# Patient Record
Sex: Female | Born: 1970 | Race: White | Hispanic: No | Smoking: Never smoker
Health system: Southern US, Community
[De-identification: ages and names within clinical notes are randomized; demographics above are authoritative.]

## PROBLEM LIST (undated history)

## (undated) DIAGNOSIS — K859 Acute pancreatitis without necrosis or infection, unspecified: Secondary | ICD-10-CM

## (undated) DIAGNOSIS — O139 Gestational [pregnancy-induced] hypertension without significant proteinuria, unspecified trimester: Secondary | ICD-10-CM

## (undated) DIAGNOSIS — K469 Unspecified abdominal hernia without obstruction or gangrene: Secondary | ICD-10-CM

## (undated) DIAGNOSIS — F3181 Bipolar II disorder: Secondary | ICD-10-CM

## (undated) DIAGNOSIS — M5030 Other cervical disc degeneration, unspecified cervical region: Secondary | ICD-10-CM

## (undated) DIAGNOSIS — K3184 Gastroparesis: Secondary | ICD-10-CM

## (undated) DIAGNOSIS — K1379 Other lesions of oral mucosa: Secondary | ICD-10-CM

## (undated) DIAGNOSIS — K219 Gastro-esophageal reflux disease without esophagitis: Secondary | ICD-10-CM

## (undated) DIAGNOSIS — M5126 Other intervertebral disc displacement, lumbar region: Secondary | ICD-10-CM

## (undated) DIAGNOSIS — J45909 Unspecified asthma, uncomplicated: Secondary | ICD-10-CM

## (undated) DIAGNOSIS — IMO0002 Reserved for concepts with insufficient information to code with codable children: Secondary | ICD-10-CM

## (undated) DIAGNOSIS — K56600 Partial intestinal obstruction, unspecified as to cause: Secondary | ICD-10-CM

## (undated) DIAGNOSIS — E079 Disorder of thyroid, unspecified: Secondary | ICD-10-CM

## (undated) DIAGNOSIS — K589 Irritable bowel syndrome without diarrhea: Secondary | ICD-10-CM

## (undated) DIAGNOSIS — G44209 Tension-type headache, unspecified, not intractable: Secondary | ICD-10-CM

## (undated) HISTORY — PX: GASTRIC BYPASS: SHX52

## (undated) HISTORY — PX: ENDOMETRIAL ABLATION: SHX621

## (undated) HISTORY — PX: TUBAL LIGATION: SHX77

## (undated) HISTORY — PX: BACK SURGERY: SHX140

## (undated) HISTORY — PX: CHOLECYSTECTOMY: SHX55

## (undated) HISTORY — PX: HERNIA REPAIR: SHX51

---

## 1997-09-03 ENCOUNTER — Other Ambulatory Visit: Admission: RE | Admit: 1997-09-03 | Discharge: 1997-09-03 | Payer: Self-pay | Admitting: Obstetrics and Gynecology

## 1997-10-14 ENCOUNTER — Encounter: Admission: RE | Admit: 1997-10-14 | Discharge: 1998-01-12 | Payer: Self-pay | Admitting: Obstetrics and Gynecology

## 1997-12-31 ENCOUNTER — Other Ambulatory Visit: Admission: RE | Admit: 1997-12-31 | Discharge: 1997-12-31 | Payer: Self-pay | Admitting: Gynecology

## 1998-02-03 ENCOUNTER — Inpatient Hospital Stay (HOSPITAL_COMMUNITY): Admission: AD | Admit: 1998-02-03 | Discharge: 1998-02-03 | Payer: Self-pay | Admitting: Obstetrics and Gynecology

## 1998-02-23 ENCOUNTER — Inpatient Hospital Stay (HOSPITAL_COMMUNITY): Admission: AD | Admit: 1998-02-23 | Discharge: 1998-02-23 | Payer: Self-pay | Admitting: Obstetrics and Gynecology

## 1998-03-12 ENCOUNTER — Inpatient Hospital Stay (HOSPITAL_COMMUNITY): Admission: AD | Admit: 1998-03-12 | Discharge: 1998-03-18 | Payer: Self-pay | Admitting: Obstetrics and Gynecology

## 1998-03-18 ENCOUNTER — Encounter (HOSPITAL_COMMUNITY): Admission: RE | Admit: 1998-03-18 | Discharge: 1998-06-16 | Payer: Self-pay | Admitting: Gynecology

## 1998-03-19 ENCOUNTER — Observation Stay (HOSPITAL_COMMUNITY): Admission: AD | Admit: 1998-03-19 | Discharge: 1998-03-20 | Payer: Self-pay | Admitting: Gynecology

## 1998-04-13 ENCOUNTER — Other Ambulatory Visit: Admission: RE | Admit: 1998-04-13 | Discharge: 1998-04-13 | Payer: Self-pay | Admitting: Gynecology

## 1999-05-13 ENCOUNTER — Inpatient Hospital Stay (HOSPITAL_COMMUNITY): Admission: EM | Admit: 1999-05-13 | Discharge: 1999-05-15 | Payer: Self-pay | Admitting: Emergency Medicine

## 1999-05-14 ENCOUNTER — Encounter: Payer: Self-pay | Admitting: General Surgery

## 1999-05-14 ENCOUNTER — Encounter: Payer: Self-pay | Admitting: Endocrinology

## 1999-05-19 ENCOUNTER — Encounter: Payer: Self-pay | Admitting: Internal Medicine

## 1999-05-19 ENCOUNTER — Ambulatory Visit (HOSPITAL_COMMUNITY): Admission: RE | Admit: 1999-05-19 | Discharge: 1999-05-19 | Payer: Self-pay | Admitting: Internal Medicine

## 1999-06-08 ENCOUNTER — Encounter: Payer: Self-pay | Admitting: Gastroenterology

## 1999-06-08 ENCOUNTER — Ambulatory Visit (HOSPITAL_COMMUNITY): Admission: RE | Admit: 1999-06-08 | Discharge: 1999-06-08 | Payer: Self-pay | Admitting: Gastroenterology

## 1999-06-25 ENCOUNTER — Other Ambulatory Visit: Admission: RE | Admit: 1999-06-25 | Discharge: 1999-06-25 | Payer: Self-pay | Admitting: Obstetrics and Gynecology

## 1999-06-30 ENCOUNTER — Encounter (INDEPENDENT_AMBULATORY_CARE_PROVIDER_SITE_OTHER): Payer: Self-pay | Admitting: Specialist

## 1999-06-30 ENCOUNTER — Ambulatory Visit (HOSPITAL_COMMUNITY): Admission: RE | Admit: 1999-06-30 | Discharge: 1999-06-30 | Payer: Self-pay | Admitting: Gastroenterology

## 1999-12-08 ENCOUNTER — Emergency Department (HOSPITAL_COMMUNITY): Admission: EM | Admit: 1999-12-08 | Discharge: 1999-12-08 | Payer: Self-pay | Admitting: Emergency Medicine

## 2000-04-24 ENCOUNTER — Encounter (INDEPENDENT_AMBULATORY_CARE_PROVIDER_SITE_OTHER): Payer: Self-pay

## 2000-04-24 ENCOUNTER — Ambulatory Visit (HOSPITAL_COMMUNITY): Admission: RE | Admit: 2000-04-24 | Discharge: 2000-04-24 | Payer: Self-pay | Admitting: Obstetrics and Gynecology

## 2000-07-10 ENCOUNTER — Other Ambulatory Visit: Admission: RE | Admit: 2000-07-10 | Discharge: 2000-07-10 | Payer: Self-pay | Admitting: Obstetrics and Gynecology

## 2000-10-15 ENCOUNTER — Encounter: Payer: Self-pay | Admitting: Emergency Medicine

## 2000-10-15 ENCOUNTER — Emergency Department (HOSPITAL_COMMUNITY): Admission: EM | Admit: 2000-10-15 | Discharge: 2000-10-15 | Payer: Self-pay | Admitting: Emergency Medicine

## 2001-07-05 ENCOUNTER — Ambulatory Visit: Admission: RE | Admit: 2001-07-05 | Discharge: 2001-07-05 | Payer: Self-pay | Admitting: Internal Medicine

## 2001-09-04 ENCOUNTER — Other Ambulatory Visit: Admission: RE | Admit: 2001-09-04 | Discharge: 2001-09-04 | Payer: Self-pay | Admitting: Gynecology

## 2001-09-13 ENCOUNTER — Ambulatory Visit (HOSPITAL_BASED_OUTPATIENT_CLINIC_OR_DEPARTMENT_OTHER): Admission: RE | Admit: 2001-09-13 | Discharge: 2001-09-13 | Payer: Self-pay | Admitting: *Deleted

## 2001-10-05 ENCOUNTER — Encounter: Admission: RE | Admit: 2001-10-05 | Discharge: 2002-01-03 | Payer: Self-pay | Admitting: Gynecology

## 2001-11-15 ENCOUNTER — Inpatient Hospital Stay (HOSPITAL_COMMUNITY): Admission: AD | Admit: 2001-11-15 | Discharge: 2001-11-15 | Payer: Self-pay | Admitting: Gynecology

## 2001-12-27 ENCOUNTER — Inpatient Hospital Stay (HOSPITAL_COMMUNITY): Admission: AD | Admit: 2001-12-27 | Discharge: 2001-12-27 | Payer: Self-pay | Admitting: Gynecology

## 2002-01-16 ENCOUNTER — Inpatient Hospital Stay (HOSPITAL_COMMUNITY): Admission: AD | Admit: 2002-01-16 | Discharge: 2002-01-16 | Payer: Self-pay | Admitting: Internal Medicine

## 2002-01-24 ENCOUNTER — Inpatient Hospital Stay (HOSPITAL_COMMUNITY): Admission: AD | Admit: 2002-01-24 | Discharge: 2002-01-24 | Payer: Self-pay | Admitting: Gynecology

## 2002-01-28 ENCOUNTER — Encounter (HOSPITAL_COMMUNITY): Admission: RE | Admit: 2002-01-28 | Discharge: 2002-02-14 | Payer: Self-pay | Admitting: Gynecology

## 2002-02-13 ENCOUNTER — Ambulatory Visit (HOSPITAL_COMMUNITY): Admission: RE | Admit: 2002-02-13 | Discharge: 2002-02-13 | Payer: Self-pay | Admitting: Gynecology

## 2002-02-14 ENCOUNTER — Encounter (INDEPENDENT_AMBULATORY_CARE_PROVIDER_SITE_OTHER): Payer: Self-pay | Admitting: *Deleted

## 2002-02-14 ENCOUNTER — Inpatient Hospital Stay (HOSPITAL_COMMUNITY): Admission: AD | Admit: 2002-02-14 | Discharge: 2002-02-18 | Payer: Self-pay | Admitting: Gynecology

## 2002-02-18 ENCOUNTER — Emergency Department (HOSPITAL_COMMUNITY): Admission: EM | Admit: 2002-02-18 | Discharge: 2002-02-19 | Payer: Self-pay | Admitting: Emergency Medicine

## 2002-02-18 ENCOUNTER — Encounter: Payer: Self-pay | Admitting: Emergency Medicine

## 2002-02-20 ENCOUNTER — Emergency Department (HOSPITAL_COMMUNITY): Admission: EM | Admit: 2002-02-20 | Discharge: 2002-02-20 | Payer: Self-pay | Admitting: Emergency Medicine

## 2002-04-02 ENCOUNTER — Other Ambulatory Visit: Admission: RE | Admit: 2002-04-02 | Discharge: 2002-04-02 | Payer: Self-pay | Admitting: Gynecology

## 2002-12-14 ENCOUNTER — Encounter: Payer: Self-pay | Admitting: *Deleted

## 2002-12-14 ENCOUNTER — Ambulatory Visit (HOSPITAL_COMMUNITY): Admission: RE | Admit: 2002-12-14 | Discharge: 2002-12-14 | Payer: Self-pay | Admitting: *Deleted

## 2002-12-27 ENCOUNTER — Encounter: Payer: Self-pay | Admitting: Emergency Medicine

## 2002-12-27 ENCOUNTER — Emergency Department (HOSPITAL_COMMUNITY): Admission: EM | Admit: 2002-12-27 | Discharge: 2002-12-27 | Payer: Self-pay | Admitting: Emergency Medicine

## 2003-01-02 ENCOUNTER — Observation Stay (HOSPITAL_COMMUNITY): Admission: RE | Admit: 2003-01-02 | Discharge: 2003-01-05 | Payer: Self-pay | Admitting: Specialist

## 2003-01-02 ENCOUNTER — Encounter: Payer: Self-pay | Admitting: Specialist

## 2003-01-03 ENCOUNTER — Encounter (INDEPENDENT_AMBULATORY_CARE_PROVIDER_SITE_OTHER): Payer: Self-pay | Admitting: Specialist

## 2003-01-04 ENCOUNTER — Encounter: Payer: Self-pay | Admitting: Specialist

## 2004-07-05 ENCOUNTER — Ambulatory Visit (HOSPITAL_COMMUNITY): Admission: RE | Admit: 2004-07-05 | Discharge: 2004-07-05 | Payer: Self-pay | Admitting: Internal Medicine

## 2004-09-20 ENCOUNTER — Other Ambulatory Visit: Admission: RE | Admit: 2004-09-20 | Discharge: 2004-09-20 | Payer: Self-pay | Admitting: Gynecology

## 2005-04-05 ENCOUNTER — Encounter: Admission: RE | Admit: 2005-04-05 | Discharge: 2005-04-05 | Payer: Self-pay | Admitting: Otolaryngology

## 2005-06-15 ENCOUNTER — Ambulatory Visit: Payer: Self-pay | Admitting: Internal Medicine

## 2005-07-25 ENCOUNTER — Emergency Department (HOSPITAL_COMMUNITY): Admission: EM | Admit: 2005-07-25 | Discharge: 2005-07-25 | Payer: Self-pay | Admitting: Emergency Medicine

## 2005-08-15 ENCOUNTER — Ambulatory Visit: Payer: Self-pay | Admitting: Internal Medicine

## 2005-11-18 ENCOUNTER — Ambulatory Visit (HOSPITAL_COMMUNITY): Payer: Self-pay | Admitting: Psychiatry

## 2005-12-19 ENCOUNTER — Ambulatory Visit (HOSPITAL_COMMUNITY): Payer: Self-pay | Admitting: Psychiatry

## 2006-01-11 ENCOUNTER — Other Ambulatory Visit: Admission: RE | Admit: 2006-01-11 | Discharge: 2006-01-11 | Payer: Self-pay | Admitting: Gynecology

## 2006-01-27 ENCOUNTER — Ambulatory Visit (HOSPITAL_COMMUNITY): Admission: RE | Admit: 2006-01-27 | Discharge: 2006-01-27 | Payer: Self-pay | Admitting: Gynecology

## 2006-03-08 ENCOUNTER — Ambulatory Visit (HOSPITAL_COMMUNITY): Payer: Self-pay | Admitting: Psychiatry

## 2006-04-12 ENCOUNTER — Ambulatory Visit (HOSPITAL_COMMUNITY): Payer: Self-pay | Admitting: Psychiatry

## 2006-05-10 ENCOUNTER — Ambulatory Visit (HOSPITAL_COMMUNITY): Payer: Self-pay | Admitting: Psychiatry

## 2006-06-14 ENCOUNTER — Ambulatory Visit (HOSPITAL_COMMUNITY): Payer: Self-pay | Admitting: Psychiatry

## 2006-07-19 ENCOUNTER — Ambulatory Visit (HOSPITAL_COMMUNITY): Payer: Self-pay | Admitting: Psychiatry

## 2006-09-06 ENCOUNTER — Ambulatory Visit (HOSPITAL_COMMUNITY): Payer: Self-pay | Admitting: Psychiatry

## 2006-09-27 ENCOUNTER — Ambulatory Visit (HOSPITAL_COMMUNITY): Payer: Self-pay | Admitting: Psychiatry

## 2006-12-06 ENCOUNTER — Other Ambulatory Visit: Admission: RE | Admit: 2006-12-06 | Discharge: 2006-12-06 | Payer: Self-pay | Admitting: Gynecology

## 2006-12-11 ENCOUNTER — Ambulatory Visit (HOSPITAL_COMMUNITY): Payer: Self-pay | Admitting: Psychiatry

## 2006-12-27 ENCOUNTER — Emergency Department (HOSPITAL_COMMUNITY): Admission: EM | Admit: 2006-12-27 | Discharge: 2006-12-27 | Payer: Self-pay | Admitting: Emergency Medicine

## 2007-01-11 ENCOUNTER — Emergency Department (HOSPITAL_COMMUNITY): Admission: EM | Admit: 2007-01-11 | Discharge: 2007-01-11 | Payer: Self-pay | Admitting: Emergency Medicine

## 2007-01-18 ENCOUNTER — Emergency Department (HOSPITAL_COMMUNITY): Admission: EM | Admit: 2007-01-18 | Discharge: 2007-01-18 | Payer: Self-pay | Admitting: Emergency Medicine

## 2007-03-27 ENCOUNTER — Emergency Department (HOSPITAL_COMMUNITY): Admission: EM | Admit: 2007-03-27 | Discharge: 2007-03-28 | Payer: Self-pay | Admitting: *Deleted

## 2007-08-05 ENCOUNTER — Ambulatory Visit (HOSPITAL_COMMUNITY): Admission: RE | Admit: 2007-08-05 | Discharge: 2007-08-05 | Payer: Self-pay | Admitting: Family Medicine

## 2007-08-09 ENCOUNTER — Ambulatory Visit: Payer: Self-pay | Admitting: Internal Medicine

## 2007-08-09 LAB — CONVERTED CEMR LAB
ALT: 20 units/L (ref 0–35)
AST: 14 units/L (ref 0–37)
Albumin: 4.1 g/dL (ref 3.5–5.2)
Alkaline Phosphatase: 56 units/L (ref 39–117)
Glucose, Bld: 91 mg/dL (ref 70–99)
Iron: 34 ug/dL — ABNORMAL LOW (ref 42–145)
MCHC: 32.3 g/dL (ref 30.0–36.0)
Platelets: 366 10*3/uL (ref 150–400)
Potassium: 4.6 meq/L (ref 3.5–5.3)
RBC: 4.82 M/uL (ref 3.87–5.11)
Sodium: 140 meq/L (ref 135–145)
TIBC: 401 ug/dL (ref 250–470)
Total Bilirubin: 0.2 mg/dL — ABNORMAL LOW (ref 0.3–1.2)
Total Protein: 6.5 g/dL (ref 6.0–8.3)
UIBC: 367 ug/dL

## 2007-08-10 ENCOUNTER — Ambulatory Visit: Payer: Self-pay | Admitting: Internal Medicine

## 2007-08-22 ENCOUNTER — Ambulatory Visit: Payer: Self-pay | Admitting: Internal Medicine

## 2007-08-31 ENCOUNTER — Ambulatory Visit: Payer: Self-pay | Admitting: *Deleted

## 2007-10-19 ENCOUNTER — Ambulatory Visit: Payer: Self-pay | Admitting: Internal Medicine

## 2007-10-19 ENCOUNTER — Encounter: Payer: Self-pay | Admitting: Family Medicine

## 2007-10-19 LAB — CONVERTED CEMR LAB: Chlamydia, DNA Probe: NEGATIVE

## 2007-10-22 ENCOUNTER — Encounter: Payer: Self-pay | Admitting: Family Medicine

## 2007-11-05 ENCOUNTER — Emergency Department (HOSPITAL_COMMUNITY): Admission: EM | Admit: 2007-11-05 | Discharge: 2007-11-05 | Payer: Self-pay | Admitting: Emergency Medicine

## 2007-11-06 ENCOUNTER — Ambulatory Visit: Payer: Self-pay | Admitting: Internal Medicine

## 2008-01-04 ENCOUNTER — Emergency Department (HOSPITAL_COMMUNITY): Admission: EM | Admit: 2008-01-04 | Discharge: 2008-01-04 | Payer: Self-pay | Admitting: Emergency Medicine

## 2008-01-11 ENCOUNTER — Encounter: Payer: Self-pay | Admitting: Internal Medicine

## 2008-01-11 ENCOUNTER — Inpatient Hospital Stay (HOSPITAL_COMMUNITY): Admission: EM | Admit: 2008-01-11 | Discharge: 2008-01-18 | Payer: Self-pay | Admitting: Emergency Medicine

## 2008-01-16 ENCOUNTER — Encounter: Payer: Self-pay | Admitting: Internal Medicine

## 2008-01-18 ENCOUNTER — Encounter: Payer: Self-pay | Admitting: Internal Medicine

## 2008-01-24 ENCOUNTER — Ambulatory Visit: Payer: Self-pay | Admitting: Internal Medicine

## 2008-01-30 ENCOUNTER — Telehealth: Payer: Self-pay | Admitting: Internal Medicine

## 2008-02-04 ENCOUNTER — Telehealth: Payer: Self-pay | Admitting: Internal Medicine

## 2008-02-13 ENCOUNTER — Ambulatory Visit: Payer: Self-pay | Admitting: Internal Medicine

## 2008-02-13 ENCOUNTER — Telehealth: Payer: Self-pay | Admitting: Internal Medicine

## 2008-02-13 DIAGNOSIS — D649 Anemia, unspecified: Secondary | ICD-10-CM | POA: Insufficient documentation

## 2008-02-13 DIAGNOSIS — K589 Irritable bowel syndrome without diarrhea: Secondary | ICD-10-CM

## 2008-02-13 DIAGNOSIS — R109 Unspecified abdominal pain: Secondary | ICD-10-CM

## 2008-02-14 ENCOUNTER — Telehealth (INDEPENDENT_AMBULATORY_CARE_PROVIDER_SITE_OTHER): Payer: Self-pay

## 2008-02-14 LAB — CONVERTED CEMR LAB
ALT: 22 units/L (ref 0–35)
BUN: 10 mg/dL (ref 6–23)
Basophils Absolute: 0 10*3/uL (ref 0.0–0.1)
Basophils Relative: 0.8 % (ref 0.0–3.0)
Bilirubin Urine: NEGATIVE
CO2: 30 meq/L (ref 19–32)
Calcium: 9.2 mg/dL (ref 8.4–10.5)
Chloride: 113 meq/L — ABNORMAL HIGH (ref 96–112)
Creatinine, Ser: 0.6 mg/dL (ref 0.4–1.2)
GFR calc Af Amer: 143 mL/min
HCT: 40.1 % (ref 36.0–46.0)
Hemoglobin, Urine: NEGATIVE
Hemoglobin: 13.2 g/dL (ref 12.0–15.0)
Iron: 97 ug/dL (ref 42–145)
Ketones, ur: NEGATIVE mg/dL
Leukocytes, UA: NEGATIVE
MCHC: 32.8 g/dL (ref 30.0–36.0)
MCV: 90.1 fL (ref 78.0–100.0)
Monocytes Absolute: 0.5 10*3/uL (ref 0.1–1.0)
Mucus, UA: NEGATIVE
Neutro Abs: 2.6 10*3/uL (ref 1.4–7.7)
Nitrite: NEGATIVE
RBC: 4.45 M/uL (ref 3.87–5.11)
RDW: 13.8 % (ref 11.5–14.6)
Saturation Ratios: 20.3 % (ref 20.0–50.0)
Total Bilirubin: 0.5 mg/dL (ref 0.3–1.2)
Transferrin: 342 mg/dL (ref >=212.0)
pH: 6.5 (ref 5.0–8.0)

## 2008-02-20 ENCOUNTER — Telehealth: Payer: Self-pay | Admitting: Internal Medicine

## 2008-03-11 ENCOUNTER — Telehealth: Payer: Self-pay | Admitting: Internal Medicine

## 2008-03-16 ENCOUNTER — Emergency Department (HOSPITAL_COMMUNITY): Admission: EM | Admit: 2008-03-16 | Discharge: 2008-03-17 | Payer: Self-pay | Admitting: Emergency Medicine

## 2008-03-17 ENCOUNTER — Ambulatory Visit: Payer: Self-pay | Admitting: Internal Medicine

## 2008-03-17 DIAGNOSIS — Z9884 Bariatric surgery status: Secondary | ICD-10-CM | POA: Insufficient documentation

## 2008-03-18 ENCOUNTER — Telehealth: Payer: Self-pay | Admitting: Internal Medicine

## 2008-03-19 ENCOUNTER — Emergency Department (HOSPITAL_COMMUNITY): Admission: EM | Admit: 2008-03-19 | Discharge: 2008-03-19 | Payer: Self-pay | Admitting: Family Medicine

## 2008-04-18 ENCOUNTER — Emergency Department (HOSPITAL_COMMUNITY): Admission: EM | Admit: 2008-04-18 | Discharge: 2008-04-18 | Payer: Self-pay | Admitting: Family Medicine

## 2008-04-27 ENCOUNTER — Emergency Department (HOSPITAL_COMMUNITY): Admission: EM | Admit: 2008-04-27 | Discharge: 2008-04-27 | Payer: Self-pay | Admitting: Family Medicine

## 2008-05-13 ENCOUNTER — Telehealth: Payer: Self-pay | Admitting: Internal Medicine

## 2008-05-15 ENCOUNTER — Telehealth: Payer: Self-pay | Admitting: Internal Medicine

## 2008-05-16 ENCOUNTER — Encounter: Payer: Self-pay | Admitting: Nurse Practitioner

## 2008-05-16 ENCOUNTER — Ambulatory Visit: Payer: Self-pay | Admitting: Internal Medicine

## 2008-05-16 ENCOUNTER — Telehealth: Payer: Self-pay | Admitting: Nurse Practitioner

## 2008-05-16 ENCOUNTER — Ambulatory Visit: Payer: Self-pay | Admitting: Gastroenterology

## 2008-05-16 LAB — CONVERTED CEMR LAB
BUN: 10 mg/dL (ref 6–23)
Basophils Absolute: 0 10*3/uL (ref 0.0–0.1)
Basophils Relative: 0.9 % (ref 0.0–3.0)
Calcium: 9.3 mg/dL (ref 8.4–10.5)
Chloride: 102 meq/L (ref 96–112)
Creatinine, Ser: 0.7 mg/dL (ref 0.4–1.2)
Eosinophils Absolute: 0.1 10*3/uL (ref 0.0–0.7)
Eosinophils Relative: 2.3 % (ref 0.0–5.0)
GFR calc Af Amer: 120 mL/min
GFR calc non Af Amer: 99 mL/min
HCT: 39.2 % (ref 36.0–46.0)
Hemoglobin: 13.6 g/dL (ref 12.0–15.0)
MCHC: 34.7 g/dL (ref 30.0–36.0)
MCV: 92.7 fL (ref 78.0–100.0)
Monocytes Absolute: 0.3 10*3/uL (ref 0.1–1.0)
Neutro Abs: 3.5 10*3/uL (ref 1.4–7.7)
RBC: 4.23 M/uL (ref 3.87–5.11)
WBC: 5.3 10*3/uL (ref 4.5–10.5)

## 2008-05-19 ENCOUNTER — Telehealth: Payer: Self-pay | Admitting: Internal Medicine

## 2008-05-20 ENCOUNTER — Telehealth: Payer: Self-pay | Admitting: Internal Medicine

## 2008-05-20 ENCOUNTER — Ambulatory Visit: Payer: Self-pay | Admitting: Internal Medicine

## 2008-05-21 ENCOUNTER — Encounter: Payer: Self-pay | Admitting: Internal Medicine

## 2008-06-02 ENCOUNTER — Telehealth: Payer: Self-pay | Admitting: Internal Medicine

## 2008-06-10 ENCOUNTER — Ambulatory Visit: Payer: Self-pay | Admitting: Internal Medicine

## 2008-06-10 DIAGNOSIS — L989 Disorder of the skin and subcutaneous tissue, unspecified: Secondary | ICD-10-CM | POA: Insufficient documentation

## 2008-06-10 DIAGNOSIS — K912 Postsurgical malabsorption, not elsewhere classified: Secondary | ICD-10-CM | POA: Insufficient documentation

## 2008-06-11 LAB — CONVERTED CEMR LAB
BUN: 8 mg/dL (ref 6–23)
Calcium: 8.9 mg/dL (ref 8.4–10.5)
Chloride: 103 meq/L (ref 96–112)
Creatinine, Ser: 0.6 mg/dL (ref 0.4–1.2)
GFR calc non Af Amer: 118 mL/min

## 2008-06-27 ENCOUNTER — Ambulatory Visit: Payer: Self-pay | Admitting: Family Medicine

## 2008-07-10 ENCOUNTER — Ambulatory Visit: Payer: Self-pay | Admitting: Internal Medicine

## 2008-07-11 ENCOUNTER — Ambulatory Visit: Payer: Self-pay | Admitting: Family Medicine

## 2008-07-14 ENCOUNTER — Ambulatory Visit: Payer: Self-pay | Admitting: Family Medicine

## 2008-07-24 ENCOUNTER — Emergency Department (HOSPITAL_COMMUNITY): Admission: EM | Admit: 2008-07-24 | Discharge: 2008-07-24 | Payer: Self-pay | Admitting: Emergency Medicine

## 2008-08-09 ENCOUNTER — Emergency Department (HOSPITAL_COMMUNITY): Admission: EM | Admit: 2008-08-09 | Discharge: 2008-08-09 | Payer: Self-pay | Admitting: Emergency Medicine

## 2008-10-17 ENCOUNTER — Emergency Department (HOSPITAL_COMMUNITY): Admission: EM | Admit: 2008-10-17 | Discharge: 2008-10-17 | Payer: Self-pay | Admitting: Family Medicine

## 2008-12-10 ENCOUNTER — Telehealth: Payer: Self-pay | Admitting: Internal Medicine

## 2008-12-11 ENCOUNTER — Telehealth: Payer: Self-pay | Admitting: Internal Medicine

## 2009-02-01 ENCOUNTER — Emergency Department (HOSPITAL_COMMUNITY): Admission: EM | Admit: 2009-02-01 | Discharge: 2009-02-01 | Payer: Self-pay | Admitting: Emergency Medicine

## 2009-04-20 ENCOUNTER — Ambulatory Visit: Payer: Self-pay | Admitting: Internal Medicine

## 2009-04-21 ENCOUNTER — Telehealth (INDEPENDENT_AMBULATORY_CARE_PROVIDER_SITE_OTHER): Payer: Self-pay | Admitting: *Deleted

## 2009-04-23 ENCOUNTER — Telehealth (INDEPENDENT_AMBULATORY_CARE_PROVIDER_SITE_OTHER): Payer: Self-pay | Admitting: *Deleted

## 2009-05-12 ENCOUNTER — Emergency Department (HOSPITAL_COMMUNITY): Admission: EM | Admit: 2009-05-12 | Discharge: 2009-05-12 | Payer: Self-pay | Admitting: Family Medicine

## 2009-05-23 ENCOUNTER — Emergency Department (HOSPITAL_COMMUNITY): Admission: EM | Admit: 2009-05-23 | Discharge: 2009-05-23 | Payer: Self-pay | Admitting: Family Medicine

## 2009-05-24 ENCOUNTER — Emergency Department (HOSPITAL_COMMUNITY): Admission: EM | Admit: 2009-05-24 | Discharge: 2009-05-24 | Payer: Self-pay | Admitting: Emergency Medicine

## 2009-05-28 ENCOUNTER — Ambulatory Visit: Payer: Self-pay | Admitting: Family Medicine

## 2009-05-28 ENCOUNTER — Ambulatory Visit (HOSPITAL_COMMUNITY): Admission: RE | Admit: 2009-05-28 | Discharge: 2009-05-28 | Payer: Self-pay | Admitting: Family Medicine

## 2009-06-02 ENCOUNTER — Emergency Department (HOSPITAL_COMMUNITY): Admission: EM | Admit: 2009-06-02 | Discharge: 2009-06-02 | Payer: Self-pay | Admitting: Family Medicine

## 2009-06-09 ENCOUNTER — Ambulatory Visit: Payer: Self-pay | Admitting: Family Medicine

## 2009-06-18 ENCOUNTER — Encounter (INDEPENDENT_AMBULATORY_CARE_PROVIDER_SITE_OTHER): Payer: Self-pay | Admitting: Family Medicine

## 2009-06-18 ENCOUNTER — Ambulatory Visit: Payer: Self-pay | Admitting: Family Medicine

## 2009-06-18 LAB — CONVERTED CEMR LAB
ANA Titer 1: 1:320 {titer} — ABNORMAL HIGH
Anti Nuclear Antibody(ANA): POSITIVE — AB
BUN: 14 mg/dL (ref 6–23)
Basophils Relative: 1 % (ref 0–1)
CO2: 25 meq/L (ref 19–32)
Calcium: 9.1 mg/dL (ref 8.4–10.5)
Chloride: 105 meq/L (ref 96–112)
Creatinine, Ser: 0.55 mg/dL (ref 0.40–1.20)
Eosinophils Absolute: 0.1 10*3/uL (ref 0.0–0.7)
Hemoglobin: 13.9 g/dL (ref 12.0–15.0)
MCHC: 33.3 g/dL (ref 30.0–36.0)
MCV: 89.1 fL (ref 78.0–100.0)
Monocytes Absolute: 0.4 10*3/uL (ref 0.1–1.0)
Monocytes Relative: 7 % (ref 3–12)
Neutrophils Relative %: 62 % (ref 43–77)
RBC: 4.68 M/uL (ref 3.87–5.11)
RDW: 12.9 % (ref 11.5–15.5)
Rhuematoid fact SerPl-aCnc: <20 intl units/mL (ref 0–20)
TSH: 0.897 microintl units/mL (ref 0.350–4.500)

## 2009-06-23 ENCOUNTER — Ambulatory Visit (HOSPITAL_COMMUNITY): Admission: RE | Admit: 2009-06-23 | Discharge: 2009-06-23 | Payer: Self-pay | Admitting: Internal Medicine

## 2009-09-09 ENCOUNTER — Emergency Department (HOSPITAL_COMMUNITY): Admission: EM | Admit: 2009-09-09 | Discharge: 2009-09-09 | Payer: Self-pay | Admitting: Emergency Medicine

## 2009-09-09 ENCOUNTER — Encounter: Payer: Self-pay | Admitting: Physician Assistant

## 2009-09-11 ENCOUNTER — Emergency Department (HOSPITAL_COMMUNITY): Admission: EM | Admit: 2009-09-11 | Discharge: 2009-09-11 | Payer: Self-pay | Admitting: Emergency Medicine

## 2009-09-14 ENCOUNTER — Ambulatory Visit: Payer: Self-pay | Admitting: Gastroenterology

## 2009-09-14 ENCOUNTER — Telehealth: Payer: Self-pay | Admitting: Internal Medicine

## 2009-09-14 DIAGNOSIS — E669 Obesity, unspecified: Secondary | ICD-10-CM

## 2009-09-14 DIAGNOSIS — R1084 Generalized abdominal pain: Secondary | ICD-10-CM

## 2009-09-14 DIAGNOSIS — K5900 Constipation, unspecified: Secondary | ICD-10-CM | POA: Insufficient documentation

## 2009-09-14 DIAGNOSIS — R110 Nausea: Secondary | ICD-10-CM

## 2009-09-14 DIAGNOSIS — R143 Flatulence: Secondary | ICD-10-CM

## 2009-09-14 DIAGNOSIS — R141 Gas pain: Secondary | ICD-10-CM | POA: Insufficient documentation

## 2009-09-14 DIAGNOSIS — R112 Nausea with vomiting, unspecified: Secondary | ICD-10-CM | POA: Insufficient documentation

## 2009-09-14 DIAGNOSIS — R142 Eructation: Secondary | ICD-10-CM

## 2009-09-17 LAB — CONVERTED CEMR LAB
AST: 26 units/L (ref 0–37)
Alkaline Phosphatase: 55 units/L (ref 39–117)
BUN: 7 mg/dL (ref 6–23)
Basophils Relative: 0.9 % (ref 0.0–3.0)
Calcium: 8.6 mg/dL (ref 8.4–10.5)
Creatinine, Ser: 0.5 mg/dL (ref 0.4–1.2)
Eosinophils Relative: 1.6 % (ref 0.0–5.0)
HCT: 39 % (ref 36.0–46.0)
MCV: 89.8 fL (ref 78.0–100.0)
Monocytes Absolute: 0.3 10*3/uL (ref 0.1–1.0)
Neutrophils Relative %: 60 % (ref 43.0–77.0)
RBC: 4.35 M/uL (ref 3.87–5.11)
WBC: 4.3 10*3/uL — ABNORMAL LOW (ref 4.5–10.5)

## 2009-09-23 ENCOUNTER — Telehealth: Payer: Self-pay | Admitting: Internal Medicine

## 2009-10-19 ENCOUNTER — Emergency Department (HOSPITAL_COMMUNITY): Admission: EM | Admit: 2009-10-19 | Discharge: 2009-10-19 | Payer: Self-pay | Admitting: Emergency Medicine

## 2009-10-29 ENCOUNTER — Emergency Department (HOSPITAL_COMMUNITY): Admission: EM | Admit: 2009-10-29 | Discharge: 2009-10-29 | Payer: Self-pay | Admitting: Emergency Medicine

## 2009-10-29 ENCOUNTER — Telehealth: Payer: Self-pay | Admitting: Internal Medicine

## 2009-11-01 ENCOUNTER — Emergency Department (HOSPITAL_COMMUNITY): Admission: EM | Admit: 2009-11-01 | Discharge: 2009-11-01 | Payer: Self-pay | Admitting: Emergency Medicine

## 2009-11-05 ENCOUNTER — Telehealth: Payer: Self-pay | Admitting: Internal Medicine

## 2009-11-06 ENCOUNTER — Emergency Department (HOSPITAL_COMMUNITY): Admission: EM | Admit: 2009-11-06 | Discharge: 2009-11-06 | Payer: Self-pay | Admitting: Emergency Medicine

## 2009-11-14 ENCOUNTER — Ambulatory Visit: Payer: Self-pay | Admitting: Gastroenterology

## 2009-11-17 ENCOUNTER — Inpatient Hospital Stay (HOSPITAL_COMMUNITY): Admission: EM | Admit: 2009-11-17 | Discharge: 2009-11-23 | Payer: Self-pay | Admitting: Emergency Medicine

## 2009-11-18 ENCOUNTER — Encounter (INDEPENDENT_AMBULATORY_CARE_PROVIDER_SITE_OTHER): Payer: Self-pay | Admitting: Internal Medicine

## 2010-02-04 ENCOUNTER — Emergency Department (HOSPITAL_COMMUNITY): Admission: EM | Admit: 2010-02-04 | Discharge: 2010-02-04 | Payer: Self-pay | Admitting: Emergency Medicine

## 2010-04-23 ENCOUNTER — Emergency Department (HOSPITAL_COMMUNITY): Admission: EM | Admit: 2010-04-23 | Discharge: 2010-04-23 | Payer: Self-pay | Admitting: Emergency Medicine

## 2010-04-24 ENCOUNTER — Emergency Department (HOSPITAL_BASED_OUTPATIENT_CLINIC_OR_DEPARTMENT_OTHER): Admission: EM | Admit: 2010-04-24 | Discharge: 2010-04-24 | Payer: Self-pay | Admitting: Emergency Medicine

## 2010-04-24 ENCOUNTER — Ambulatory Visit: Payer: Self-pay | Admitting: Diagnostic Radiology

## 2010-06-01 ENCOUNTER — Emergency Department (HOSPITAL_BASED_OUTPATIENT_CLINIC_OR_DEPARTMENT_OTHER)
Admission: EM | Admit: 2010-06-01 | Discharge: 2010-06-01 | Payer: Self-pay | Source: Home / Self Care | Admitting: Emergency Medicine

## 2010-06-01 ENCOUNTER — Encounter: Payer: Self-pay | Admitting: Pulmonary Disease

## 2010-06-26 ENCOUNTER — Encounter: Payer: Self-pay | Admitting: Gastroenterology

## 2010-06-27 ENCOUNTER — Encounter: Payer: Self-pay | Admitting: Internal Medicine

## 2010-06-27 ENCOUNTER — Encounter: Payer: Self-pay | Admitting: Pulmonary Disease

## 2010-06-28 ENCOUNTER — Encounter: Payer: Self-pay | Admitting: Pulmonary Disease

## 2010-06-28 ENCOUNTER — Ambulatory Visit
Admission: RE | Admit: 2010-06-28 | Discharge: 2010-06-28 | Payer: Self-pay | Source: Home / Self Care | Attending: Pulmonary Disease | Admitting: Pulmonary Disease

## 2010-06-28 DIAGNOSIS — R0609 Other forms of dyspnea: Secondary | ICD-10-CM | POA: Insufficient documentation

## 2010-06-28 DIAGNOSIS — R0989 Other specified symptoms and signs involving the circulatory and respiratory systems: Secondary | ICD-10-CM

## 2010-06-28 DIAGNOSIS — IMO0002 Reserved for concepts with insufficient information to code with codable children: Secondary | ICD-10-CM | POA: Insufficient documentation

## 2010-06-28 DIAGNOSIS — O99810 Abnormal glucose complicating pregnancy: Secondary | ICD-10-CM | POA: Insufficient documentation

## 2010-07-08 NOTE — Assessment & Plan Note (Signed)
Summary: constipation/fecal impaction/post ER visit/sheri   History of Present Illness Visit Type: Follow-up Visit Primary GI MD: Stan Head MD [facility] Primary Provider: Melonie Florida Requesting Provider: n/a Chief Complaint: constipation  ER f/u no better, nausea History of Present Illness:   40 YO FEMALE KNOWN TO DR. Leone Payor.  LAST SEEN IN 2009. SHE IS S/P GASTRIC BYPASS WITH GASTROJEJUNOSTOMY IN 2005, HAS APPARENT REMOTE HX OF LYMPHOCYTIC COLITIS. SHE WAS HOSPITALIZED BRIEFLY IN 2009 WITH  ABDOMINAL PAIN, AND A DILATED CBD-INTRA AND EXTRA HEPATIC.;?MILD PANCREATITIS. SHE WAS REFERRED TO [facility] BUT ACCORDING TO RECORDS NO ERCP WAS DONE. SHE COMES IN NOW WITH ONE WEEK HX OF ABDOMINAL BLOATING AND DISTENTION,INTERMITTENT NAUSEA AND VOMITING. SHE RELATES CHRONIC EXPLOSIVE DIARRHEA-USUALLY 4 X DAILY.SHE HAS HAD A SIGNIFICANT DECREASE IN BM'S, AND FEELS CONSTIATED. SHE WAS SEEN IN THE ER, 4/6 AND 4/8-TOLD SHE WAS CONSTIPATED BOTH TIMES AND TRIED MAG CITRATE X 2 WITHOUT MUCH SOLID STOOL.SHE IS STILL NAUSEATED. HAD BEEN USING VICODEN FOR HER ABDOMINAL DISCOMFORT.SHE RELATES ABDOMINAL PRESSURE, OCCASIONAL STABBING DISCOMFORT, NOT WELL LOCALIZED. CT SCAN DONE THRU THE ER ON 4/6 LARGE VOLUME OF STOOL IN COLON, SCANT AMT PRESACRAL FLUID, CBD (PREVIOUSLY ).   GI Review of Systems    Reports abdominal pain, bloating, loss of appetite, nausea, and  vomiting.     Location of  Abdominal pain: generalized.    Denies acid reflux, belching, chest pain, dysphagia with liquids, dysphagia with solids, heartburn, vomiting blood, and  weight loss.      Reports change in bowel habits and  diarrhea.     Denies anal fissure, black tarry stools, diverticulosis, fecal incontinence, heme positive stool, hemorrhoids, irritable bowel syndrome, jaundice, light color stool, liver problems, rectal bleeding, and  rectal pain.   Current Medications (verified): 1)  Multivitamins   Tabs (Multiple Vitamin) ....  Take 2 Daily 2)  Calcium/magnesium/zinc  Tabs (Multiple Minerals) .... Once Daily 3)  Xanax 1 Mg Tabs (Alprazolam) .... Four Times A Day As Needed For Sleep 4)  Adderall 20 Mg Tabs (Amphetamine-Dextroamphetamine) .... Three Times A Day 5)  Cyanocobalamin 1000 Mcg/ml Inj Soln (Cyanocobalamin) .Marland Kitchen.. 1000 Ug Im Monthly, With Tuberculin Syringe, 1 Cc Vial 6)  Strattera 100 Mg Caps (Atomoxetine Hcl) .Marland Kitchen.. 1 By Mouth Once Daily 7)  Zofran 8 Mg Tabs (Ondansetron Hcl) .... As Needed 8)  Vicodin 5-500 Mg Tabs (Hydrocodone-Acetaminophen) .... As Needed  Allergies: 1)  ! Morphine  Past History:  Past Medical History: Anxiety Disorder Asthma Depression Obesity Seizures Sleep Apnea (resolved) Urinary Tract Infection Lymphocytic colitis-remote ADHD Dilated bile duct, pain and abnormal LFT's 8//09 (resolved) IBS  Past Surgical History: Hysteroscopy with Endometrial Ablation 2007 Gastric Bypass, Roux-en-Y, 2005 Back surgery x 2 Cesarean section x 2 Hernia repair 2005 Re-exploration 2008  by Dr. Lily Peer, negative evaluations with EGD, puch full of vegetable matter 2008  Family History: Reviewed history from 02/13/2008 and no changes required. Family History of Colitis/Crohn's: Family History of Colon Polyps: Family History of Diabetes:  Family History of Heart Disease: father with MI in 23's   Social History: Reviewed history from 02/13/2008 and no changes required. 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  Alcohol Use - no Illicit Drug Use - no Daily Caffeine Use 2-3/day  Review of Systems  The patient denies allergy/sinus, anemia, anxiety-new, arthritis/joint pain, back pain, blood in urine, breast changes/lumps, change in vision, confusion, cough, coughing up blood, depression-new, fainting, fatigue, fever, headaches-new, hearing problems, heart murmur, heart  rhythm changes, itching, menstrual pain, muscle pains/cramps, night sweats,  nosebleeds, pregnancy symptoms, shortness of breath, skin rash, sleeping problems, sore throat, swelling of feet/legs, swollen lymph glands, thirst - excessive , urination - excessive , urination changes/pain, urine leakage, vision changes, and voice change.         ROS OTHERWISE AS IN HPI  Vital Signs:  Patient profile:   40 year old female Height:      62 inches Weight:      126 pounds BMI:     23.13 Pulse rate:   88 / minute Pulse rhythm:   regular BP sitting:   126 / 70  (left arm)  Vitals Entered By: Chales Abrahams CMA Duncan Dull) (September 14, 2009 3:44 PM)  Physical Exam  General:  Well developed, well nourished, no acute distress. Head:  Normocephalic and atraumatic. Eyes:  PERRLA, no icterus. Lungs:  Clear throughout to auscultation. Heart:  Regular rate and rhythm; no murmurs, rubs,  or bruits. Abdomen:  SOMEWHAT PROTUBERANT, NONTENSE, MILD RATHER DIFFUSE TENDERNESS,BS+, NO MASS OR HSM  Rectal:  NO IMPACTION, STOOL HEME NEGATIVE Extremities:  No clubbing, cyanosis, edema or deformities noted. Neurologic:  Alert and  oriented x4;  grossly normal neurologically. Psych:  Alert and cooperative. Normal mood and affect.anxious.    Impression & Recommendations:  Problem # 1:  CONSTIPATION (ICD-564.00) Assessment New 40 YO FEMALE  S/P GASTROJEJUNOSTOMY/GASTRIC BYPASS WITH C/O OBSTIPATION, BLOATING, ABDOMINAL PAIN, NAUSEA. SUSPECT MOST OF HER SXS ARE SECONDARY TO SIGNIFICANT CONSTIPATION, WILL R/O PANCREATITIS, BILIARY PAIN. CONTINUE ZOFRAN 8 MG Q 6 HOURS AS NEEDED START MIRALAX PURGE WITH 4-5  DOSES OF MIRIALAX DAILY X 2-3- DAYS UNTIL CLEAR THEN MIRALAX 17 GM DAILY AS NEEDED. PT ASKS FOR MORE VICODEN-SHE IS AWARE IT WILL MAKE CONSTIPATION WORSE BUT SAYS SHE CANNOT REST DUE TO DISCOMFORT-WILL ALLOW LIMITED SUPPLY-ADVISED TO USE SPARINGLY. FOLLOW UP WITH DR. Leone Payor IN 2 WEEKS ANUSOL HC as needed RECTAL DISCOMFORT. Orders: TLB-CMP (Comprehensive Metabolic Pnl) (80053-[id number]) TLB-Amylase  (82150-AMYL) TLB-CBC Platelet - w/Differential (85025-CBCD) TLB-Lipase (83690-LIPASE)  Problem # 2:  NAUSEA AND VOMITING (ICD-787.01) Assessment: Comment Only SEE ABOVE  Problem # 3:  PRIOR LAPAROSCOPIC ROUX-EN-BARIATRIC SURGERY (ICD-V45.86) Assessment: Comment Only  Problem # 4:  IRRITABLE BOWEL SYNDROME (ICD-564.1) Assessment: Comment Only  Problem # 5:  ANXIETY (ICD-300.00) Assessment: Comment Only  Patient Instructions: 1)  Your physician has requested that you have the following labwork done today: 2)  We have given you prescriptions to take to y our pharmacy for Vicodin and Zofran 8 Mg.  3)  Take 5 doses  of Miralax , mix 1 dose in 8 oz of water Tues 4-12. 4)  Take 5 doses of Miralax Wed 4-13, contiune this until you have had a substantial  bowel movement. 5)  The medication list was reviewed and reconciled.  All changed / newly prescribed medications were explained.  A complete medication list was provided to the patient / caregiver.  Prescriptions: VICODIN 5-500 MG TABS (HYDROCODONE-ACETAMINOPHEN) Take 1 every 4-6 hours as needed for pain  #25 x 0   Entered by:   Lowry Ram NCMA   Authorized by:   Sammuel Cooper PA-c   Signed by:   Lowry Ram NCMA on 09/14/2009   Method used:   Print then Give to Patient   RxID:   [id number] ZOFRAN 8 MG TABS (ONDANSETRON HCL) Take 1 every 6 hours as needed for nausea  #12 x 0   Entered by:   Lowry Ram NCMA  Authorized by:   Sammuel Cooper PA-c   Signed by:   Lowry Ram NCMA on 09/14/2009   Method used:   Print then Give to Patient   RxID:   [id number]

## 2010-07-08 NOTE — Progress Notes (Signed)
Summary: Triage  Phone Note Call from Patient Call back at [phone]/ [phone]   Caller: Patient Call For: Dr. Leone Payor Reason for Call: Talk to Nurse Summary of Call: pt. was seen in ER Fri. and is impacted, nausea and vomiting. Taken Zofran, fiber, 3 bottles on magnesium and sappositories....nothing is working Initial call taken by: Karna Christmas,  September 14, 2009 9:43 AM  Follow-up for Phone Call        Patient  was seen in the ER on 4/6, 4/8 for fecal impaction.  She has tried multiple suppositories and laxatives recommended by er with very minimal results.  Patient  c/o  nausea and abdominal swelling.  Patient  will come in this afternoon and see Mike Gip PA at 3:30 Follow-up by: Darcey Nora RN, CGRN,  September 14, 2009 10:12 AM

## 2010-07-08 NOTE — Progress Notes (Signed)
Summary: Triage  Phone Note Call from Patient Call back at Home Phone [phone]   Caller: Patient Call For: Dr. Leone Payor Reason for Call: Talk to Nurse Summary of Call: Pt belly is swollen and in alot of pain. Initial call taken by: Karna Christmas,  November 05, 2009 11:19 AM  Follow-up for Phone Call        she can schedule an office visit cannot provide any advice or care over the phone other option is emergency care if needed Follow-up by: Iva Boop MD, Clementeen Graham,  November 05, 2009 1:49 PM  Additional Follow-up for Phone Call Additional follow up Details #1::        Left message for patient to call back Darcey Nora RN, [facility]  November 05, 2009 3:01 PM    Additional Follow-up for Phone Call Additional follow up Details #2::    patient is scheduled for REV with Mike Gip PA for 11/06/09 9:00 Follow-up by: Darcey Nora RN, CGRN,  November 05, 2009 3:46 PM

## 2010-07-08 NOTE — Progress Notes (Signed)
Summary: Medication refill  Phone Note Call from Patient Call back at Home Phone [phone]   Caller: Patient Call For: Dr. Leone Payor Reason for Call: Talk to Nurse Summary of Call: Need refill of Zofran and Vicodin--also wants to know if the nausea is going to be long term Initial call taken by: Karna Christmas,  September 23, 2009 12:07 PM  Follow-up for Phone Call        Pt states that the nausea gets better when she is on pain medication, but is still present. Pt states her bowels move more freely when she is taking the vicodin.  Pt states she did miralax purge and things cleaned out.  Pt states she knows vicodin should constipate her, but her body is backwards.  Pt would like refill of Zofran and Vicodin.  Please advise. Follow-up by: Francee Piccolo CMA Duncan Dull),  September 23, 2009 4:52 PM  Additional Follow-up for Phone Call Additional follow up Details #1::        cannot answer her ? will discuss at REV I am not refilling Vicodin at this time  ok to refill Zofran as written before Additional Follow-up by: Iva Boop MD, Clementeen Graham,  September 23, 2009 4:57 PM    Additional Follow-up for Phone Call Additional follow up Details #2::    I have left a message for the patient she can go pick up zofran, no refill on vicodin.  She is also asked to keep rev with Dr Leone Payor for 5/23 Follow-up by: Darcey Nora RN, CGRN,  September 24, 2009 8:29 AM  Prescriptions: ZOFRAN 8 MG TABS (ONDANSETRON HCL) Take 1 every 6 hours as needed for nausea  #12 x 1   Entered by:   Darcey Nora RN, CGRN   Authorized by:   Iva Boop MD, [facility]   Signed by:   Darcey Nora RN, CGRN on 09/24/2009   Method used:   Electronically to        [facility] [phone]* (retail)       [address] [place], [place]  [address]       Ph: [phone]       Fax: [phone]   RxID:   [id number]

## 2010-07-08 NOTE — Progress Notes (Signed)
Summary: triage  Phone Note Call from Patient Call back at Home Phone [phone]   Caller: Patient Call For: Dr. Leone Payor Reason for Call: Talk to Nurse Summary of Call: reporting "extremely swollen belly", abd pain around bellybutton, weak, weight loss, same appetite and eating same amounts, heart racing, extremely fatigued, very frequent BM's, BM's are more formed than usual for pt pt is currently uninsured... Medicaid pending Initial call taken by: Vallarie Mare,  Oct 29, 2009 10:05 AM  Follow-up for Phone Call        Patient  reports she is very tired and weak.  She reports her abdomen in "cone shaped".  She reports 6-8 stools a day, she is not constipated.  She c/o severe abdominal pain.  I have offered her an appointment today with Mike Gip PA , but she has no money and unable to afford the office fee.  Patient  reports she will go to the ER for eval.  Patient  doesn't have a primary care MD.   Follow-up by: Darcey Nora RN, CGRN,  Oct 29, 2009 10:32 AM

## 2010-07-09 ENCOUNTER — Telehealth (INDEPENDENT_AMBULATORY_CARE_PROVIDER_SITE_OTHER): Payer: Self-pay | Admitting: *Deleted

## 2010-07-14 NOTE — Assessment & Plan Note (Signed)
Summary: consult for dyspnea   Copy to:  Lucky Cowboy Primary Provider/Referring Provider:  Lucky Cowboy  CC:  Pulmonary Consult.  History of Present Illness: The pt is a 39y/o female who I have been asked to see for breathing issues.  The pt states that she had childhood asthma, but had totally resolved by teenage years.  She had no significant breathing issues until about 2mos ago, when she developed a flu like illness.  She developed sob during this time, and has never been the same since.  She currently states that she would get winded with walking one block, where before this she could walk miles.  She will get winded bringing groceries in from the car or even vacuuming one room of her house.  She describes a pressure in her chest and low back, but has not had wheezing.  She thinks prednisone helps with this.  She also describes episodes where her "throat closes off".  She denies GERD symptoms, but does have a h/o sinus disease.  She has had allergy testing in the past, but is unable to tell me the results.  She had a ct chest at [facility] yesterday, and was totally clear with no PE.  The pt has not had pfts.  Of note, the pt has had an autoimmune panel that showed a negative DS DNA, but the ANA was positive with no titer listed in the results.    Current Medications (verified): 1)  Multivitamins   Tabs (Multiple Vitamin) .... Take 2 Daily 2)  Calcium/magnesium/zinc  Tabs (Multiple Minerals) .... Once Daily 3)  Xanax 1 Mg Tabs (Alprazolam) .... Four Times A Day As Needed For Anxiety 4)  Zofran 8 Mg Tabs (Ondansetron Hcl) .... Take 1 Every 6 Hours As Needed For Nausea 5)  Vicodin 5-500 Mg Tabs (Hydrocodone-Acetaminophen) .... Take 1 Every 4-6 Hours As Needed For Pain 6)  Daytrana 30 Mg/9hr Ptch (Methylphenidate) .... Every Day 7)  Temazepam 30 Mg Caps (Temazepam) .... Take 1 Tab By Mouth At Bedtime 8)  Ventolin Hfa 108 (90 Base) Mcg/act  Aers (Albuterol Sulfate) .Marland Kitchen.. 1-2 Puffs Every 4-6 Hours  As Needed 9)  Nebulizer Meds .... As Needed  Allergies: 1)  ! Morphine 2)  ! 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      Past Surgical History: Hysteroscopy with Endometrial Ablation 2007 Gastric Bypass, Roux-en-Y, 2005 Back surgery x 2 Cesarean section x 2 Hernia repair 2005 and 2011 Re-exploration 2008  by Dr. Lily Peer, negative evaluations with EGD, puch full of vegetable matter 2008 cholecystectomy 2011 colon surgery 2011 fibroid cyst removed from uterus endometrial ablation  Family History: Reviewed history from 02/13/2008 and no changes required. Family History of Colitis/Crohn's: Family History of Colon Polyps: Family History of Diabetes:  Family History of Heart Disease: father with MI in 69's   emphysema: mother (copd) allergies: mother, 2 sisters, 1 brother asthma: 1 sister, mother heart disease: father clotting disorders: mother cancer: mother (lung)   Social History: Reviewed history from 02/13/2008 and no changes required. 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  Alcohol Use -  no Illicit Drug Use - no Daily Caffeine Use 2-3/day  Review of Systems       The patient complains of shortness of breath with activity, shortness of breath at rest, non-productive cough, chest pain, irregular  heartbeats, loss of appetite, weight change, nasal congestion/difficulty breathing through nose, and anxiety.  The patient denies productive cough, coughing up blood, acid heartburn, indigestion, abdominal pain, difficulty swallowing, sore throat, tooth/dental problems, headaches, sneezing, itching, ear ache, depression, hand/feet swelling, joint stiffness or pain, rash, change in color of mucus, and fever.    Vital Signs:  Patient profile:   40 year old female Height:      62 inches Weight:      129.25 pounds BMI:     23.73 O2 Sat:      99 % on Room air Temp:     98.1 degrees F oral Pulse rate:   77 / minute BP sitting:   126 / 80  (left arm) Cuff size:   regular  Vitals Entered By: Arman Filter LPN (June 28, 2010 11:17 AM)  O2 Flow:  Room air  Serial Vital Signs/Assessments:  Comments: 12:12 PM Ambulatory Pulse Oximetry  Resting; HR__88___    02 Sat___98%ra__  Lap1 (185 feet)   HR__83___   02 Sat__99%ra___ Lap2 (185 feet)   HR__87___   02 Sat__99%ra___    Lap3 (185 feet)   HR___91__   02 Sat__97%ra___  _X__Test Completed without Difficulty ___Test Stopped due to: Arman Filter LPN  June 28, 2010 12:13 PM    By: Arman Filter LPN   CC: Pulmonary Consult Comments Medications reviewed with patient Arman Filter LPN  June 28, 2010 11:17 AM    Physical Exam  General:  thin female in nad Eyes:  PERRLA and EOMI.   Nose:  patent without discharge no lesions or crusting, no purulence Mouth:  no exudates or other abnormality. Neck:  no jvd, tmg, LN Lungs:  totally clear to auscultation Heart:  rrr, 2/6 sem no rubs or gallops Abdomen:  soft and nontender, bs+ Extremities:  no edema or cyanosis  boot on left foot. pulses intact on right, could not examine on left due to boot. Neurologic:  alert and oriented, moves all 4.   Impression & Recommendations:  Problem # 1:  DYSPNEA ON EXERTION (ICD-786.09) the pt has significant doe that has developed  since a flu-like illness last year.  Her history and exam today is not consistent with any obvious pulmonary process, but will need further evaluation.  She has a history of childhood asthma and feels prednisone helps, but her spirometry today in the face of active symptoms is totally normal.  Her ct chest shows totally clear lung fields and no PE.  This may be all related to her anxiety disorder, but that will be a diagnosis of exclusion.  I think she needs a methacholine challenge test to put the issue of asthma to rest.  If this normal, I really see no pulmonary explanation for her symptoms, and may need cardiac evaluation per primary md?  Medications Added to Medication List This Visit: 1)  Xanax 1 Mg Tabs (Alprazolam) .... Four times a day as needed for anxiety 2)  Daytrana 30 Mg/9hr Ptch (Methylphenidate) .... Every day 3)  Temazepam 30 Mg Caps (Temazepam) .... Take 1 tab by mouth at bedtime 4)  Ventolin Hfa 108 (90 Base) Mcg/act Aers (Albuterol sulfate) .Marland Kitchen.. 1-2 puffs every 4-6 hours as needed 5)  Nebulizer Meds  .... As needed  Other Orders: Consultation Level V (19147) Spirometry w/Graph (94010) Pulse Oximetry, Ambulatory (82956) Pulmonary Referral (Pulmonary)  Patient Instructions: 1)  will schedule for methacholine challenge test to see if this is an asthma issue.  Will call you with results when available.   2)  stay off prednisone leading up to testing.  Will make results inaccurate.

## 2010-07-22 NOTE — Progress Notes (Addendum)
Summary: need to reschedule MCT  Phone Note Outgoing Call   Call placed by: Arman Filter LPN,  July 09, 2010 10:29 AM Call placed to: Patient Summary of Call: Pt no showed for her Methacholine Challenge Test.  Need to see if she is willing to reschedule.  ATC pt at home #.  No Answer and VM does not accept messages.  WCB.  Aundra Millet Meloney Feld LPN  July 09, 2010 10:30 AM   ATC pt at home #.  No answer and VM is full and unbale to accept new messages.  WCB. Aundra Millet Bence Trapp LPN  July 14, 2010 10:26 AM   ATC pt at home #.  No answer and VM is full and unable to accept new messages.  This is my 3rd attempt to contact pt.  Per protocol, will sign off on this message and send pt a letter to call the office.  Aundra Millet Spyros Winch LPN  July 15, 2010 4:19 PM

## 2010-08-16 LAB — CBC
HCT: 33.9 % — ABNORMAL LOW (ref 36.0–46.0)
Hemoglobin: 11.2 g/dL — ABNORMAL LOW (ref 12.0–15.0)
MCH: 27.9 pg (ref 26.0–34.0)
MCV: 84.5 fL (ref 78.0–100.0)
RBC: 4.01 MIL/uL (ref 3.87–5.11)

## 2010-08-16 LAB — DIFFERENTIAL
Eosinophils Absolute: 0.1 10*3/uL (ref 0.0–0.7)
Eosinophils Relative: 1 % (ref 0–5)
Lymphs Abs: 1.9 10*3/uL (ref 0.7–4.0)
Monocytes Absolute: 0.5 10*3/uL (ref 0.1–1.0)
Monocytes Relative: 9 % (ref 3–12)

## 2010-08-16 LAB — BASIC METABOLIC PANEL
CO2: 25 mEq/L (ref 19–32)
Chloride: 110 mEq/L (ref 96–112)
Creatinine, Ser: 0.6 mg/dL (ref 0.4–1.2)
GFR calc Af Amer: >60 mL/min (ref >60)
Potassium: 4 mEq/L (ref 3.5–5.1)
Sodium: 143 mEq/L (ref 135–145)

## 2010-08-17 LAB — DIFFERENTIAL
Basophils Absolute: 0.1 10*3/uL (ref 0.0–0.1)
Basophils Relative: 1 % (ref 0–1)
Eosinophils Absolute: 0.4 10*3/uL (ref 0.0–0.7)
Eosinophils Relative: 9 % — ABNORMAL HIGH (ref 0–5)
Lymphocytes Relative: 25 % (ref 12–46)
Lymphs Abs: 1.1 10*3/uL (ref 0.7–4.0)
Monocytes Absolute: 0.5 10*3/uL (ref 0.1–1.0)
Monocytes Relative: 11 % (ref 3–12)
Neutro Abs: 2.4 10*3/uL (ref 1.7–7.7)
Neutrophils Relative %: 54 % (ref 43–77)

## 2010-08-17 LAB — COMPREHENSIVE METABOLIC PANEL
ALT: 61 U/L — ABNORMAL HIGH (ref 0–35)
AST: 39 U/L — ABNORMAL HIGH (ref 0–37)
Albumin: 3.3 g/dL — ABNORMAL LOW (ref 3.5–5.2)
Alkaline Phosphatase: 96 U/L (ref 39–117)
BUN: 14 mg/dL (ref 6–23)
CO2: 25 mEq/L (ref 19–32)
Calcium: 8.6 mg/dL (ref 8.4–10.5)
Chloride: 110 mEq/L (ref 96–112)
Creatinine, Ser: 0.6 mg/dL (ref 0.4–1.2)
GFR calc Af Amer: >60 mL/min (ref >60)
GFR calc non Af Amer: >60 mL/min (ref >60)
Glucose, Bld: 56 mg/dL — ABNORMAL LOW (ref 70–99)
Potassium: 3.9 mEq/L (ref 3.5–5.1)
Sodium: 145 mEq/L (ref 135–145)
Total Bilirubin: 0.3 mg/dL (ref 0.3–1.2)
Total Protein: 5.8 g/dL — ABNORMAL LOW (ref 6.0–8.3)

## 2010-08-17 LAB — CBC
HCT: 34.9 % — ABNORMAL LOW (ref 36.0–46.0)
Hemoglobin: 11.9 g/dL — ABNORMAL LOW (ref 12.0–15.0)
MCH: 29.9 pg (ref 26.0–34.0)
MCHC: 34.3 g/dL (ref 30.0–36.0)
MCV: 87.2 fL (ref 78.0–100.0)
Platelets: 342 10*3/uL (ref 150–400)
RBC: 4 MIL/uL (ref 3.87–5.11)
RDW: 14.4 % (ref 11.5–15.5)
WBC: 4.5 10*3/uL (ref 4.0–10.5)

## 2010-08-17 LAB — URINALYSIS, ROUTINE W REFLEX MICROSCOPIC
Bilirubin Urine: NEGATIVE
Glucose, UA: NEGATIVE mg/dL
Hgb urine dipstick: NEGATIVE
Ketones, ur: NEGATIVE mg/dL
Nitrite: NEGATIVE
Protein, ur: NEGATIVE mg/dL
Specific Gravity, Urine: 1.024 (ref 1.005–1.030)
Urobilinogen, UA: 0.2 mg/dL (ref 0.0–1.0)
pH: 5.5 (ref 5.0–8.0)

## 2010-08-17 LAB — LIPASE, BLOOD: Lipase: 178 U/L (ref 23–300)

## 2010-08-17 LAB — PREGNANCY, URINE: Preg Test, Ur: NEGATIVE

## 2010-08-19 LAB — CBC
HCT: 35.8 % — ABNORMAL LOW (ref 36.0–46.0)
MCH: 29.7 pg (ref 26.0–34.0)
MCHC: 34.1 g/dL (ref 30.0–36.0)
RDW: 13.7 % (ref 11.5–15.5)

## 2010-08-19 LAB — COMPREHENSIVE METABOLIC PANEL
ALT: 36 U/L — ABNORMAL HIGH (ref 0–35)
Alkaline Phosphatase: 64 U/L (ref 39–117)
Calcium: 9.5 mg/dL (ref 8.4–10.5)
Creatinine, Ser: 0.67 mg/dL (ref 0.4–1.2)
GFR calc non Af Amer: >60 mL/min (ref >60)
Glucose, Bld: 64 mg/dL — ABNORMAL LOW (ref 70–99)
Sodium: 140 mEq/L (ref 135–145)
Total Bilirubin: 0.4 mg/dL (ref 0.3–1.2)
Total Protein: 6.5 g/dL (ref 6.0–8.3)

## 2010-08-19 LAB — DIFFERENTIAL
Basophils Absolute: 0 10*3/uL (ref 0.0–0.1)
Basophils Relative: 1 % (ref 0–1)
Eosinophils Relative: 1 % (ref 0–5)
Monocytes Absolute: 0.4 10*3/uL (ref 0.1–1.0)

## 2010-08-23 LAB — CBC
HCT: 30.9 % — ABNORMAL LOW (ref 36.0–46.0)
HCT: 33.1 % — ABNORMAL LOW (ref 36.0–46.0)
HCT: 34.2 % — ABNORMAL LOW (ref 36.0–46.0)
HCT: 36 % (ref 36.0–46.0)
HCT: 41.1 % (ref 36.0–46.0)
HCT: 41.3 % (ref 36.0–46.0)
Hemoglobin: 10.6 g/dL — ABNORMAL LOW (ref 12.0–15.0)
Hemoglobin: 11.9 g/dL — ABNORMAL LOW (ref 12.0–15.0)
Hemoglobin: 12.3 g/dL (ref 12.0–15.0)
Hemoglobin: 13.4 g/dL (ref 12.0–15.0)
Hemoglobin: 13.8 g/dL (ref 12.0–15.0)
MCHC: 32.6 g/dL (ref 30.0–36.0)
MCHC: 33.8 g/dL (ref 30.0–36.0)
MCHC: 33.9 g/dL (ref 30.0–36.0)
MCHC: 34 g/dL (ref 30.0–36.0)
MCHC: 34.3 g/dL (ref 30.0–36.0)
MCV: 89.6 fL (ref 78.0–100.0)
MCV: 89.9 fL (ref 78.0–100.0)
MCV: 90.8 fL (ref 78.0–100.0)
MCV: 90.9 fL (ref 78.0–100.0)
MCV: 90.9 fL (ref 78.0–100.0)
Platelets: 203 10*3/uL (ref 150–400)
Platelets: 220 10*3/uL (ref 150–400)
Platelets: 225 10*3/uL (ref 150–400)
Platelets: 232 10*3/uL (ref 150–400)
Platelets: 262 K/uL (ref 150–400)
RBC: 3.42 MIL/uL — ABNORMAL LOW (ref 3.87–5.11)
RBC: 3.87 MIL/uL (ref 3.87–5.11)
RBC: 4.02 MIL/uL (ref 3.87–5.11)
RBC: 4.52 MIL/uL (ref 3.87–5.11)
RBC: 4.58 MIL/uL (ref 3.87–5.11)
RDW: 13.6 % (ref 11.5–15.5)
RDW: 13.6 % (ref 11.5–15.5)
RDW: 13.6 % (ref 11.5–15.5)
RDW: 13.8 % (ref 11.5–15.5)
RDW: 13.8 % (ref 11.5–15.5)
RDW: 14 % (ref 11.5–15.5)
RDW: 14.2 % (ref 11.5–15.5)
WBC: 2.9 10*3/uL — ABNORMAL LOW (ref 4.0–10.5)
WBC: 3.7 10*3/uL — ABNORMAL LOW (ref 4.0–10.5)
WBC: 4 10*3/uL (ref 4.0–10.5)
WBC: 4 10*3/uL (ref 4.0–10.5)
WBC: 5.7 10*3/uL (ref 4.0–10.5)

## 2010-08-23 LAB — COMPREHENSIVE METABOLIC PANEL
ALT: 51 U/L — ABNORMAL HIGH (ref 0–35)
ALT: 32 U/L (ref 0–35)
AST: 33 U/L (ref 0–37)
AST: 77 U/L — ABNORMAL HIGH (ref 0–37)
AST: 93 U/L — ABNORMAL HIGH (ref 0–37)
Albumin: 2.8 g/dL — ABNORMAL LOW (ref 3.5–5.2)
Albumin: 3 g/dL — ABNORMAL LOW (ref 3.5–5.2)
Albumin: 3.6 g/dL (ref 3.5–5.2)
Alkaline Phosphatase: 49 U/L (ref 39–117)
Alkaline Phosphatase: 50 U/L (ref 39–117)
Alkaline Phosphatase: 52 U/L (ref 39–117)
Alkaline Phosphatase: 58 U/L (ref 39–117)
BUN: 10 mg/dL (ref 6–23)
BUN: 7 mg/dL (ref 6–23)
BUN: 11 mg/dL (ref 6–23)
BUN: 8 mg/dL (ref 6–23)
CO2: 27 mEq/L (ref 19–32)
CO2: 24 mEq/L (ref 19–32)
CO2: 28 mEq/L (ref 19–32)
Calcium: 9.3 mg/dL (ref 8.4–10.5)
Chloride: 102 mEq/L (ref 96–112)
Chloride: 102 mEq/L (ref 96–112)
Chloride: 108 mEq/L (ref 96–112)
Chloride: 110 mEq/L (ref 96–112)
Creatinine, Ser: 0.66 mg/dL (ref 0.4–1.2)
Creatinine, Ser: 0.54 mg/dL (ref 0.4–1.2)
GFR calc Af Amer: >60 mL/min (ref >60)
GFR calc Af Amer: >60 mL/min (ref >60)
GFR calc non Af Amer: >60 mL/min (ref >60)
GFR calc non Af Amer: >60 mL/min (ref >60)
GFR calc non Af Amer: >60 mL/min (ref >60)
Glucose, Bld: 105 mg/dL — ABNORMAL HIGH (ref 70–99)
Glucose, Bld: 52 mg/dL — ABNORMAL LOW (ref 70–99)
Potassium: 3.2 mEq/L — ABNORMAL LOW (ref 3.5–5.1)
Potassium: 4.1 mEq/L (ref 3.5–5.1)
Potassium: 4.2 mEq/L (ref 3.5–5.1)
Potassium: 4.5 mEq/L (ref 3.5–5.1)
Sodium: 138 mEq/L (ref 135–145)
Sodium: 139 mEq/L (ref 135–145)
Sodium: 141 mEq/L (ref 135–145)
Total Bilirubin: 0.1 mg/dL — ABNORMAL LOW (ref 0.3–1.2)
Total Bilirubin: 0.3 mg/dL (ref 0.3–1.2)
Total Bilirubin: 0.3 mg/dL (ref 0.3–1.2)
Total Bilirubin: 0.4 mg/dL (ref 0.3–1.2)
Total Protein: 5.6 g/dL — ABNORMAL LOW (ref 6.0–8.3)
Total Protein: 6 g/dL (ref 6.0–8.3)

## 2010-08-23 LAB — CARDIAC PANEL(CRET KIN+CKTOT+MB+TROPI)
CK, MB: 17.4 ng/mL (ref 0.3–4.0)
CK, MB: 19.9 ng/mL (ref 0.3–4.0)
CK, MB: 24.4 ng/mL (ref 0.3–4.0)
Relative Index: 1.9 (ref 0.0–2.5)
Relative Index: 2.1 (ref 0.0–2.5)
Total CK: 896 U/L — ABNORMAL HIGH (ref 7–177)
Troponin I: 0.01 ng/mL (ref 0.00–0.06)

## 2010-08-23 LAB — URINALYSIS, ROUTINE W REFLEX MICROSCOPIC
Bilirubin Urine: NEGATIVE
Bilirubin Urine: NEGATIVE
Glucose, UA: NEGATIVE mg/dL
Glucose, UA: NEGATIVE mg/dL
Hgb urine dipstick: NEGATIVE
Hgb urine dipstick: NEGATIVE
Hgb urine dipstick: NEGATIVE
Ketones, ur: 15 mg/dL — AB
Ketones, ur: NEGATIVE mg/dL
Nitrite: NEGATIVE
Protein, ur: NEGATIVE mg/dL
Specific Gravity, Urine: 1.027 (ref 1.005–1.030)
Specific Gravity, Urine: 1.023 (ref 1.005–1.030)
Urobilinogen, UA: 0.2 mg/dL (ref 0.0–1.0)
pH: 6 (ref 5.0–8.0)
pH: 5.5 (ref 5.0–8.0)
pH: 6.5 (ref 5.0–8.0)

## 2010-08-23 LAB — COMPREHENSIVE METABOLIC PANEL WITH GFR
ALT: 60 U/L — ABNORMAL HIGH (ref 0–35)
Albumin: 3.7 g/dL (ref 3.5–5.2)
Calcium: 9.3 mg/dL (ref 8.4–10.5)
GFR calc Af Amer: >60 mL/min (ref >60)
Glucose, Bld: 97 mg/dL (ref 70–99)
Sodium: 141 meq/L (ref 135–145)
Total Protein: 6.4 g/dL (ref 6.0–8.3)

## 2010-08-23 LAB — DIFFERENTIAL
Basophils Absolute: 0 10*3/uL (ref 0.0–0.1)
Basophils Absolute: 0 10*3/uL (ref 0.0–0.1)
Basophils Absolute: 0 10*3/uL (ref 0.0–0.1)
Basophils Absolute: 0 10*3/uL (ref 0.0–0.1)
Basophils Relative: 1 % (ref 0–1)
Basophils Relative: 1 % (ref 0–1)
Basophils Relative: 1 % (ref 0–1)
Basophils Relative: 1 % (ref 0–1)
Basophils Relative: 1 % (ref 0–1)
Eosinophils Absolute: 0.1 10*3/uL (ref 0.0–0.7)
Eosinophils Absolute: 0.1 K/uL (ref 0.0–0.7)
Eosinophils Absolute: 0.2 10*3/uL (ref 0.0–0.7)
Eosinophils Absolute: 0.1 10*3/uL (ref 0.0–0.7)
Eosinophils Relative: 3 % (ref 0–5)
Eosinophils Relative: 4 % (ref 0–5)
Lymphocytes Relative: 32 % (ref 12–46)
Lymphs Abs: 1.3 K/uL (ref 0.7–4.0)
Lymphs Abs: 1.5 10*3/uL (ref 0.7–4.0)
Monocytes Absolute: 0.3 10*3/uL (ref 0.1–1.0)
Monocytes Absolute: 0.4 10*3/uL (ref 0.1–1.0)
Monocytes Absolute: 0.4 K/uL (ref 0.1–1.0)
Monocytes Absolute: 0.3 10*3/uL (ref 0.1–1.0)
Monocytes Relative: 10 % (ref 3–12)
Monocytes Relative: 7 % (ref 3–12)
Neutro Abs: 2.1 10*3/uL (ref 1.7–7.7)
Neutro Abs: 3.7 10*3/uL (ref 1.7–7.7)
Neutro Abs: 3.8 10*3/uL (ref 1.7–7.7)
Neutro Abs: 4 10*3/uL (ref 1.7–7.7)
Neutrophils Relative %: 54 % (ref 43–77)
Neutrophils Relative %: 65 % (ref 43–77)
Neutrophils Relative %: 65 % (ref 43–77)
Neutrophils Relative %: 69 % (ref 43–77)

## 2010-08-23 LAB — PROTIME-INR: Prothrombin Time: 13.6 seconds (ref 11.6–15.2)

## 2010-08-23 LAB — LIPASE, BLOOD
Lipase: 27 U/L (ref 11–59)
Lipase: 54 U/L (ref 11–59)

## 2010-08-23 LAB — BENZODIAZEPINE, QUANTITATIVE, URINE
Lorazepam UR QT: 452 NG/ML — ABNORMAL HIGH
Nordiazepam GC/MS Conf: NEGATIVE NG/ML
Oxazepam GC/MS Conf: NEGATIVE NG/ML
Temazepam GC/MS Conf: NEGATIVE NG/ML

## 2010-08-23 LAB — BASIC METABOLIC PANEL
BUN: 5 mg/dL — ABNORMAL LOW (ref 6–23)
CO2: 27 mEq/L (ref 19–32)
CO2: 27 mEq/L (ref 19–32)
CO2: 30 mEq/L (ref 19–32)
Calcium: 8.1 mg/dL — ABNORMAL LOW (ref 8.4–10.5)
Calcium: 8.3 mg/dL — ABNORMAL LOW (ref 8.4–10.5)
Calcium: 8.4 mg/dL (ref 8.4–10.5)
Chloride: 109 mEq/L (ref 96–112)
Creatinine, Ser: 0.55 mg/dL (ref 0.4–1.2)
Creatinine, Ser: 0.58 mg/dL (ref 0.4–1.2)
GFR calc Af Amer: >60 mL/min (ref >60)
GFR calc Af Amer: >60 mL/min (ref >60)
GFR calc non Af Amer: >60 mL/min (ref >60)
Glucose, Bld: 74 mg/dL (ref 70–99)
Glucose, Bld: 91 mg/dL (ref 70–99)
Glucose, Bld: 93 mg/dL (ref 70–99)
Potassium: 3.7 mEq/L (ref 3.5–5.1)
Potassium: 3.8 mEq/L (ref 3.5–5.1)
Sodium: 137 mEq/L (ref 135–145)
Sodium: 139 mEq/L (ref 135–145)

## 2010-08-23 LAB — DRUGS OF ABUSE SCREEN W/O ALC, ROUTINE URINE
Amphetamine Screen, Ur: POSITIVE — AB
Creatinine,U: 114.5 mg/dL
Marijuana Metabolite: NEGATIVE
Opiate Screen, Urine: POSITIVE — AB

## 2010-08-23 LAB — POCT CARDIAC MARKERS
Myoglobin, poc: 52.6 ng/mL (ref 12–200)
Troponin i, poc: <0.05 ng/mL (ref 0.00–0.09)

## 2010-08-23 LAB — OPIATE, QUANTITATIVE, URINE
Codeine Urine: NEGATIVE NG/ML
Hydromorphone GC/MS Conf: NEGATIVE NG/ML
Oxymorphone: NEGATIVE NG/ML

## 2010-08-23 LAB — POCT I-STAT, CHEM 8
BUN: 10 mg/dL (ref 6–23)
Chloride: 110 mEq/L (ref 96–112)
Creatinine, Ser: 0.5 mg/dL (ref 0.4–1.2)
Potassium: 3.7 mEq/L (ref 3.5–5.1)
Sodium: 141 mEq/L (ref 135–145)
TCO2: 24 mmol/L (ref 0–100)

## 2010-08-23 LAB — HEPATIC FUNCTION PANEL
AST: 29 U/L (ref 0–37)
Albumin: 3.3 g/dL — ABNORMAL LOW (ref 3.5–5.2)
Total Bilirubin: 0.3 mg/dL (ref 0.3–1.2)

## 2010-08-23 LAB — ANTI-NUCLEAR AB-TITER (ANA TITER): ANA Titer 1: 1:640 {titer} — ABNORMAL HIGH

## 2010-08-23 LAB — VITAMIN B12: Vitamin B-12: 398 pg/mL (ref 211–911)

## 2010-08-23 LAB — EXTRACTABLE NUCLEAR ANTIGEN ANTIBODY
SSA (Ro) (ENA) Antibody, IgG: 24 AU/mL (ref <30)
Sm/rnp: <1 AU/mL (ref <30)

## 2010-08-23 LAB — GLUCOSE, CAPILLARY: Glucose-Capillary: 233 mg/dL — ABNORMAL HIGH (ref 70–99)

## 2010-08-23 LAB — AMPHETAMINES URINE CONFIRMATION
Methamphetamine GC/MS, Ur: NEGATIVE NG/ML
Methylenedioxyamphetamine: NEGATIVE NG/ML

## 2010-08-23 LAB — IRON AND TIBC: TIBC: 293 ug/dL (ref 250–470)

## 2010-08-23 LAB — FOLATE: Folate: >20 ng/mL

## 2010-08-23 LAB — VITAMIN D 25 HYDROXY (VIT D DEFICIENCY, FRACTURES): Vit D, 25-Hydroxy: 34 ng/mL (ref 30–89)

## 2010-08-23 LAB — ALDOLASE: Aldolase: 9 U/L — ABNORMAL HIGH (ref <=8.1)

## 2010-08-23 LAB — FOLATE RBC: RBC Folate: 588 ng/mL (ref 180–600)

## 2010-08-23 LAB — RETICULOCYTES: Retic Count, Absolute: 28 10*3/uL (ref 19.0–186.0)

## 2010-08-23 LAB — AMYLASE: Amylase: 52 U/L (ref 0–105)

## 2010-08-23 LAB — FERRITIN: Ferritin: 11 ng/mL (ref 10–291)

## 2010-08-23 LAB — LACTATE DEHYDROGENASE: LDH: 216 U/L (ref 94–250)

## 2010-08-23 LAB — SEDIMENTATION RATE: Sed Rate: 20 mm/hr (ref 0–22)

## 2010-08-25 LAB — POCT I-STAT, CHEM 8
BUN: 7 mg/dL (ref 6–23)
Chloride: 103 mEq/L (ref 96–112)
HCT: 34 % — ABNORMAL LOW (ref 36.0–46.0)
Potassium: 4.6 mEq/L (ref 3.5–5.1)
Sodium: 137 mEq/L (ref 135–145)

## 2010-08-25 LAB — COMPREHENSIVE METABOLIC PANEL
ALT: 24 U/L (ref 0–35)
AST: 22 U/L (ref 0–37)
CO2: 30 mEq/L (ref 19–32)
Calcium: 8.4 mg/dL (ref 8.4–10.5)
GFR calc Af Amer: >60 mL/min (ref >60)
Sodium: 140 mEq/L (ref 135–145)
Total Protein: 5.2 g/dL — ABNORMAL LOW (ref 6.0–8.3)

## 2010-08-25 LAB — CBC
MCHC: 33.6 g/dL (ref 30.0–36.0)
RBC: 3.89 MIL/uL (ref 3.87–5.11)

## 2010-08-25 LAB — DIFFERENTIAL
Eosinophils Absolute: 0.1 10*3/uL (ref 0.0–0.7)
Eosinophils Relative: 2 % (ref 0–5)
Lymphs Abs: 1.7 10*3/uL (ref 0.7–4.0)
Monocytes Absolute: 0.5 10*3/uL (ref 0.1–1.0)
Monocytes Relative: 7 % (ref 3–12)

## 2010-08-25 LAB — LIPASE, BLOOD: Lipase: 28 U/L (ref 11–59)

## 2010-09-11 LAB — URINALYSIS, ROUTINE W REFLEX MICROSCOPIC
Glucose, UA: NEGATIVE mg/dL
Hgb urine dipstick: NEGATIVE
Leukocytes, UA: NEGATIVE
Protein, ur: NEGATIVE mg/dL
Specific Gravity, Urine: 1.024 (ref 1.005–1.030)
Urobilinogen, UA: 0.2 mg/dL (ref 0.0–1.0)

## 2010-09-11 LAB — COMPREHENSIVE METABOLIC PANEL
ALT: 31 U/L (ref 0–35)
AST: 37 U/L (ref 0–37)
Albumin: 2.8 g/dL — ABNORMAL LOW (ref 3.5–5.2)
Alkaline Phosphatase: 40 U/L (ref 39–117)
Calcium: 7.9 mg/dL — ABNORMAL LOW (ref 8.4–10.5)
GFR calc Af Amer: >60 mL/min (ref >60)
Glucose, Bld: 128 mg/dL — ABNORMAL HIGH (ref 70–99)
Potassium: 3.2 mEq/L — ABNORMAL LOW (ref 3.5–5.1)
Sodium: 138 mEq/L (ref 135–145)
Total Protein: 4.5 g/dL — ABNORMAL LOW (ref 6.0–8.3)

## 2010-09-11 LAB — POCT URINALYSIS DIP (DEVICE)
Bilirubin Urine: NEGATIVE
Glucose, UA: 100 mg/dL — AB
Hgb urine dipstick: NEGATIVE
Nitrite: POSITIVE — AB
Specific Gravity, Urine: >=1.03 (ref 1.005–1.030)

## 2010-09-11 LAB — URINE MICROSCOPIC-ADD ON

## 2010-09-11 LAB — DIFFERENTIAL
Basophils Relative: 1 % (ref 0–1)
Eosinophils Absolute: 0.1 10*3/uL (ref 0.0–0.7)
Eosinophils Relative: 2 % (ref 0–5)
Lymphs Abs: 2.2 10*3/uL (ref 0.7–4.0)
Monocytes Absolute: 0.4 10*3/uL (ref 0.1–1.0)
Monocytes Relative: 7 % (ref 3–12)

## 2010-09-11 LAB — CBC
Hemoglobin: 12.3 g/dL (ref 12.0–15.0)
MCHC: 33.5 g/dL (ref 30.0–36.0)
RDW: 13.3 % (ref 11.5–15.5)

## 2010-09-11 LAB — POCT CARDIAC MARKERS: Troponin i, poc: <0.05 ng/mL (ref 0.00–0.09)

## 2010-10-09 ENCOUNTER — Inpatient Hospital Stay (INDEPENDENT_AMBULATORY_CARE_PROVIDER_SITE_OTHER)
Admission: RE | Admit: 2010-10-09 | Discharge: 2010-10-09 | Disposition: A | Discharge status: Home / Self Care | Payer: Medicaid Other | Source: Ambulatory Visit | Attending: Family Medicine | Admitting: Family Medicine

## 2010-10-09 DIAGNOSIS — F410 Panic disorder [episodic paroxysmal anxiety] without agoraphobia: Secondary | ICD-10-CM

## 2010-10-19 NOTE — H&P (Signed)
NAME:  Peggy Austin, Peggy Austin NO.:  [id number]   MEDICAL RECORD NO.:  [id number]          PATIENT TYPE:  INP   LOCATION:  1831                         FACILITY:  [facility]   PHYSICIAN:  Elliot Cousin, M.D.    DATE OF BIRTH:  11-26-1970   DATE OF ADMISSION:  01/11/2008  DATE OF DISCHARGE:                              HISTORY & PHYSICAL   PRIMARY CARE PHYSICIAN:  [facility].   CHIEF COMPLAINT:  Abdominal pain.   HISTORY OF PRESENT ILLNESS:  The patient is a 40 year old woman with a  past medical history significant for gastric bypass surgery  approximately 4 years ago, cervical dysplasia, chronic back pain, and  asthma.  She presents to the emergency department with a chief complaint  of abdominal pain.  The pain is located in the mid upper abdomen and the  right upper quadrant.  It started approximately 2 days ago.  This  morning, she states that her abdomen began to swell.  The pain has  been sharp and intermittent.  She rates the pain a 7/10 in intensity.  The pain radiates from the mid epigastric region to the right upper and  right lower quadrants.  The pain has been intermittently accompanied by  nausea.  She had 1 episode of vomiting this morning without any evidence  of coffee-grounds emesis or bright red blood in her emesis. Her appetite  has been poor. Her last bowel movement was 2 days ago, which is unusual  for her.  She usually has 4-5 loose bowel movements daily.  She has had  no recent history of bright red blood per rectum or black tarry stools.  She denies associated fever and chills.  She had a similar episode of  abdominal pain in May or June of 2008.  She was evaluated by her general  surgeon at that time; the same surgeon who performed the gastric bypass  surgery several years ago.  Dr. 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   During the evaluation in the  emergency department, the patient is noted  to be relatively hypotensive with a systolic blood pressure ranging from  85-115.  Her lab data are significant for a normal lipase of 35, an  elevated SGOT of 197 and an elevated SGPT of 287.  Her total bilirubin  is within normal limits at 0.6.  Her white blood cell count is within  normal limits at 7.4.  A CT scan of the abdomen and pelvis was performed  and the results are significant for moderate intrahepatic biliary ductal  dilatation which is progressive; the common duct measures 1.7 cm  compared with 1.4 cm on prior CT; no obstructive mass is identified; the  gallbladder is mildly prominent but there are no calcified stones or  evidence of acute cholecystitis; probable constipation with stool  throughout the colon; there is apparent jejunal wall thickening felt to  be due to under distention; no free intraperitoneal air or ascites.  CT  scan of the pelvis reveals no acute pelvic  process.  The patient will be  admitted for further evaluation and management.   PAST MEDICAL HISTORY:  1. Status post gastric bypass surgery approximately 4 years ago      performed by Dr. Lily Peer at [facility]  The      patient lost approximately 190 pounds.  2. Status post exploratory laparotomy in May or June of 2008 at      [facility] for gastric outlet obstruction, performed by Dr.      Lily Peer.  3. History of malabsorption syndrome.  4. Cervical dysplasia, status post hysteroscopy, endometrial ablation,      and CO2 laser ablation in 2007.  5. Chronic low back pain, status post microdiskectomy, decompressive      surgery of L5-S1 in July of 2004 by Dr. Shelle Iron.  This was a redo      operation.  6. History of hypertension, resolved after weight loss.  7. Asthma.  8. History of Bell's palsy.  9. Status post bilateral tubal ligation.  10.Gestational diabetes.  11.Depression with anxiety.  12.History of seizure only once,  thought to be secondary to      medications.   MEDICATIONS:  1. Lamictal 300 mg q.h.s.  2. Multivitamin twice daily.  3. Folic acid question dose twice daily.  4. Xanax 1 mg b.i.d. to q.i.d. p.r.n.  5. Albuterol MDI 2 puffs every 4 hours as needed.  6. Ritalin 30 mg patch applied for 9 hours and then take off daily.  7. Recent treatment with Solu-Medrol intramuscularly at primary care      physician's office for asthma exacerbation.  8. Advair Diskus 250/50 1 inhalation twice daily.  9. Zyrtec 10 mg daily.   ALLERGIES:  THE PATIENT HAS AN ALLERGY TO PENICILLIN, DIAGNOSED AS A  CHILD.  SHE CANNOT RECALL THE REACTION.   SOCIAL HISTORY:  The patient is divorced.  [place]  She has 2 children.  She is currently unemployed.  She is a  former Customer service manager.  She denies tobacco, alcohol and illicit drug  use.   FAMILY HISTORY:  Her father is 5 years of age and has a history of  coronary artery disease with 3 myocardial infarctions.  He also has  diabetes and hypertension.  Her mother died of pneumonia and  complications of COPD at 42 years of age.   REVIEW OF SYSTEMS:  As above in the history of present illness.  Otherwise negative.   EXAM:  Temperature 97, blood pressure 86/61, pulse 86, respiratory rate  14, oxygen saturation 100% on room air.  GENERAL:  The patient is a pleasant 40 year old Caucasian woman who is  currently lying in bed in no acute distress.  HEENT:  Head is normocephalic, nontraumatic.  Pupils are equal, round  and reactive to light.  Extraocular muscles are intact.  Conjunctivae  are clear.  Sclerae white.  Tympanic membranes not examined.  Oropharynx  reveals mildly dry mucous membranes.  No posterior exudates or erythema.  Nasal mucosa is mildly dry.  No sinus tenderness.  NECK:  Supple.  No adenopathy, no thyromegaly, no bruit, no JVD.  LUNGS:  Clear to auscultation bilaterally.  HEART:  S1, S2 with no murmurs, rubs or  gallops.  ABDOMEN:  A well-healed small abdominal scar.  Abdomen is mildly obese.  Positive bowel sounds.  Moderately tender in the epigastrium and right  upper quadrant with questionably positive Murphy's sign.  Mild voluntary  guarding.  The  abdomen does not appear to be rigid or distended.  No  obvious hepatosplenomegaly.  GU AND RECTAL:  Deferred.  EXTREMITIES:  Pedal pulses are palpable bilaterally.  No pretibial edema  and no pedal edema.  NEUROLOGIC:  The patient is alert and oriented x3.  Cranial nerves II-  XII are intact.  Strength is 5/5 throughout.  Sensation is intact.   ADMISSION LABORATORIES:  Lipase 35.  Sodium 138, potassium 4.7, chloride  107, CO2 28, glucose 93, BUN 11, creatinine 0.57, total bilirubin 0.6,  alkaline phosphatase 126, SGOT 197, SGPT 287, total protein 5.4, albumin  3, calcium 8.6.  WBC 7.4, hemoglobin 11.4, platelets 227.  Urine  pregnancy test negative.  Urinalysis essentially negative with exception  of 500 glucose.   ASSESSMENT:  1. Abdominal pain with radiographic evidence of moderate intrahepatic      biliary ductal dilatation, progressive compared with previous CT      scan.  2. Status post gastric bypass surgery with a history of gastric outlet      obstruction in the past.  3. Hepatic transaminitis, possibly secondary to intrahepatic biliary      process.  4. Hypotension.  The patient does not appear to be septic. She is      afebrile and her white blood count is within normal limits. The      hypotension may be secondary to hypovolemia.   PLAN:  1. The patient will be admitted for further evaluation and management.  2. Will start supportive care with pain management, antiemetics,      proton pump inhibitor therapy, and IV fluids.  3. Will start sips of clear liquids and ice chips only.  4. Will consult the gastroenterologist.  5. Will try to obtain records from [facility]  to reveiw the  patient's previous      operations.  6. Will also check a fasting cortisol and TSH.      Elliot Cousin, M.D.  Electronically Signed     DF/MEDQ  D:  01/11/2008  T:  01/11/2008  Job:  [id number]   cc:   Melvern Banker

## 2010-10-19 NOTE — Discharge Summary (Signed)
NAME:  Peggy Austin, FLUD NO.:  [id number]   MEDICAL RECORD NO.:  [id number]          PATIENT TYPE:  INP   LOCATION:  2003                         FACILITY:  [facility]   PHYSICIAN:  Eduard Clos, MDDATE OF BIRTH:  05/11/1971   DATE OF ADMISSION:  01/11/2008  DATE OF DISCHARGE:  01/18/2008                               DISCHARGE SUMMARY   COURSE IN THE HOSPITAL:  A 40 year old female with known history of  bronchial asthma, gastric bypass, cervical dysplasia, and chronic back  pain who presented to the ER complaining of abdominal pain.  The patient  also was found to be having mild hypotension probably secondary to  dehydration.  On admission, the patient was found to have AST and ALT  levels around 197 and 287.  Had a CAT scan of the abdomen and pelvis  done in the ER showing moderate intrahepatic biliary ductal dilatation,  which is progressive.  The common duct measuring 1.7 cm compared with  1.4 cm prior.  No obstructive mass identified.  The gallbladder being  mildly prominent.  No calcified stones or evidence of acute  cholecystitis.  No free intraperitoneal air or ascites.  Pelvis did not  show any acute features.  The patient was admitted to medical floor.  GI  consult was obtained.  The patient subsequently underwent sonogram of  the abdomen, which showed a stable dilatation of the gallbladder and  common bile duct without evidence of stones or acute cholecystitis.  MRCP done prior also showed biliary dilatation of unexplained etiology.  No evidence of choledocholithiasis, pancreatic mass, or pancreatic  ductal dilatation.  No ampullary lesion is identified.  No acute  abdominal findings.  Per Dr. Leone Payor, gastroenterologist, the patient  underwent EGD, which also did not show any acute findings.  Per  gastroenterologist, the patient may need ERCP and advised it to be done  with a surgeon at [facility] for which the patient has agreed.  At  this time,  the patient's pain has been largely controlled by oxycodone,  and as per gastroenterologist the patient can be discharged home on  oxycodone and to follow with her surgeon at [facility].  Presently, the patient is able to tolerate diet and pain being largely  controlled.  The patient has agreed to follow with her primary care  physician and her surgeon at [facility].   FINAL DIAGNOSES:  1. Abdominal pain with intraductal biliary dilatation.  2. History of gastric bypass.  3. Abnormal liver function tests, which is trending down.  4. History of bronchial asthma.  5. History of cervical dysplasia.   MEDICATIONS AT DISCHARGE:  1. Lamictal 300 mg p.o. daily.  2. Advair Diskus 250/50 one puff twice a day.  3. Albuterol inhaler as needed.  4. Zyrtec 10 mg p.o. daily.  5. Xanax 1 mg p.o. 4 times daily as needed.  6. patch 30 mg 9 hours daily.  7. Multivitamin 1 tablet p.o. b.i.d.  8. Folic acid 2 times daily.  9. Calcium with vitamin D 3 times a day.  10.Zinc 30 mg p.o. daily.  11.Magnesium 250 mg p.o. daily.  12.Oxycodone 5 mg p.o. q.6 hourly p.r.n. for pain.   PLAN:  The patient is advised to follow with her primary care physician  within a week's time and recheck LFTs and mag levels, to follow with her  surgeon at [facility] within a week's time, and the patient was  advised she may need further workup on her intrahepatic biliary duct  dilatation including an ERCP may be needed.  The patient understands the  plan and the need.  Diet as tolerated.      Eduard Clos, MD  Electronically Signed     ANK/MEDQ  D:  01/18/2008  T:  01/19/2008  Job:  [id number]

## 2010-10-22 NOTE — Consult Note (Signed)
[facility]  Patient:    Peggy Austin                           MRN: [id number] Proc. Date: 05/14/99 Adm. Date:  [id number] Attending:  Lorre Nick CC:         Tera Mater. Evlyn Kanner, M.D.             Thad Ranger, M.D                          Consultation Report  REASON FOR CONSULTATION:  Dr. Evlyn Kanner asked me to see this 40 year old female because of a dilated bile duct in the context of severe vomiting and some diarrhea and severe upper abdominal pain.  HISTORY:  Peggy Austin has had several episodes of GI symptomatology over the past several months, without intercurrent symptoms.  The previous episodes consisted of the abrupt onset of nausea and vomiting, as well as maybe some diarrhea.  The episode last night which began abruptly about 5 p.m. (about four hours after eating a [employer] taco) was different in that it also included severe epigastric pain and right upper quadrant pain that radiated through to the back.  She, as before, had severe nausea and vomiting, nonbloody, probably emesis in all, as well as a couple of loose stools.  Because of the abdominal pain, she presented to the emergency room where liver chemistries, white count, and amylase were normal but ultrasound showed an 8.6 m diameter to the common bile duct (gallbladder itself was normal without stones). Through the night, she did well on Demerol and Phenergan and is basically feeling well this morning.  Of note, there is a very strong family history of gallbladder disease in her grandmother, her mother and two sisters.  The patient herself is obese and is about 14 months postpartum.  PAST MEDICAL HISTORY:  ALLERGIES:  PENICILLIN (rash).  OUTPATIENT MEDICATIONS:  Prevacid 30 b.i.d. begun recently by her allergist for  possible reflux exacerbating her asthma, also PAXIL, ATIVAN, PULMICORT, PROVENTIL, SINGULAR, FLONASE, and ASTELIN.  OPERATIONS:  C-section.  CHRONIC  MEDICAL ILLNESSES:  Asthma and panic disorder.  HABITS:  Nonsmoker, nondrinker.  FAMILY HISTORY:  Posterior tibial for gallbladder disease as noted above, but negative for other GI tract illnesses.  SOCIAL HISTORY:  The patient works as a Diplomatic Services operational officer at [employer] and lives at home with her young son as described above.  REVIEW OF SYSTEMS:  Basically negative for ongoing GI tract symptomatology.  No  reflux symptoms, dysphagia, chronic nausea or abdominal pain, constipation, diarrhea, or urinary tract symptoms.  Her asthma has been much worse over the past 6-8 weeks, prompting the institution of Prevacid as described above.  The patient has never had shaking chills, dark urine, or scleral icterus with any of the episodes of abdominal symptoms.  PHYSICAL EXAMINATION:  GENERAL:  This is an overweight, pleasant, healthy-appearing, articulate white female in no evident distress, and appearing neither anxious or depressed.  VITAL SIGNS:  Pertinent for the absence of fever, normal blood pressure.  Heart  rate was slightly elevated on admission.  HEENT:  Anicteric.  No conjunctival pallor.  Mucous membranes dry but benign.  CHEST:  Diminished breath sounds probably due to the patients substantial obesity. No definite CVAT.  HEART:  Distant heart sounds.  No gallops, rubs, clicks, or arrhythmias.  ABDOMEN:  The abdomen is  very obese.  Bowel sounds are present.  No bruits.  At  this time, no abdominal tenderness is present.  The abdomen is soft and there is no guarding.  No masses or hepatomegaly.  RECTAL:  Some serosanguineous fluid admixed with the mucoid brown stool and it oes test heme positive, but there is no evidence of melena or frank blood.  LABORATORY:  White count, liver chemistries, and amylase were all within normal  limits.  Abdominal ultrasound:  See above.  Pertinent for dilated CBD.  IMPRESSION:  I tend to think that this is  an episode of recurring symptomatic choledocholithiasis without cholangitis.  In favor of this would be the dilated  bile duct and the patients strong family history of gallbladder disease as well as compatible symptomatology.  The fact her liver chemistries are normal at time of presentation would go against this diagnosis but sometimes there is a delay before they will go up, so I will plan to repeat them.  The patients heme positive stool is probably not going to be significant but should be worked up by subsequent home hemoccults.  PLAN:  1. Hepatobiliary scan at this time.  2. If the hepatobiliary scan is positive, I would favor a surgical consultation.     If the hepatobiliary scan is negative and liver chemistries remain normal,  would advance the patients diet and see how she does.  3. Ultimately, the patient should probably have either an MRI cholangiogram or an     ERCP to better define whether or not a stone is present in the common bile     duct.  4. Ultimately, the patient should have repeat hemoccults and/or flexible     sigmoidoscopy in view of the blood noted on todays rectal exam although I tend     to think that is probably just due to hemorrhoidal excoriation. DD:  05/14/99 TD:  05/14/99 Job: [id number]

## 2010-10-22 NOTE — H&P (Signed)
NAMEMARCELLINA, Peggy Austin                 ACCOUNT NO.:  [id number]   MEDICAL RECORD NO.:  [id number]          PATIENT TYPE:  AMB   LOCATION:  SDC                           [facility]ILITY[facility]  [facility]   PHYSICIAN:  Juan H. Lily Peer, M.D.DATE OF BIRTH:  [birth year]   DATE OF ADMISSION:  01/27/2006  DATE OF DISCHARGE:                                HISTORY & PHYSICAL   CHIEF COMPLAINT:  1. Menorrhagia.  2. Cervical dysplasia.  3. Vaginal dysplasia.   HISTORY:  The patient is a 40 year old, gravida 2, para 2, who has had  longstanding history of menorrhagia.  The patient in the past had tried oral  contraceptive pills without no success.  She eventually wanted to try a  Mirena IUD but continued to bleed regardless.  She had an endometrial biopsy  done on November 15, 2005 which was benign.  The patient had decided to proceed  with an endometrial ablation technique and literature information had been  provided.  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  She had a hybrid capture HPV which demonstrated that she had  the high risk HPV detected.  The patient on January 26, 2006 underwent a  colposcopic evaluation of the vagina and cervix and was noted to have a  acetowhite lesion at the ectocervix at the 12 and 6 o'clock position as well  as some local plicate areas in the posterior vaginal fornix.  These areas  were biopsied along with an ECC and marked rush and the results will be  ready prior to her planned endometrial ablation so that we can proceed with  CO2 laser ablation of the cervical and vaginal dysplasia in the same  setting.   PAST MEDICAL HISTORY:  The patient has gone through gastric bypass in 2005.  She has had umbilical herniorrhaphy as well as abdominal wall herniorrhaphy.  She has D&C in the past.  She had C-section with bilateral tubal  sterilization in the past.  She has had back  surgery in 1999 and 2004.  She  suffers from connective tissue disease currently and vitamin B12 shots  monthly and supplemental calcium vitamins and vitamin supplements.  She has  had history in the past of uterine polyps as well.   ALLERGIES:  She is allergic to PENICILLIN.   PHYSICAL EXAMINATION:  VITAL SIGNS:  The patient weighs 125 pounds.  Height  5 feet 2 inches tall.  Blood pressure 106/70.  HEENT:  Unremarkable.  NECK:  Supple.  Trachea midline.  No carotid bruits.  No thyromegaly.  LUNGS:  Clear to auscultation without any rhonchi or wheezes.  HEART:  Regular rate and rhythm without any murmurs or gallops.  BREASTS:  Not done.  ABDOMEN:  Soft and nontender without rebound or guarding.  PELVIC:  Bartholin, urethral and Skene's was within normal limits.  Vagina  and cervix showed no lesions or discharge.  Uterus anteverted.  Normal size,  shape and consistency.  Vagina and cervix as described  above during the  colposcopic evaluation.  Adnexa without mass or tenderness.  RECTAL:  Deferred.   ASSESSMENT:  A 40 year old gravida 2, para 2 with prior history of tubal  sterilization with menorrhagia.  Different treatment options such as hormone  and Mirena intrauterine device have been tried in the past without success.  Endometrial biopsy done on November 15, 2005 demonstrated degenerating secretory  type endometrium.  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  Cervical and vaginal biopsies done January 26, 2006.  Pending results would be available at the time right before her  surgery. Specimen was marked rush.  At this point, it will be determined  also if after the endometrial ablation via the NovaSure technique that we  may proceed then with CO2 laser ablation of cervical and vaginal dysplasia.  The risks, benefits, pros and cons of both procedures were discussed to   include perforation of the uterus requiring emergency laparoscopy to  laparotomy.  Also injury from the laser beam were also discussed as well.  All these questions were answered.  All questions were answered and we will  follow accordingly.   PLAN:  The patient was scheduled for a NovaSure endometrial ablation and  possible CO2 laser ablation of cervical and vaginal dysplasia on January 27, 2006 at 1 p.m. at [facility].      [place] H. Lily Peer, M.D.  Electronically Signed     JHF/MEDQ  D:  01/27/2006  T:  01/27/2006  Job:  [id number]

## 2010-10-22 NOTE — H&P (Signed)
   NAME:  Peggy Austin, Peggy Austin                           ACCOUNT NO.:  [id number]   MEDICAL RECORD NO.:  [id number]                   PATIENT TYPE:  MAT   LOCATION:  MATC                                 FACILITY:  [facility]   PHYSICIAN:  Timothy P. Fontaine, M.D.           DATE OF BIRTH:  02/22/1971   DATE OF ADMISSION:  01/24/2002  DATE OF DISCHARGE:                                HISTORY & PHYSICAL   CHIEF COMPLAINT:  Upper back pain.   HISTORY OF PRESENT ILLNESS:  A 40 year old G71, P56 female at [redacted] weeks  gestation.  A complicated prenatal course, to include gestational diabetes  (insulin-dependent), asthma, hypertension, anxiety disorder, prior cesarean  section, prior myomectomy for repeat cesarean section.  She enters  complaining of upper mid back pain.  She does have a history of prior  preeclampsia with a prior pregnancy, and she is concerned that this may be a  symptom. She presents for evaluation.  Initial evaluation in the office  showed a blood pressure in the 140's/90's and she was sent to triage for  further evaluation.   PHYSICAL EXAMINATION:  HEENT:  Normal.  LUNGS:  Clear.  CARDIAC:  Regular rate and rhythm, without rubs, murmurs or gallops.  ABDOMEN:  Benign, gravid, obese.  Positive fetal heart tones.  PELVIC:  Cervix long, closed, high.  NEUROLOGIC:  DTR's 1-2+ without clonus.  FETAL:  External monitor reactive without contractions.   LABORATORY DATA:  Showing normal PIH panels with normal pathology U/A.  No  proteinuria.  Normal CBC with platelet count.   ASSESSMENT:  A 40 year old G2, P87 female at 74 weeks.  Rule out pre-  eclampsia with normal lab values on serial blood pressure monitoring.  She  was 131/81, 126/77 and 123/71.  The patient was counseled as to the signs  and symptoms of toxemia, to include headaches, epigastric pain, nausea,  vomiting, blurred vision, acute weight gain.  The patient will be sent home  to rest.  She has a follow-up appointment Monday and  will follow up at that  time at her follow up appointment, and will follow up sooner if any atypical  or any unusual symptoms present themselves.                                               Timothy P. Audie Box, M.D.    TPF/MEDQ  D:  01/24/2002  T:  01/24/2002  Job:  [id number]

## 2010-10-22 NOTE — Op Note (Signed)
Peggy Austin, Peggy Austin                 ACCOUNT NO.:  [id number]   MEDICAL RECORD NO.:  [id number]          PATIENT TYPE:  AMB   LOCATION:  SDC                           FACILITY:  [facility]   PHYSICIAN:  Juan H. Lily Peer, M.D.DATE OF BIRTH:  Dec 31, 1970   DATE OF PROCEDURE:  01/27/2006  DATE OF DISCHARGE:  01/27/2006                                 OPERATIVE REPORT   INDICATIONS FOR OPERATION:  40 year old gravida 2, para 2 with long standing  history of menorrhagia recently was diagnosed with CIN1 of the  cervicovaginal region at the 6 o'clock position and benign endometrial  biopsy and benign ECC.   PREOPERATIVE DIAGNOSIS:  1. Menorrhagia.  2. Cervical dysplasia, CIN1.   POSTOPERATIVE DIAGNOSIS:  1. Menorrhagia.  2. Cervical dysplasia, CIN1.   ANESTHESIA:  General endotracheal anesthesia.   PROCEDURES PERFORMED:  1. Diagnostic hysteroscopy.  2. Endometrial ablation NovaSure technique.  3. CO2 laser ablation of cervicovaginal dysplasia.   DESCRIPTION OF OPERATION:  After the patient was adequately counseled, she  was taken to the operating room where she underwent successful general  endotracheal anesthesia.  The patient had PSA stockings for DVT prophylaxis  and also received clindamycin 900 mg IV. She was placed in the high  lithotomy position.  The vagina and perineum were prepped and draped in  usual sterile fashion.  A red rubber Roxan Hockey was inserted into the bladder  to evacuate its contents for an evacuation of approximately 50 mL of urine.  The Graves speculum was inserted into the vaginal vault.  The cervix and  vagina was cleansed with Betadine solution.  The anterior cervical lip was  grasped with a single-tooth tenaculum.  The uterus sounded to 8 cm, the  cervical length measurement was 3 cm, cavity length was measured at 5 cm.  The cervix was dilated to an 8 mm size Pratt dilator and the NovaSure  endometrial ablation device was introduced into the intrauterine  cavity  after the appropriate measurements were placed into the generator.  The  endometrium was ablated for 90 seconds at 91 watts.  Afterwards, the  instruments were removed.  Of note, prior to the ablation, a diagnosis  hysteroscopy was performed using normal saline as a distending media.  Both  tubal ostia had been identified and no endometrial polyps were seen.  No  abnormalities in the cervical canal was noted.  So once this completed, the  speculum was changed to a titanium coated Graves speculum with a plume  extractor. The cervix was cleansed with acetic acid to visualize the  previous biopsy site and the CO2 laser was set at 6 watts and the ectocervix  was ablated to a depth of 3 mm to include the level of the transformation  zone.  The area of the posterior vaginal fornix was suspicious for VIN1 and  based on biopsy, the area was laser ablated, as well, to a  depth of 2 mm.  Monsel solution was used for hemostasis followed by  placement of Premarin cream.  The patient was extubated and transferred to  the recovery room with  stable vital signs.  Blood loss was minimal.  Blood  replaced consisted of 1200 mL of lactated ringers.      Juan H. Lily Peer, M.D.  Electronically Signed     JHF/MEDQ  D:  01/27/2006  T:  01/27/2006  Job:  [id number]

## 2010-10-22 NOTE — Op Note (Signed)
NAMEMAIKA, MCELVEEN NO.:  [id number]   MEDICAL RECORD NO.:  [id number]                    PATIENT TYPE:   LOCATION:                                       FACILITY:   PHYSICIAN:  Jene Every, M.D.                 DATE OF BIRTH:   DATE OF PROCEDURE:  DATE OF DISCHARGE:                                 OPERATIVE REPORT   PREOPERATIVE DIAGNOSIS:  Recurrent herniated nucleus pulposus L5-S1, left.   POSTOPERATIVE DIAGNOSIS:  Recurrent herniated nucleus pulposus L5-S1, left.   PROCEDURES PERFORMED:  Redo of microdiskectomy and lateral recess  decompression with foraminotomy L5 and S1, left.   ANESTHESIA:  General.   SURGEON:  Javier Docker, M.D.   ASSISTANT:  Georges Lynch. Gioffre, M.D.   BRIEF HISTORY AND INDICATIONS:  A 40 year old with recurrent disk  herniation, intermittent urinary retention, MRI indicating large recurrent  disk herniation with severe distortion of the thecal sac, S1 neuropathy.  Due to the above-mentioned, operative intervention is indicated for  decompression of the S1 nerve root to avoid a subsequent cauda equina  syndrome as well as decompression of the S1 and S2 nerve roots.  Risks and  benefits discussed including bleeding, infection, damage to vascular  structures, CSF leakage, epidural fibrosis, adjacent segment disease, etc.   TECHNIQUE:  The patient in supine position.  After the induction of adequate  general anesthesia and 1 g of Kefzol, she was placed prone on the Wilson  frame due to her marked obesity.  After careful positioning, all bony  prominences well-padded.  Lumbar region is prepped and draped in the usual  sterile fashion.  Previous surgical incision was utilized and extended  cephalad and caudad with the previous surgical scar excised.  Subcutaneous  tissue was dissected, electrocautery utilized to achieve hemostasis.  The  dorsolumbar fascia identified and divided in line with the skin incision.  Paraspinous muscle elevated from the lamina of 5 and S1.  Preliminary Kocher  on the spinous process of 5 was unpenetrated by x-ray due to the patient's  size.  Therefore, placed a Insurance claims handler and the Adrian 4 in the  disk space, and the radiograph demonstrated again what appeared to be the L5-  S1 disk space by x-ray.  Although again, technique was suboptimal due to her  size.  A previous hemilaminotomy at 5 and at S1 utilizing the 2 and the 3 mm  Kerrison after careful utilization of a curette to mobilize the tissues.  Performed a partial medial and hemifacetectomy as well to enlarge a  hemilaminotomy.  Identified the S1 nerve root in the lateral recess in the  foramen, and it was displaced laterally.  Had to remove some ligamentum  flavum from the midline between the spinous processes.  Since we were unable  to mobilize the nerve root, as it was extremely tender, erythematous, and  edematous.  First went laterally and placed a nerve hook underneath the  nerve root, protection of the nerve root, and retrieved two small fragments.  There was some slightly betty mobility.  We then explored the axilla of the  root with this large extruded fragment was noted.  Made a small incision in  the casing to this, and a large HNP of mottled fragments were retrieved from  within the axilla and beneath the thecal sac.  We did not use retraction of  the thecal sac at all throughout the case.  After multiple fragments were  retrieved, we then again went laterally, gently retracted the S1 nerve root  medially and performed the remainder of the diskectomy in the disk space.  There was no residual disk material after the thorough diskectomy was  performed.  We checked the foramen of 4 and 5 and down the axilla.  There  was no residual disk herniation.  We copiously irrigated the disk space.  Bipolar electrocautery was utilized to achieve strict hemostasis.  Performed  a Valsalva maneuver with no  evidence of CSF leakage or active bleeding.  Again, thoroughly irrigated the disk space.  The nerve root was found to be  erythematous and edematous and freely mobile.  We gave the patient 8 mg IV  of Decadron.  Next, we removed the [facility] and the operating microscope.  Paraspinous muscles inspected with no evidence of active bleeding.  Dorsolumbar fascia reapproximated with #1 Vicryl interrupted figure-of-eight  sutures, subcutaneous tissue reapproximated with 2-0 Vicryl simple sutures.  Skin was reapproximated with staples.  Wound was dressed sterilely.  She was  placed supine on the hospital bed, extubated without difficulty, and  transported to the recovery room in satisfactory condition.   The patient tolerated the procedure well with no complications.                                               Jene Every, M.D.    Cordelia Pen  D:  01/02/2003  T:  01/03/2003  Job:  [id number]

## 2010-10-22 NOTE — Consult Note (Signed)
[place]  Patient:    Peggy Austin                           MRN: [id number] Proc. Date: 05/14/99 Adm. Date:  [id number] Attending:  Lorre Nick CC:         Tera Mater. Evlyn Kanner, M.D.             Thad Ranger, M.D                          Consultation Report  REASON FOR CONSULTATION:  Dr. Evlyn Kanner asked me to see this 40 year old female because of abdominal pain, nausea, vomiting, and a dilated bile duct.  HISTORY:  Peggy Austin has had several episodes of recurring abdominal symptoms over the past several months, characterized by the abrupt onset of protracted vomiting and mild diarrhea.  She was feeling fine until 5 p.m. last night, when, about five hours after eating a taco at [facility], she had the abrupt onset again of severe protracted vomiting, probably about 30 times, as well as severe high epigastric  pain and pain under her right breast which seemed to pierce through directly into the right infrascapular area.  There was also a couple of ___________ . DD:  05/14/99 TD:  05/14/99 Job: [id number]

## 2010-10-22 NOTE — Op Note (Signed)
NAME:  Peggy Austin, Peggy Austin                           ACCOUNT NO.:  [id number]   MEDICAL RECORD NO.:  [id number]                   PATIENT TYPE:  INP   LOCATION:  9140                                 FACILITY:  [facility]   PHYSICIAN:  Juan H. Lily Peer, M.D.             DATE OF BIRTH:  Nov 05, 1970   DATE OF PROCEDURE:  02/14/2002  DATE OF DISCHARGE:                                 OPERATIVE REPORT   SURGEON:  Juan H. Lily Peer, M.D.   FIRST ASSISTANT:  Sheronette A. Cherly Hensen, M.D.   INDICATIONS:  A 40 year old gravida 2, para 1 at [redacted] weeks gestation.  The  patient with previous cesarean section for repeat.  Has been complaining of  worsening lumbosacral pain as a result of a previous history of a herniated  disk at L4-L5 and patient with gestational diabetes on split regimen daily  insulin who has been followed in the office with antepartum testing and had  an amniocentesis of fetal lung maturity on September 10 which demonstrated  evidence of PG and a strong LS ratio.   PREOPERATIVE DIAGNOSES:  1. Maternal obesity.  2. A 42 week intrauterine pregnancy/mature fetal lung profile.  Recent     amniocentesis.  3. Previous cesarean section for repeat.  4. Request for elective permanent sterilization.  5. Gestational diabetes on insulin.  6. Severe lumbosacral pain.  7.  Chronic hypertension.   POSTOPERATIVE DIAGNOSES:  1. Maternal obesity.  2. A 42 week intrauterine pregnancy/mature fetal lung profile.  Recent     amniocentesis.  3. Previous cesarean section for repeat.  4. Request for elective permanent sterilization.  5. Gestational diabetes on insulin.  6. Severe lumbosacral pain.  7. Chronic hypertension.   PROCEDURE PERFORMED:  1. Repeat lower uterine segment transverse cesarean section.  2. Bilateral tubal sterilization procedure Pomeroy technique.  3. Placement of suprafascial JP drain.   FINDINGS:  A viable female infant.  Apgars of 8/9.  Weight 6 pounds 10  ounces.   DESCRIPTION OF OPERATION:  After the patient was adequately counseled she  was taken to the operating room where she underwent an epidural anesthesia  placement after several attempts with a spinal anesthesia being  unsuccessful.  The abdomen, vagina, and perineum were prepped and draped in  the usual sterile fashion.  A Foley catheter was inserted to monitor urinary  output.  A Pfannenstiel skin incision was made adjacent to the previous  Pfannenstiel scar.  The skin was carried down to the skin, subcutaneous  tissues, down to the rectus fascia whereby a midline nick was made.  The  fascia was incised in a transverse fashion.  The midline raphe was entered.  The peritoneal cavity was entered cautiously.  The bladder flap was  established.  The lower uterine segment was incised in a transverse fashion.  Clear amniotic fluid was present.  The newborn's head was delivered and the  nasopharyngeal area was  bulb suctioned.  The remainder of the baby was  delivered.  The cord was doubly clamped and excised and gave an immediate  cry.  Was shown to the parents and passed off to the pediatricians who gave  the above mentioned parameters.  Pitocin drip was started.  The patient  received 2 g of Cefotan.  The placenta was delivered after cord blood was  obtained and was submitted for histological evaluation.  The uterus was  exteriorized.  The intrauterine cavity was swept clear of remaining products  of conception.  The lower uterine transverse incision was reapproximated  with a running locking stitch of 0 Vicryl suture.  Once this was  accomplished attention was placed to the proximal one-third portion of the  left fallopian tube which was placed under tension with a Babcock clamp and  a 2 cm segment was suture ligated with 3-0 Vicryl x2 and the segment was  excised and passed off the operative field and the remaining stumps were  Bovie cauterized.  Similar procedure was carried out on the  contralateral  side.  The uterus was then placed back into the pelvic cavity.  The pelvic  cavity was copiously irrigated with normal saline solution.  The pelvic  cavity was inspected.  Adequate hemostasis was evident.  The  visceroperitoneum was not reapproximated.  The pyramidalis muscles were  reapproximated with interrupted sutures of 3-0 Vicryl suture.  The rectus  fascia was then closed with a running stitch of 0 Vicryl suture.  A 10  [place] JP drain was placed suprafascial with a separate stab exit wound  which elevated the edge of the incision was secured with 3-0 silk suture.  The skin was then reapproximated with skin clips followed by placement of  pressure dressing after Xeroform gauze.  The patient was transferred to the  recovery room with stable vital signs.  Blood loss was 500 cc.  IV fluids  was 2900 cc of lactated Ringer's.  Urine output 100 cc.  She received 2 g of  Cefotan IV and the capillary blood glucose in the recovery room was 109.                                               Juan H. Lily Peer, M.D.    JHF/MEDQ  D:  02/14/2002  T:  02/15/2002  Job:  [id number]

## 2010-10-22 NOTE — Op Note (Signed)
[facility]  Patient:    Peggy Austin, Peggy Austin                        MRN: [id number] Proc. Date: 04/24/00 Adm. Date:  [id number] Attending:  Sharon Mt                           Operative Report  PREOPERATIVE DIAGNOSIS:  Menorrhagia with endometrial polyp.  POSTOPERATIVE DIAGNOSIS:  Menorrhagia with endometrial polyp.  OPERATION:  Hysteroscopy, dilation and curettage.  SURGEON:  Daniel L. Eda Paschal, M.D.  ANESTHESIA:  General.  INDICATIONS:  The patient is a 40 year old gravida 1, para 1, ab 0, who presented to the office with a three month history of severe menorrhagia when she had her period.  Sonohysterogram was performed, and it revealed a small endometrial polyp of about 1 cm.  She now enters the hospital for hysteroscopy and excision of the polyp.  FINDINGS:  External and vaginal is within normal limits.  Cervix is clean. Uterus is anteverted, normal size and shape.  Adnexa are not palpably enlarged.  At the time of hysteroscopy, the patient had a small endometrial polyp coming off the lateral wall of the uterus.  The walls of the uterus were extremely hyperemic.  There was significant bleeding from blood vessels throughout the cavity consistent with a very vascular state.  Other than the polyp seen coming off the lateral wall of the uterus, the top of the fundus, tubal ostia, anterior and posterior walls of the fundus, lower uterine segment, and the cervical canal were free of any disease.  DESCRIPTION OF PROCEDURE:  After adequate general anesthesia, the patient was placed in the dorsal lithotomy position and prepped and draped in the usual sterile manner.  A single-tooth tenaculum was placed in the anterior lip of the cervix.  The cervix was dilated to a #35 Pratt dilator, and then hysteroscopic examination was done with the hysteroscopic resectoscope and a camera for magnification.  Sorbitol 3% was used to expand the  intrauterine cavity.  Initially it was difficult to get a good view because the patient had severe hyperemia of the endometrium and either from the dilatation or moving the hysteroscope around, she had multiple vessels that bled.  Some of them actually were cauterized with the wire loop at 70 coag 110 cutting, blend 1, and this slowed down the bleeding somewhat.  Finally decent view was obtained, and the endometrial polyp could be seen.  It was difficult to resect it; however, because it was enough bleeding continuing to go on that the surgeon never felt completely comfortable using the wire loop on a cutting current to across of it because of decreased view compared to what is normally seen.  It was therefore felt it might be safer just to use a curette and to resect it since we knew exactly where it was.  An endometrial curette was used, and the polyp was found and removed.  She was re-hysteroscoped, and there was no evidence of the polyp in the area where it was previously.  At this point, the procedure was terminated.  Estimated blood loss was approximately 100 cc with none replaced.  Fluid deficit was 0.  The patient tolerated the procedure well and left the operating room in satisfactory condition. DD:  04/24/00 TD:  04/25/00 Job: [id number]

## 2010-10-22 NOTE — H&P (Signed)
NAME:  Peggy Austin, Peggy Austin                           ACCOUNT NO.:  [id number]   MEDICAL RECORD NO.:  [id number]                   PATIENT TYPE:  INP   LOCATION:  9198                                 FACILITY:  [facility]   PHYSICIAN:  Juan H. Lily Peer, M.D.             DATE OF BIRTH:  Mar 15, 1971   DATE OF ADMISSION:  02/14/2002  DATE OF DISCHARGE:                                HISTORY & PHYSICAL   CHIEF COMPLAINT:  Intrauterine pregnancy at 89 weeks estimated gestational  age and gestational diabetic on insulin complaining of severe back pains,  history of L4 and L5 diskectomy, previous cesarean section, narrow  pelvimetry, and recent amniocentesis for fetal lung maturity with an LS  ration of 4.2:1 with PG present.   HISTORY OF PRESENT ILLNESS:  The patient presents to [facility] this  afternoon here complaining of severe low-back pain.  She has some numbness  and tingling in the left side of her face.  She is scheduled for a repeat  cesarean section next week.  The reason the amniocentesis was moved up  secondary to severe low-back pain.  She had been under the care of Dr.  Jene Every who had operated on her several years ago.  The patient's  prenatal history is also significant for the fact that she has had a history  of hypertension for which she has been on Aldomet 500 mg b.i.d.  she had  been on Pulmicort p.r.n. for asthma as well as albuterol inhaler.  Also for  anxiety attacks she takes Paxil 20 mg twice a day.  She has gestational  diabetes on insulin this pregnancy.  This morning was her last insulin dose;  she had taken 7 units of regular insulin and 22 units of NPH.  Hemastix  blood sugar on admission is 78.   PAST MEDICAL HISTORY:  The patient had a primary cesarean section at 36  weeks, 6 pounds 6 ounces, in 1999 secondary to [facility]; and, she has history of  hypertension, asthma and anxiety attacks.  She had gastric bypass in 1999.  Pneumonia in 1996 and her diskectomy  was in 1999.  She had also a laparotomy  with fibroids removed in 2001.  The patient is over 300 pounds.   REVIEW OF SYSTEMS:  See Hollister form.   PHYSICAL EXAMINATION:   VITAL SIGNS:  Blood pressure 147/87, pulse 94 and respirations 14.   HEENT:  Unremarkable.  There is no facial motor or sensory deficits.  Cranial nerves II-XII are intact.   LUNGS:  Clear to auscultation without rhonchi or wheezes.   HEART:  Regular rate and rhythm.  No murmurs or gallops.   BREASTS:  Examination not done.   ABDOMEN:  Pendulous, Pfannenstiel scar.   PELVIC EXAMINATION:  Cervix long, closed, posterior and very narrow pelvis.   EXTREMITIES:  Deep tendon reflexes 1+.  Trace edema.   LABORATORY DATA:  The patient's admission labs:  CBC is 9.8, hematocrit 29.0  and platelet count 196,000.  Comprehensive metabolic panel pending at the  time of dictation.  Blood type is O negative, negative antibody screen.  RhoGAM was received at 18.[redacted] weeks gestation when she had an amnio secondary  to abnormal maternal serum alpha fetoprotein, which the results came back  with normal chromosome 46 XY and normal amniotic fluid AFP.  VDRL was  nonreactive.  Rubella immune.  Hepatitis B surface antigen and HIV  were  negative.  Alpha fetoprotein abnormally elevated as described above.  Genetic amnio as described above.  Pap smear was normal.   ASSESSMENT:  [age]-year-old gravida 2, para 1, AB 1 obese female, 300+  pounds with gestational diabetes on insulin who is scheduled to undergo a  cesarean section, repeat, at [redacted] weeks gestation.  The process was to be  accelerated based on patient's symptoms of severe lumbosacral pain due to  past history of L4, L5 diskectomy and possible herniation of disk in her  back; has been under the care of Dr. Shelle Iron, which will follow postdelivery  for further treatment.  The patient had another amniocentesis yesterday in  our office, which demonstrated mature fetal lung  profile with an LS ratio of  4.2:1 with PG being present.  The patient had complained of some tenderness  in her left face after she woke up from taking a nap, but it may have been  some compression of nerve because her cranial nerves II-XII were intact on  examination.  She does not have any slurred speech or any end-dropping of  the eye or eyelids.  On the event that she may have an underlying early  Bell's palsy, clinically this is not determined and her vital signs are  fine.   We will go ahead and proceed with a repeat cesarean section since we have  fetal lung profile that is mature; and, patient has requested also for tubal  sterilization for which she was counseled before in the office as to the  risks, benefits, pros and cons, and failure rate.  All the above were  discussed in detail with patient and her husband.  All questions were  answered.   PLAN:  The patient is scheduled for repeat cesarean section along with  bilateral tubal sterilization procedure this afternoon.                                               Juan H. Lily Peer, M.D.    JHF/MEDQ  D:  02/14/2002  T:  02/14/2002  Job:  [id number]

## 2010-10-22 NOTE — Discharge Summary (Signed)
NAME:  Peggy Austin, Peggy Austin                           ACCOUNT NO.:  [id number]   MEDICAL RECORD NO.:  [id number]                   PATIENT TYPE:  EMS   LOCATION:  MINO                                 FACILITY:  [facility]   PHYSICIAN:  Donnetta Hutching                          DATE OF BIRTH:  1970-07-10   DATE OF ADMISSION:  02/14/2002  DATE OF DISCHARGE:  02/18/2002                                 DISCHARGE SUMMARY   DISCHARGE DIAGNOSES:  1. Intrauterine pregnancy, 36 weeks', delivered.  2. Mature fetal lung profile by amniocentesis.  3. Maternal obesity.  4. Prior cesarean section for repeat.  5. Request for elective permanent sterilization.  6. Gestational diabetes on insulin.  7. Severe lumbosacral pain.  8. Chronic hypertension.  9. Rh negative.  10.      Bell's palsy.  11.      Status post repeat lower uterine segment transverse cesarean     section, bilateral tubal sterilization procedure, Pomeroy technique,     placement of superficial JP drain by Dr. Lily Peer on February 14, 2002.   HISTORY AND PHYSICAL:  This is a 40 year old female G2, P9, with EDC of  March 12, 2002.  Prenatal course uncomplicated.  Patient Rh negative,  received RhoGAM.  She also has asthma and chronic hypertension.  Gestational  diabetes on insulin.  She has history of anxiety attacks.  Desire for lower  uterine segment cesarean section and permanent sterilization.  She had  severe back pain with history of L4,5 diskectomy and, therefore, underwent  amniocentesis for lung maturity which revealed mature lungs.  Therefore, on  February 14, 2002, the patient was admitted for cesarean section.  It was  noted on the day the patient was admitted that the patient has had symptoms  consistent with Bell's palsy at the left side of her face.   HOSPITAL COURSE:  On February 14, 2002, the patient was admitted and  underwent early repeat lower uterine segment transverse cesarean section and  bilateral tubal sterilization  procedure, Pomeroy technique, and placement of  superficial JP drain by Dr. Lily Peer.  On February 14, 2002 she underwent  delivery of a female infant weighing 6 pounds 10 ounces.  There were no  complications.  Postoperatively, the patient was afebrile.  She did have a  reaction to Dilaudid with itching and shortness of breath.  We gave her  Nubain, which resolved the problem.  Patient complained of panic attack on  February 17, 2002.  She was treated with Xanax 0.1 mg p.r.n.  On February 18, 2002, the patient was afebrile, voiding and in stable condition and was  discharged to home in satisfactory condition.   LABORATORY DATA:  Patient is O negative, rubella immune.  On February 15, 2002, hemoglobin 8.6, hematocrit 25.4.   DISPOSITION:  Patient discharged to home.  FOLLOW UP:  She is to follow up in six weeks.  If any problems prior to that  time she is to be seen in the office.  She was given Percocet p.r.n. pain.  She was given RhoGAM prior to discharge.      Susa Loffler, P.A.                    Donnetta Hutching    TSG/MEDQ  D:  03/22/2002  T:  03/22/2002  Job:  [id number]

## 2010-10-22 NOTE — Op Note (Signed)
   NAME:  Peggy Austin, Peggy Austin                           ACCOUNT NO.:  [id number]   MEDICAL RECORD NO.:  [id number]                   PATIENT TYPE:  INP   LOCATION:  9140                                 FACILITY:  [facility]   PHYSICIAN:  Juan H. Lily Peer, M.D.             DATE OF BIRTH:  07/09/1970   DATE OF PROCEDURE:  DATE OF DISCHARGE:                                 OPERATIVE REPORT   INDICATIONS:  A 40 year old gravida 2, para 1 at [redacted] weeks gestation with  gestational diabetes on insulin with severe back pain, history of L4-L5  herniated disk, also obesity and previous cesarean section.  Was scheduled  for a repeat cesarean section in three weeks.   Dictation ended at this point.                                               Juan H. Lily Peer, M.D.    JHF/MEDQ  D:  02/14/2002  T:  02/15/2002  Job:  [id number]

## 2010-10-22 NOTE — Assessment & Plan Note (Signed)
[facility]                                 ON-CALL NOTE   [name], [name]                         MRN:          [id number]  DATE:01/21/2008                            DOB:          [birth year]    PHONE NUMBER:  [phone]   CALLER:  The patient.   TIME OF THE CALL:  9:10 p.m.   I was contacted this evening by Michae Kava.  She was, apparently, in  the hospital last week by Dr. Leone Payor for possible pancreatitis.  She  has had prior gastric bypass surgery.  She was, apparently, advised by  Dr. Leone Payor that she needed to return to [facility] for a special  type of scope.  She called this evening complaining of low blood  pressure and abdominal swelling.  She states symptoms are similar to  when she was hospitalized.  I pulled up the electronic medical record,  and there is no indication that the patient has been seen at our office.  She states that she has called [facility] several times and has not  received a call back.  She is feeling like she needs to go to [facility] to be evaluated and wanted to discuss first her situation with  a physician.  I told her that sounds quite appropriate that she proceed  to [facility] emergency room for evaluation, and if urgent service  is needed, then they could accommodate her accordingly.  She was  grateful and agreeable.     Wilhemina Bonito. Marina Goodell, MD  Electronically Signed    JNP/MedQ  DD: 01/21/2008  DT: 01/22/2008  Job #: [id number]   cc:   Iva Boop, MD,FACG

## 2010-12-09 ENCOUNTER — Emergency Department (HOSPITAL_BASED_OUTPATIENT_CLINIC_OR_DEPARTMENT_OTHER)
Admission: EM | Admit: 2010-12-09 | Discharge: 2010-12-09 | Disposition: A | Discharge status: Home / Self Care | Payer: Medicaid Other | Attending: Emergency Medicine | Admitting: Emergency Medicine

## 2010-12-09 ENCOUNTER — Emergency Department (INDEPENDENT_AMBULATORY_CARE_PROVIDER_SITE_OTHER): Payer: Medicaid Other

## 2010-12-09 DIAGNOSIS — R112 Nausea with vomiting, unspecified: Secondary | ICD-10-CM

## 2010-12-09 DIAGNOSIS — R1012 Left upper quadrant pain: Secondary | ICD-10-CM

## 2010-12-09 DIAGNOSIS — K589 Irritable bowel syndrome without diarrhea: Secondary | ICD-10-CM | POA: Insufficient documentation

## 2010-12-09 DIAGNOSIS — Z79899 Other long term (current) drug therapy: Secondary | ICD-10-CM | POA: Insufficient documentation

## 2010-12-09 DIAGNOSIS — K7689 Other specified diseases of liver: Secondary | ICD-10-CM

## 2010-12-09 DIAGNOSIS — J45909 Unspecified asthma, uncomplicated: Secondary | ICD-10-CM | POA: Insufficient documentation

## 2010-12-09 LAB — CBC
HCT: 35 % — ABNORMAL LOW (ref 36.0–46.0)
Hemoglobin: 11.7 g/dL — ABNORMAL LOW (ref 12.0–15.0)
MCH: 29.2 pg (ref 26.0–34.0)
MCHC: 33.4 g/dL (ref 30.0–36.0)
MCV: 87.3 fL (ref 78.0–100.0)
Platelets: 280 10*3/uL (ref 150–400)
RBC: 4.01 MIL/uL (ref 3.87–5.11)
RDW: 14 % (ref 11.5–15.5)
WBC: 5.5 10*3/uL (ref 4.0–10.5)

## 2010-12-09 LAB — COMPREHENSIVE METABOLIC PANEL WITH GFR
ALT: 18 U/L (ref 0–35)
AST: 24 U/L (ref 0–37)
Albumin: 3.3 g/dL — ABNORMAL LOW (ref 3.5–5.2)
Alkaline Phosphatase: 57 U/L (ref 39–117)
BUN: 15 mg/dL (ref 6–23)
CO2: 29 meq/L (ref 19–32)
Calcium: 9.4 mg/dL (ref 8.4–10.5)
Chloride: 102 meq/L (ref 96–112)
Creatinine, Ser: 0.6 mg/dL (ref 0.50–1.10)
GFR calc Af Amer: >60 mL/min
GFR calc non Af Amer: >60 mL/min
Glucose, Bld: 97 mg/dL (ref 70–99)
Potassium: 4.1 meq/L (ref 3.5–5.1)
Sodium: 138 meq/L (ref 135–145)
Total Bilirubin: 0.2 mg/dL — ABNORMAL LOW (ref 0.3–1.2)
Total Protein: 6.4 g/dL (ref 6.0–8.3)

## 2010-12-09 LAB — LACTIC ACID, PLASMA: Lactic Acid, Venous: 0.8 mmol/L (ref 0.5–2.2)

## 2010-12-09 LAB — DIFFERENTIAL
Eosinophils Absolute: 0.1 10*3/uL (ref 0.0–0.7)
Lymphocytes Relative: 31 % (ref 12–46)
Lymphs Abs: 1.7 10*3/uL (ref 0.7–4.0)
Monocytes Relative: 8 % (ref 3–12)
Neutro Abs: 3.2 10*3/uL (ref 1.7–7.7)
Neutrophils Relative %: 58 % (ref 43–77)

## 2010-12-09 LAB — URINALYSIS, ROUTINE W REFLEX MICROSCOPIC
Bilirubin Urine: NEGATIVE
Nitrite: NEGATIVE
Protein, ur: NEGATIVE mg/dL
Urobilinogen, UA: 0.2 mg/dL (ref 0.0–1.0)

## 2010-12-09 LAB — URINE MICROSCOPIC-ADD ON

## 2010-12-09 LAB — LIPASE, BLOOD: Lipase: 23 U/L (ref 11–59)

## 2010-12-09 MED ORDER — IOHEXOL 300 MG/ML  SOLN
100.0000 mL | Freq: Once | INTRAMUSCULAR | Status: AC | PRN
  Administered 2010-12-09: 100 mL via INTRAVENOUS

## 2011-01-03 ENCOUNTER — Telehealth (HOSPITAL_BASED_OUTPATIENT_CLINIC_OR_DEPARTMENT_OTHER): Payer: Self-pay | Admitting: Emergency Medicine

## 2011-01-03 ENCOUNTER — Emergency Department (HOSPITAL_BASED_OUTPATIENT_CLINIC_OR_DEPARTMENT_OTHER)
Admission: EM | Admit: 2011-01-03 | Discharge: 2011-01-03 | Disposition: A | Discharge status: Home / Self Care | Payer: Medicaid Other | Attending: Emergency Medicine | Admitting: Emergency Medicine

## 2011-01-03 ENCOUNTER — Emergency Department (INDEPENDENT_AMBULATORY_CARE_PROVIDER_SITE_OTHER): Payer: Medicaid Other

## 2011-01-03 ENCOUNTER — Encounter: Payer: Self-pay | Admitting: *Deleted

## 2011-01-03 DIAGNOSIS — R112 Nausea with vomiting, unspecified: Secondary | ICD-10-CM | POA: Insufficient documentation

## 2011-01-03 DIAGNOSIS — J45909 Unspecified asthma, uncomplicated: Secondary | ICD-10-CM | POA: Insufficient documentation

## 2011-01-03 DIAGNOSIS — R1032 Left lower quadrant pain: Secondary | ICD-10-CM | POA: Insufficient documentation

## 2011-01-03 DIAGNOSIS — K589 Irritable bowel syndrome without diarrhea: Secondary | ICD-10-CM | POA: Insufficient documentation

## 2011-01-03 DIAGNOSIS — D259 Leiomyoma of uterus, unspecified: Secondary | ICD-10-CM | POA: Insufficient documentation

## 2011-01-03 DIAGNOSIS — R1084 Generalized abdominal pain: Secondary | ICD-10-CM

## 2011-01-03 DIAGNOSIS — R1031 Right lower quadrant pain: Secondary | ICD-10-CM

## 2011-01-03 DIAGNOSIS — R197 Diarrhea, unspecified: Secondary | ICD-10-CM

## 2011-01-03 HISTORY — DX: Unspecified abdominal hernia without obstruction or gangrene: K46.9

## 2011-01-03 LAB — CBC
MCH: 28.9 pg (ref 26.0–34.0)
MCHC: 33.9 g/dL (ref 30.0–36.0)
MCV: 85.2 fL (ref 78.0–100.0)
Platelets: 316 10*3/uL (ref 150–400)
RBC: 4.26 MIL/uL (ref 3.87–5.11)
RDW: 13.4 % (ref 11.5–15.5)

## 2011-01-03 LAB — DIFFERENTIAL
Basophils Absolute: 0 10*3/uL (ref 0.0–0.1)
Basophils Relative: 0 % (ref 0–1)
Eosinophils Absolute: 0.1 10*3/uL (ref 0.0–0.7)
Eosinophils Relative: 1 % (ref 0–5)
Lymphs Abs: 1.4 10*3/uL (ref 0.7–4.0)
Neutrophils Relative %: 74 % (ref 43–77)

## 2011-01-03 LAB — URINALYSIS, ROUTINE W REFLEX MICROSCOPIC
Bilirubin Urine: NEGATIVE
Nitrite: NEGATIVE
Specific Gravity, Urine: >1.046 — ABNORMAL HIGH (ref 1.005–1.030)
Urobilinogen, UA: 0.2 mg/dL (ref 0.0–1.0)
pH: 6 (ref 5.0–8.0)

## 2011-01-03 LAB — COMPREHENSIVE METABOLIC PANEL
ALT: 30 U/L (ref 0–35)
AST: 36 U/L (ref 0–37)
Albumin: 3.3 g/dL — ABNORMAL LOW (ref 3.5–5.2)
Alkaline Phosphatase: 54 U/L (ref 39–117)
Calcium: 9.4 mg/dL (ref 8.4–10.5)
GFR calc Af Amer: >60 mL/min (ref >60)
Potassium: 3.3 mEq/L — ABNORMAL LOW (ref 3.5–5.1)
Sodium: 138 mEq/L (ref 135–145)
Total Protein: 6.4 g/dL (ref 6.0–8.3)

## 2011-01-03 LAB — URINE MICROSCOPIC-ADD ON

## 2011-01-03 LAB — LIPASE, BLOOD: Lipase: 46 U/L (ref 11–59)

## 2011-01-03 MED ORDER — HYDROCODONE-ACETAMINOPHEN 5-325 MG PO TABS: 2.0000 | ORAL_TABLET | ORAL | Status: AC | PRN

## 2011-01-03 MED ORDER — HYDROMORPHONE HCL 1 MG/ML IJ SOLN
1.0000 mg | Freq: Once | INTRAMUSCULAR | Status: AC
  Administered 2011-01-03: 1 mg via INTRAVENOUS
  Filled 2011-01-03: qty 1

## 2011-01-03 MED ORDER — PROMETHAZINE HCL 25 MG RE SUPP: 25.0000 mg | Freq: Four times a day (QID) | RECTAL | Status: DC | PRN

## 2011-01-03 MED ORDER — DICYCLOMINE HCL 20 MG PO TABS: 20.0000 mg | ORAL_TABLET | Freq: Two times a day (BID) | ORAL | Status: DC

## 2011-01-03 MED ORDER — IOHEXOL 300 MG/ML  SOLN
100.0000 mL | Freq: Once | INTRAMUSCULAR | Status: AC | PRN
  Administered 2011-01-03: 100 mL via INTRAVENOUS

## 2011-01-03 MED ORDER — ONDANSETRON HCL 4 MG/2ML IJ SOLN
4.0000 mg | Freq: Once | INTRAMUSCULAR | Status: AC
  Administered 2011-01-03: 4 mg via INTRAVENOUS
  Filled 2011-01-03: qty 2

## 2011-01-03 MED ORDER — ONDANSETRON HCL 4 MG PO TABS: 4.0000 mg | ORAL_TABLET | Freq: Four times a day (QID) | ORAL | Status: AC

## 2011-01-03 NOTE — ED Notes (Signed)
Pt amb to room 4 with slow,steady gait, reports one month of "ibs flare" with abd pain, last night had "explosive" diarrhea, today reports 3 episodes of bowel incont.  Reports nausea x 1 month, emesis x today.

## 2011-01-03 NOTE — ED Provider Notes (Signed)
History     Chief Complaint  Patient presents with  . Abdominal Pain   Patient is a 40 y.o. female presenting with abdominal pain.  Abdominal Pain The primary symptoms of the illness include abdominal pain, nausea, vomiting and diarrhea. The primary symptoms of the illness do not include fever, fatigue, dysuria, vaginal discharge or vaginal bleeding. The current episode started more than 2 days ago. The onset of the illness was gradual. The problem has been gradually worsening.  The abdominal pain began more than 2 days ago. The pain came on gradually. The abdominal pain has been gradually worsening since its onset. The abdominal pain is located in the RLQ. The abdominal pain radiates to the RLQ. The severity of the abdominal pain is 9/10. The abdominal pain is relieved by nothing. The abdominal pain is exacerbated by vomiting and bowel movements.  Nausea began 3 to 5 days ago. The nausea is associated with eating. The nausea is exacerbated by food.  The patient states that she believes she is currently pregnant. Additional symptoms associated with the illness include chills. Symptoms associated with the illness do not include frequency.    Past Medical History  Diagnosis Date  . Asthma   . Hernia     Past Surgical History  Procedure Date  . Gastric bypass   . Cholecystectomy     History reviewed. No pertinent family history.  History  Substance Use Topics  . Smoking status: Not on file  . Smokeless tobacco: Not on file  . Alcohol Use:     OB History    Grav Para Term Preterm Abortions TAB SAB Ect Mult Living                  Review of Systems  Constitutional: Positive for chills. Negative for fever and fatigue.  Gastrointestinal: Positive for nausea, vomiting, abdominal pain and diarrhea.  Genitourinary: Negative for dysuria, frequency, flank pain, vaginal bleeding and vaginal discharge.    Physical Exam  BP 112/80  Pulse 96  Temp(Src) 98 F (36.7 C) (Oral)  Ht  5\' 2"  (1.575 m)  Wt 125 lb (56.7 kg)  BMI 22.86 kg/m2  SpO2 100%  Physical Exam  Constitutional: She is oriented to person, place, and time. She appears well-developed and well-nourished.  HENT:  Head: Normocephalic and atraumatic.  Eyes: Pupils are equal, round, and reactive to light.  Neck: Normal range of motion.  Cardiovascular: Normal rate.   Abdominal: Soft. Bowel sounds are normal. There is tenderness. There is guarding.  Musculoskeletal: Normal range of motion.  Neurological: She is alert and oriented to person, place, and time.  Skin: Skin is warm.  Psychiatric: She has a normal mood and affect.   Results for orders placed during the hospital encounter of 01/03/11  CBC      Component Value Range   WBC 8.0  4.0 - 10.5 (K/uL)   RBC 4.26  3.87 - 5.11 (MIL/uL)   Hemoglobin 12.3  12.0 - 15.0 (g/dL)   HCT 16.1  09.6 - 04.5 (%)   MCV 85.2  78.0 - 100.0 (fL)   MCH 28.9  26.0 - 34.0 (pg)   MCHC 33.9  30.0 - 36.0 (g/dL)   RDW 40.9  81.1 - 91.4 (%)   Platelets 316  150 - 400 (K/uL)  DIFFERENTIAL      Component Value Range   Neutrophils Relative 74  43 - 77 (%)   Neutro Abs 5.9  1.7 - 7.7 (K/uL)   Lymphocytes  Relative 18  12 - 46 (%)   Lymphs Abs 1.4  0.7 - 4.0 (K/uL)   Monocytes Relative 7  3 - 12 (%)   Monocytes Absolute 0.5  0.1 - 1.0 (K/uL)   Eosinophils Relative 1  0 - 5 (%)   Eosinophils Absolute 0.1  0.0 - 0.7 (K/uL)   Basophils Relative 0  0 - 1 (%)   Basophils Absolute 0.0  0.0 - 0.1 (K/uL)  COMPREHENSIVE METABOLIC PANEL      Component Value Range   Sodium 138  135 - 145 (mEq/L)   Potassium 3.3 (*) 3.5 - 5.1 (mEq/L)   Chloride 104  96 - 112 (mEq/L)   CO2 23  19 - 32 (mEq/L)   Glucose, Bld 95  70 - 99 (mg/dL)   BUN 12  6 - 23 (mg/dL)   Creatinine, Ser 1.61  0.50 - 1.10 (mg/dL)   Calcium 9.4  8.4 - 09.6 (mg/dL)   Total Protein 6.4  6.0 - 8.3 (g/dL)   Albumin 3.3 (*) 3.5 - 5.2 (g/dL)   AST 36  0 - 37 (U/L)   ALT 30  0 - 35 (U/L)   Alkaline Phosphatase 54  39  - 117 (U/L)   Total Bilirubin 0.2 (*) 0.3 - 1.2 (mg/dL)   GFR calc non Af Amer >60  >60 (mL/min)   GFR calc Af Amer >60  >60 (mL/min)  PREGNANCY, URINE      Component Value Range   Preg Test, Ur NEGATIVE    URINALYSIS, ROUTINE W REFLEX MICROSCOPIC      Component Value Range   Color, Urine YELLOW  YELLOW    Appearance CLEAR  CLEAR    Specific Gravity, Urine >1.046 (*) 1.005 - 1.030    pH 6.0  5.0 - 8.0    Glucose, UA NEGATIVE  NEGATIVE (mg/dL)   Hgb urine dipstick MODERATE (*) NEGATIVE    Bilirubin Urine NEGATIVE  NEGATIVE    Ketones, ur NEGATIVE  NEGATIVE (mg/dL)   Protein, ur NEGATIVE  NEGATIVE (mg/dL)   Urobilinogen, UA 0.2  0.0 - 1.0 (mg/dL)   Nitrite NEGATIVE  NEGATIVE    Leukocytes, UA NEGATIVE  NEGATIVE   LIPASE, BLOOD      Component Value Range   Lipase 46  11 - 59 (U/L)  URINE MICROSCOPIC-ADD ON      Component Value Range   Squamous Epithelial / LPF RARE  RARE    RBC / HPF 3-6  <3 (RBC/hpf)   Bacteria, UA RARE  RARE    Ct Abdomen Pelvis W Contrast  01/03/2011  *RADIOLOGY REPORT*  Clinical Data: Right lower quadrant abdominal pain.  Previous gastric bypass.  Nausea, vomiting and diarrhea.  Diabetes. Previous hernia repairs.  CT ABDOMEN AND PELVIS WITH CONTRAST  Technique:  Multidetector CT imaging of the abdomen and pelvis was performed following the standard protocol during bolus administration of intravenous contrast.  Contrast: 100 ml Omnipaque-300.  Comparison: 12/09/2010.  Findings: Normal appearing appendix in the posterior aspect of the upper pelvis on the right.  Mild decrease in size of a small cyst in the inferior aspect of the right lobe of the liver.  Stable cholecystectomy clips and post gastric bypass changes.  Better visualized inhomogeneous mass or conglomeration of masses in the uterine fundus, measuring 4.8 x 4.3 cm in maximum dimensions on image number 63.  The ovaries are difficult to delineate from adjacent bowel loops, grossly normal.  Unremarkable spleen,  pancreas, adrenal glands and right kidney.  Small posterior left renal cyst without significant change. Stable biliary ductal dilatation with the common duct measuring 14.2 mm in maximum diameter.  No enlarged lymph nodes.  Clear lung bases. Mild lumbar and lower thoracic spine degenerative changes.  IMPRESSION:  1.  No acute abnormality. 2.  Stable chronic biliary ductal dilatation following cholecystectomy. 3.  Fibroid uterus.  Original Report Authenticated By: Darrol Angel, M.D.   Ct Abdomen Pelvis W Contrast  12/09/2010  *RADIOLOGY REPORT*  Clinical Data: Abdominal pain, nausea, vomiting.  Left upper quadrant pain.  History of gastric bypass.  CT ABDOMEN AND PELVIS WITH CONTRAST  Technique:  Multidetector CT imaging of the abdomen and pelvis was performed following the standard protocol during bolus administration of intravenous contrast.  Contrast: 100 ml Omnipaque 300 IV.  Comparison: 02/04/2010  Findings: Lung bases are clear.  No effusions.  Heart is normal size.  Changes of prior gastric bypass and cholecystectomy noted.  Tiny low density lesion in the inferior aspect of the liver is stable, likely small cyst.  The spleen, pancreas, adrenals and kidneys are normal.  Common bile duct is dilated slight intrahepatic biliary prominence. This stable since prior study, likely related to the post cholecystectomy state.  Recommend correlation with LFTs.  Small bowel is decompressed.  No evidence of bowel obstruction. There is a large volume of stool throughout the colon.  Moderate gaseous distention of portions of the colon.  Gas is seen within the rectum.  No evidence of obstruction.  Appendix is visualized and is normal.  Uterus, adnexa and urinary bladder have a normal appearance.  No acute bony abnormality.  IMPRESSION: Prior gastric bypass and cholecystectomy.  Continued mild biliary ductal dilatation, likely related to the post cholecystectomy state.  Recommend correlation with LFTs.  Large stool burden  throughout the colon with mild gaseous distention.  Findings suggestive of constipation.  Original Report Authenticated By: Cyndie Chime, M.D.     ED Course  Procedures  MDM Pt given Iv pain medications, nausea medications,   Ct scan shows fibroids,  No bowel pathology,  Negative for appendicitis.      Langston Masker, [place] 01/03/11 1715  Langston Masker, [place] 01/03/11 1718  Carleene Cooper III, MD 01/04/11 [phone]

## 2011-02-23 ENCOUNTER — Telehealth (INDEPENDENT_AMBULATORY_CARE_PROVIDER_SITE_OTHER): Payer: Self-pay | Admitting: General Surgery

## 2011-02-25 ENCOUNTER — Encounter (INDEPENDENT_AMBULATORY_CARE_PROVIDER_SITE_OTHER): Payer: Self-pay

## 2011-03-04 LAB — CBC
HCT: 29.2 — ABNORMAL LOW
HCT: 34.7 — ABNORMAL LOW
HCT: 34.9 — ABNORMAL LOW
Hemoglobin: 11.6 — ABNORMAL LOW
Hemoglobin: 9.7 — ABNORMAL LOW
MCHC: 33.6
MCV: 88.5
MCV: 89.3
MCV: 89.7
Platelets: 196
Platelets: 227
RDW: 15.6 — ABNORMAL HIGH
RDW: 15.6 — ABNORMAL HIGH
RDW: 15.9 — ABNORMAL HIGH
RDW: 15.9 — ABNORMAL HIGH
WBC: 3.6 — ABNORMAL LOW

## 2011-03-04 LAB — COMPREHENSIVE METABOLIC PANEL
AST: 197 — ABNORMAL HIGH
Albumin: 3 — ABNORMAL LOW
BUN: 11
Calcium: 8.6
Creatinine, Ser: 0.57
GFR calc Af Amer: >60
Total Protein: 5.4 — ABNORMAL LOW

## 2011-03-04 LAB — POCT URINALYSIS DIP (DEVICE)
Ketones, ur: NEGATIVE
Operator id: 239701
Protein, ur: NEGATIVE
Specific Gravity, Urine: 1.02
Urobilinogen, UA: 0.2
pH: 5.5

## 2011-03-04 LAB — HEPATIC FUNCTION PANEL
ALT: 112 — ABNORMAL HIGH
ALT: 142 — ABNORMAL HIGH
AST: 108 — ABNORMAL HIGH
AST: 29
AST: 33
AST: 50 — ABNORMAL HIGH
Albumin: 2.6 — ABNORMAL LOW
Albumin: 2.7 — ABNORMAL LOW
Albumin: 3 — ABNORMAL LOW
Alkaline Phosphatase: 81
Bilirubin, Direct: <0.1
Bilirubin, Direct: <0.1
Indirect Bilirubin: 0.3
Total Bilirubin: 0.4
Total Bilirubin: 0.5
Total Bilirubin: 0.5
Total Protein: 4.5 — ABNORMAL LOW
Total Protein: 4.5 — ABNORMAL LOW
Total Protein: 5.4 — ABNORMAL LOW

## 2011-03-04 LAB — BASIC METABOLIC PANEL
BUN: 3 — ABNORMAL LOW
CO2: 27
CO2: 29
Calcium: 8.4
Chloride: 107
Chloride: 108
Creatinine, Ser: 0.67
GFR calc non Af Amer: >60
GFR calc non Af Amer: >60
GFR calc non Af Amer: >60
Glucose, Bld: 88
Glucose, Bld: 98
Glucose, Bld: 99
Potassium: 4.1
Potassium: 4.7
Sodium: 137
Sodium: 139

## 2011-03-04 LAB — LIPASE, BLOOD: Lipase: 24

## 2011-03-04 LAB — DIFFERENTIAL
Basophils Absolute: 0
Lymphocytes Relative: 13
Lymphs Abs: 0.9
Monocytes Absolute: 0.5
Monocytes Relative: 7
Neutro Abs: 5.8

## 2011-03-04 LAB — HEMOGLOBIN AND HEMATOCRIT, BLOOD: Hemoglobin: 9.8 — ABNORMAL LOW

## 2011-03-04 LAB — POCT PREGNANCY, URINE: Preg Test, Ur: NEGATIVE

## 2011-03-04 LAB — AMYLASE: Amylase: 129

## 2011-03-08 LAB — CULTURE, ROUTINE-ABSCESS

## 2011-03-08 LAB — LIPASE, BLOOD: Lipase: 21

## 2011-03-08 LAB — COMPREHENSIVE METABOLIC PANEL
ALT: 28
Alkaline Phosphatase: 50
CO2: 25
Calcium: 8.7
GFR calc non Af Amer: >60
Glucose, Bld: 87
Potassium: 3.6
Sodium: 134 — ABNORMAL LOW

## 2011-03-08 LAB — URINALYSIS, ROUTINE W REFLEX MICROSCOPIC
Ketones, ur: 15 — AB
Nitrite: NEGATIVE
Protein, ur: NEGATIVE

## 2011-03-08 LAB — URINE CULTURE

## 2011-03-08 LAB — HERPES SIMPLEX VIRUS CULTURE

## 2011-03-08 LAB — DIFFERENTIAL
Basophils Relative: 1
Eosinophils Absolute: 0.1
Neutrophils Relative %: 56

## 2011-03-08 LAB — CBC
HCT: 38.5
Hemoglobin: 12.6
MCHC: 32.7
RBC: 4.22

## 2011-03-08 LAB — POCT URINALYSIS DIP (DEVICE)
Glucose, UA: NEGATIVE
Hgb urine dipstick: NEGATIVE
Nitrite: NEGATIVE
Specific Gravity, Urine: >=1.03

## 2011-03-13 ENCOUNTER — Emergency Department (HOSPITAL_COMMUNITY)
Admission: EM | Admit: 2011-03-13 | Discharge: 2011-03-13 | Disposition: A | Discharge status: Home / Self Care | Payer: Medicaid Other | Attending: Emergency Medicine | Admitting: Emergency Medicine

## 2011-03-13 ENCOUNTER — Emergency Department (HOSPITAL_COMMUNITY): Payer: Medicaid Other

## 2011-03-13 DIAGNOSIS — F329 Major depressive disorder, single episode, unspecified: Secondary | ICD-10-CM | POA: Insufficient documentation

## 2011-03-13 DIAGNOSIS — Z79899 Other long term (current) drug therapy: Secondary | ICD-10-CM | POA: Insufficient documentation

## 2011-03-13 DIAGNOSIS — K589 Irritable bowel syndrome without diarrhea: Secondary | ICD-10-CM | POA: Insufficient documentation

## 2011-03-13 DIAGNOSIS — F909 Attention-deficit hyperactivity disorder, unspecified type: Secondary | ICD-10-CM | POA: Insufficient documentation

## 2011-03-13 DIAGNOSIS — K5900 Constipation, unspecified: Secondary | ICD-10-CM | POA: Insufficient documentation

## 2011-03-13 DIAGNOSIS — F3289 Other specified depressive episodes: Secondary | ICD-10-CM | POA: Insufficient documentation

## 2011-03-13 DIAGNOSIS — R109 Unspecified abdominal pain: Secondary | ICD-10-CM | POA: Insufficient documentation

## 2011-03-13 DIAGNOSIS — R110 Nausea: Secondary | ICD-10-CM | POA: Insufficient documentation

## 2011-03-13 LAB — DIFFERENTIAL
Eosinophils Absolute: 0.2 10*3/uL (ref 0.0–0.7)
Eosinophils Relative: 3 % (ref 0–5)
Lymphs Abs: 1.3 10*3/uL (ref 0.7–4.0)
Monocytes Absolute: 0.4 10*3/uL (ref 0.1–1.0)
Monocytes Relative: 6 % (ref 3–12)

## 2011-03-13 LAB — COMPREHENSIVE METABOLIC PANEL
AST: 47 U/L — ABNORMAL HIGH (ref 0–37)
Albumin: 2.9 g/dL — ABNORMAL LOW (ref 3.5–5.2)
BUN: 11 mg/dL (ref 6–23)
Calcium: 9.7 mg/dL (ref 8.4–10.5)
Creatinine, Ser: 0.49 mg/dL — ABNORMAL LOW (ref 0.50–1.10)

## 2011-03-13 LAB — CBC
HCT: 35.8 % — ABNORMAL LOW (ref 36.0–46.0)
MCH: 26.8 pg (ref 26.0–34.0)
MCHC: 31.6 g/dL (ref 30.0–36.0)
MCV: 85 fL (ref 78.0–100.0)
Platelets: 276 10*3/uL (ref 150–400)
RDW: 15.3 % (ref 11.5–15.5)

## 2011-03-13 LAB — URINALYSIS, ROUTINE W REFLEX MICROSCOPIC
Bilirubin Urine: NEGATIVE
Glucose, UA: NEGATIVE mg/dL
Hgb urine dipstick: NEGATIVE
Ketones, ur: NEGATIVE mg/dL
Leukocytes, UA: NEGATIVE
Protein, ur: NEGATIVE mg/dL

## 2011-03-13 LAB — LIPASE, BLOOD: Lipase: 31 U/L (ref 11–59)

## 2011-03-13 MED ORDER — IOHEXOL 300 MG/ML  SOLN
100.0000 mL | Freq: Once | INTRAMUSCULAR | Status: AC | PRN
  Administered 2011-03-13: 100 mL via INTRAVENOUS

## 2011-03-16 LAB — URINALYSIS, ROUTINE W REFLEX MICROSCOPIC
Nitrite: NEGATIVE
Specific Gravity, Urine: 1.024
Urobilinogen, UA: 0.2

## 2011-03-16 LAB — HEPATIC FUNCTION PANEL
ALT: 26
AST: 29
Albumin: 3.1 — ABNORMAL LOW
Alkaline Phosphatase: 61
Bilirubin, Direct: 0.1
Indirect Bilirubin: 0.3
Total Bilirubin: 0.4
Total Protein: 6

## 2011-03-16 LAB — BASIC METABOLIC PANEL
Calcium: 9
GFR calc Af Amer: >60
GFR calc non Af Amer: >60
Glucose, Bld: 89
Sodium: 132 — ABNORMAL LOW

## 2011-03-16 LAB — LIPASE, BLOOD: Lipase: 25

## 2011-03-16 LAB — CBC
Hemoglobin: 12.9
RBC: 4.39
RDW: 14.7 — ABNORMAL HIGH

## 2011-03-16 LAB — POCT PREGNANCY, URINE: Operator id: 29727

## 2011-03-16 LAB — DIFFERENTIAL
Basophils Absolute: 0
Lymphocytes Relative: 23
Monocytes Absolute: 0.3
Neutro Abs: 5.2
Neutrophils Relative %: 71

## 2011-03-21 LAB — DIFFERENTIAL
Basophils Absolute: 0
Basophils Relative: 1
Eosinophils Relative: 1
Lymphocytes Relative: 25
Lymphocytes Relative: 32
Lymphs Abs: 1.4
Monocytes Absolute: 0.3
Monocytes Absolute: 0.4
Monocytes Relative: 7
Monocytes Relative: 8
Neutro Abs: 2.6
Neutro Abs: 2.7
Neutrophils Relative %: 66

## 2011-03-21 LAB — I-STAT 8, (EC8 V) (CONVERTED LAB)
Acid-Base Excess: 1
BUN: 10
BUN: 11
BUN: 12
Bicarbonate: 27.5 — ABNORMAL HIGH
Chloride: 106
Glucose, Bld: 106 — ABNORMAL HIGH
Glucose, Bld: 94
HCT: 33 — ABNORMAL LOW
HCT: 37
Hemoglobin: 11.2 — ABNORMAL LOW
Hemoglobin: 12.6
Hemoglobin: 12.6
Operator id: 285841
Operator id: 294521
Potassium: 4
Potassium: 4.4
Sodium: 138
Sodium: 141
Sodium: 139
TCO2: 28
pCO2, Ven: 53.5 — ABNORMAL HIGH
pH, Ven: 7.38 — ABNORMAL HIGH

## 2011-03-21 LAB — CBC
HCT: 34.9 — ABNORMAL LOW
Hemoglobin: 10.7 — ABNORMAL LOW
Hemoglobin: 11.8 — ABNORMAL LOW
MCHC: 34.6
RBC: 3.54 — ABNORMAL LOW
RBC: 3.92
RDW: 14.6 — ABNORMAL HIGH
WBC: 4.6

## 2011-03-21 LAB — URINALYSIS, ROUTINE W REFLEX MICROSCOPIC
Glucose, UA: NEGATIVE
Hgb urine dipstick: NEGATIVE
Ketones, ur: 15 — AB
Protein, ur: NEGATIVE
Urobilinogen, UA: 0.2

## 2011-03-21 LAB — POCT I-STAT CREATININE
Creatinine, Ser: 0.8
Operator id: 272551

## 2011-06-09 ENCOUNTER — Ambulatory Visit
Admission: RE | Admit: 2011-06-09 | Discharge: 2011-06-09 | Disposition: A | Discharge status: Home / Self Care | Payer: Medicaid Other | Source: Ambulatory Visit | Attending: Physician Assistant | Admitting: Physician Assistant

## 2011-06-09 ENCOUNTER — Other Ambulatory Visit: Payer: Self-pay | Admitting: Physician Assistant

## 2011-06-09 DIAGNOSIS — R1110 Vomiting, unspecified: Secondary | ICD-10-CM

## 2011-06-09 DIAGNOSIS — R110 Nausea: Secondary | ICD-10-CM

## 2011-06-09 MED ORDER — IOHEXOL 300 MG/ML  SOLN
100.0000 mL | Freq: Once | INTRAMUSCULAR | Status: AC | PRN
  Administered 2011-06-09: 100 mL via INTRAVENOUS

## 2011-08-22 ENCOUNTER — Emergency Department (HOSPITAL_BASED_OUTPATIENT_CLINIC_OR_DEPARTMENT_OTHER)
Admission: EM | Admit: 2011-08-22 | Discharge: 2011-08-23 | Disposition: A | Discharge status: Home / Self Care | Payer: Medicaid Other | Attending: Emergency Medicine | Admitting: Emergency Medicine

## 2011-08-22 ENCOUNTER — Encounter (HOSPITAL_BASED_OUTPATIENT_CLINIC_OR_DEPARTMENT_OTHER): Payer: Self-pay | Admitting: *Deleted

## 2011-08-22 ENCOUNTER — Emergency Department (INDEPENDENT_AMBULATORY_CARE_PROVIDER_SITE_OTHER): Payer: Medicaid Other

## 2011-08-22 DIAGNOSIS — R609 Edema, unspecified: Secondary | ICD-10-CM | POA: Insufficient documentation

## 2011-08-22 DIAGNOSIS — R142 Eructation: Secondary | ICD-10-CM | POA: Insufficient documentation

## 2011-08-22 DIAGNOSIS — Z9089 Acquired absence of other organs: Secondary | ICD-10-CM

## 2011-08-22 DIAGNOSIS — R112 Nausea with vomiting, unspecified: Secondary | ICD-10-CM | POA: Insufficient documentation

## 2011-08-22 DIAGNOSIS — R109 Unspecified abdominal pain: Secondary | ICD-10-CM | POA: Insufficient documentation

## 2011-08-22 DIAGNOSIS — J45909 Unspecified asthma, uncomplicated: Secondary | ICD-10-CM | POA: Insufficient documentation

## 2011-08-22 DIAGNOSIS — K5900 Constipation, unspecified: Secondary | ICD-10-CM | POA: Insufficient documentation

## 2011-08-22 DIAGNOSIS — Z79899 Other long term (current) drug therapy: Secondary | ICD-10-CM | POA: Insufficient documentation

## 2011-08-22 DIAGNOSIS — Z9884 Bariatric surgery status: Secondary | ICD-10-CM

## 2011-08-22 DIAGNOSIS — K589 Irritable bowel syndrome without diarrhea: Secondary | ICD-10-CM | POA: Insufficient documentation

## 2011-08-22 DIAGNOSIS — R141 Gas pain: Secondary | ICD-10-CM | POA: Insufficient documentation

## 2011-08-22 HISTORY — DX: Reserved for concepts with insufficient information to code with codable children: IMO0002

## 2011-08-22 HISTORY — DX: Irritable bowel syndrome, unspecified: K58.9

## 2011-08-22 LAB — URINALYSIS, ROUTINE W REFLEX MICROSCOPIC
Glucose, UA: NEGATIVE mg/dL
Leukocytes, UA: NEGATIVE
Nitrite: NEGATIVE
pH: 5.5 (ref 5.0–8.0)

## 2011-08-22 LAB — COMPREHENSIVE METABOLIC PANEL
ALT: 22 U/L (ref 0–35)
AST: 26 U/L (ref 0–37)
Alkaline Phosphatase: 64 U/L (ref 39–117)
Calcium: 9.6 mg/dL (ref 8.4–10.5)
GFR calc Af Amer: >90 mL/min (ref >90)
Glucose, Bld: 100 mg/dL — ABNORMAL HIGH (ref 70–99)
Potassium: 4.7 mEq/L (ref 3.5–5.1)
Sodium: 138 mEq/L (ref 135–145)
Total Protein: 6.7 g/dL (ref 6.0–8.3)

## 2011-08-22 LAB — PREGNANCY, URINE: Preg Test, Ur: NEGATIVE

## 2011-08-22 LAB — CBC
Hemoglobin: 10.9 g/dL — ABNORMAL LOW (ref 12.0–15.0)
MCHC: 32.1 g/dL (ref 30.0–36.0)
Platelets: 329 10*3/uL (ref 150–400)
RBC: 4.33 MIL/uL (ref 3.87–5.11)

## 2011-08-22 MED ORDER — HYDROMORPHONE HCL PF 1 MG/ML IJ SOLN
1.0000 mg | Freq: Once | INTRAMUSCULAR | Status: AC
  Administered 2011-08-23: 1 mg via INTRAVENOUS
  Filled 2011-08-22: qty 1

## 2011-08-22 MED ORDER — ONDANSETRON HCL 4 MG/2ML IJ SOLN
4.0000 mg | Freq: Once | INTRAMUSCULAR | Status: AC
  Administered 2011-08-22: 4 mg via INTRAVENOUS
  Filled 2011-08-22: qty 2

## 2011-08-22 MED ORDER — SODIUM CHLORIDE 0.9 % IV BOLUS (SEPSIS)
1000.0000 mL | Freq: Once | INTRAVENOUS | Status: AC
  Administered 2011-08-22: 1000 mL via INTRAVENOUS

## 2011-08-22 MED ORDER — HYDROMORPHONE HCL PF 1 MG/ML IJ SOLN
1.0000 mg | Freq: Once | INTRAMUSCULAR | Status: AC
  Administered 2011-08-22: 1 mg via INTRAVENOUS
  Filled 2011-08-22: qty 1

## 2011-08-22 MED ORDER — IOHEXOL 300 MG/ML  SOLN: 20.0000 mL | INTRAMUSCULAR | Status: DC

## 2011-08-22 MED ORDER — IOHEXOL 300 MG/ML  SOLN
100.0000 mL | Freq: Once | INTRAMUSCULAR | Status: AC | PRN
  Administered 2011-08-22: 100 mL via INTRAVENOUS

## 2011-08-22 MED ORDER — PROMETHAZINE HCL 25 MG/ML IJ SOLN
25.0000 mg | Freq: Once | INTRAMUSCULAR | Status: AC
  Administered 2011-08-23: 25 mg via INTRAMUSCULAR
  Filled 2011-08-22: qty 1

## 2011-08-22 NOTE — ED Notes (Signed)
Abdominal pain.  Constipation. Took 7 laxatives yesterday with only a small bowel movement. Large amount of abdominal distention noted.

## 2011-08-22 NOTE — ED Provider Notes (Signed)
History    This chart was scribed for Peggy Chick, MD, MD by Smitty Pluck. The patient was seen in room MH11 and the patient's care was started at 9:22PM.   CSN: [id number]  Arrival date & time 08/22/11  1742   First MD Initiated Contact with Patient 08/22/11 2040      Chief Complaint  Patient presents with  . Abdominal Pain    (Consider location/radiation/quality/duration/timing/severity/associated sxs/prior treatment) Patient is a 41 y.o. female presenting with abdominal pain. The history is provided by the patient.  Abdominal Pain The primary symptoms of the illness include abdominal pain.   Peggy Austin is a 41 y.o. female who presents to the Emergency Department complaining of moderate abdominal pain onset 1 week ago. She describes the pain as sharp. She has nausea and vomiting 2x onset 1 day ago. She reports being constipated for 5 days. She has taken 7 laxatives 1 day ago. She had a small bowel movement today. Pt has hx of IBS, hernia (multiple) and ulcer. She has gallbladder removed 3 years and bypass 7 years ago. The symptoms have been constant since onset without radiation. She reports having increased appetite.  GI specialist Dr. Kendra Opitz PCP Dr. Alfonso Patten at [facility] on [address]   Past Medical History  Diagnosis Date  . Asthma   . Hernia   . IBS (irritable bowel syndrome)   . Ulcer     Past Surgical History  Procedure Date  . Gastric bypass   . Cholecystectomy     No family history on file.  History  Substance Use Topics  . Smoking status: Never Smoker   . Smokeless tobacco: Not on file  . Alcohol Use: No    OB History    Grav Para Term Preterm Abortions TAB SAB Ect Mult Living                  Review of Systems  Gastrointestinal: Positive for abdominal pain.  All other systems reviewed and are negative.   10 Systems reviewed and are negative for acute change except as noted in the HPI.  Allergies  Morphine and Moxifloxacin  Home  Medications   Current Outpatient Rx  Name Route Sig Dispense Refill  . ALPRAZOLAM 1 MG PO TABS Oral Take 1 mg by mouth at bedtime as needed. Patient took one half of this medication today.    Marland Kitchen DICYCLOMINE HCL 20 MG PO TABS Oral Take 1 tablet (20 mg total) by mouth 2 (two) times daily. 20 tablet 0  . DIPHENOXYLATE-ATROPINE 2.5-0.025 MG PO TABS Oral Take 1 tablet by mouth 4 (four) times daily as needed.      . MULTIVITAMINS PO TABS Oral Take 1 tablet by mouth daily.      Marland Kitchen PROMETHAZINE HCL 25 MG PO TABS Oral Take 25 mg by mouth every 6 (six) hours as needed.      Marland Kitchen ONDANSETRON HCL 4 MG PO TABS Oral Take 4 mg by mouth every 8 (eight) hours as needed.      . OXYCODONE-ACETAMINOPHEN 5-325 MG PO TABS Oral Take 1-2 tablets by mouth every 6 (six) hours as needed for pain. 15 tablet 0  . PROMETHAZINE HCL 25 MG PO TABS Oral Take 1 tablet (25 mg total) by mouth every 6 (six) hours as needed for nausea. 10 tablet 0    BP 110/72  Pulse 68  Temp(Src) 97.8 F (36.6 C) (Oral)  Resp 20  Wt 130 lb (58.968 kg)  SpO2  100% Vitals reviewed Physical Exam  Nursing note and vitals reviewed. Constitutional: She is oriented to person, place, and time. She appears well-developed and well-nourished. No distress.  HENT:  Head: Normocephalic and atraumatic.  Mouth/Throat: Oropharynx is clear and moist.  Eyes: Conjunctivae are normal. Pupils are equal, round, and reactive to light.  Cardiovascular: Normal rate, regular rhythm and normal heart sounds.   Pulmonary/Chest: Effort normal and breath sounds normal. No respiratory distress.  Abdominal: Bowel sounds are normal. She exhibits distension (mild). There is tenderness (RUQ LUQ). There is guarding. There is no rebound.  Musculoskeletal: She exhibits edema.  Neurological: She is alert and oriented to person, place, and time.  Skin: Skin is warm and dry.  Psychiatric: She has a normal mood and affect. Her behavior is normal.  Note- mild voluntary gaurding of  abdomen, no rebound  ED Course  Procedures (including critical care time) DIAGNOSTIC STUDIES: Oxygen Saturation is 100% on room air, normal by my interpretation.    COORDINATION OF CARE: 9:30PM EDP ordered medication: Zofran 4 mg, dilaudid 1 mg, NaCl 0.9% bolus   Labs Reviewed  CBC - Abnormal; Notable for the following:    Hemoglobin 10.9 (*)    HCT 34.0 (*)    MCH 25.2 (*)    RDW 16.4 (*)    All other components within normal limits  COMPREHENSIVE METABOLIC PANEL - Abnormal; Notable for the following:    Glucose, Bld 100 (*)    Albumin 3.4 (*)    Total Bilirubin 0.1 (*)    All other components within normal limits  URINALYSIS, ROUTINE W REFLEX MICROSCOPIC  PREGNANCY, URINE  LIPASE, BLOOD   Ct Abdomen Pelvis W Contrast  08/22/2011  *RADIOLOGY REPORT*  Clinical Data: Abdominal pain.  Prior gastric bypass.  CT ABDOMEN AND PELVIS WITH CONTRAST  Technique:  Multidetector CT imaging of the abdomen and pelvis was performed following the standard protocol during bolus administration of intravenous contrast.  Contrast: OMNIPAQUE IOHEXOL 300 MG/ML IJ SOLN  Comparison: 07/06/2011 bowel  Findings:  Visualized lung bases are clear.  Stable low density structure within the posterior right hepatic lobe measuring 7.2 mm, image 37.  There are several low density structures within the left hepatic lobe which are also stable from prior exam and likely represents cyst.  Prior cholecystectomy.  There is abnormal dilatation of the common bile duct which measures 1.9 cm in maximum diameter.  Previously 1.4 cm.  No definite stone or mass noted.  The pancreas appears normal and the pancreatic duct has a normal caliber.  Spleen is normal.  The adrenal glands are normal.  The kidneys are normal.  Postsurgical change compatible with gastric bypass surgery identified.  The small bowel loops have a normal caliber without evidence for obstruction.  The appendix is identified and appears normal.  Moderate  retained stool identified throughout the colon up to the level of the rectum.Abdominal aorta normal is in caliber.  No significant lymphadenopathy.  No free fluid or abnormal fluid collections.  No free fluid or abnormal fluid collections.  No significant lymphadenopathy.  Urinary bladder is normal.  Review of the visualized osseous structures shows degenerative disc disease at the L5-S1 level.  IMPRESSION:  1.  Continued increased caliber of the common bile duct status post cholecystectomy.  Although no stone or masses identified this could be better assessed with MRCP. 2.  The appendix is visualized and appears normal. 3.  Postsurgical change compatible with gastric bypass surgery.  Original Report Authenticated By:  Rosealee Albee, M.D.     1. Abdominal pain       MDM  Patient presents with an exacerbation of her chronic abdominal pain. She has a history of gastric bypass surgery and states that she constantly has pain in the area in her right side of her abdomen that is hurting tonight however the pain is worse tonight than usual. CT scan obtained this evening shows no acute abnormality including no obstruction however are postsurgical changes consistent with her prior surgeries are evident. Her labs are also reassuring. Patient was treated with pain medication and anti-emetics.  Pt discharged with strict return precautions, she was given pain meds and anti-emetics, pt agreeable with this plan and will arrange for follow up with her PMD   I personally performed the services described in this documentation, which was scribed in my presence. The recorded information has been reviewed and considered.         Peggy Chick, MD 08/23/11 [phone]

## 2011-08-22 NOTE — ED Notes (Signed)
Pt. Reports she still has pain and nausea.

## 2011-08-22 NOTE — Discharge Instructions (Signed)
Return to the ED with any concerns including worsening pain, fever, vomiting and not able to keep down with or your medications, decreased level of alertness or lethargy, or any other alarming symptoms.

## 2011-08-22 NOTE — ED Notes (Signed)
Pt. Reports she ate 1/2 a sandwich and some broth with a couple of soft drinks.  Pt. Continues to speak about a big appetite.

## 2011-08-23 MED ORDER — PROMETHAZINE HCL 25 MG PO TABS: 25.0000 mg | ORAL_TABLET | Freq: Four times a day (QID) | ORAL | Status: DC | PRN

## 2011-08-23 MED ORDER — OXYCODONE-ACETAMINOPHEN 5-325 MG PO TABS: 1.0000 | ORAL_TABLET | Freq: Four times a day (QID) | ORAL | Status: AC | PRN

## 2011-08-23 NOTE — ED Notes (Signed)
Pt. Walking around unit and asking for more crackers and for Dr. Karma Ganja to come in and see her.

## 2011-08-25 ENCOUNTER — Emergency Department (HOSPITAL_BASED_OUTPATIENT_CLINIC_OR_DEPARTMENT_OTHER)
Admission: EM | Admit: 2011-08-25 | Discharge: 2011-08-25 | Disposition: A | Discharge status: Home / Self Care | Payer: Medicaid Other | Attending: Emergency Medicine | Admitting: Emergency Medicine

## 2011-08-25 ENCOUNTER — Encounter (HOSPITAL_BASED_OUTPATIENT_CLINIC_OR_DEPARTMENT_OTHER): Payer: Self-pay | Admitting: Family Medicine

## 2011-08-25 ENCOUNTER — Other Ambulatory Visit: Payer: Self-pay

## 2011-08-25 ENCOUNTER — Emergency Department (INDEPENDENT_AMBULATORY_CARE_PROVIDER_SITE_OTHER): Payer: Medicaid Other

## 2011-08-25 DIAGNOSIS — R1013 Epigastric pain: Secondary | ICD-10-CM | POA: Insufficient documentation

## 2011-08-25 DIAGNOSIS — K589 Irritable bowel syndrome without diarrhea: Secondary | ICD-10-CM | POA: Insufficient documentation

## 2011-08-25 DIAGNOSIS — R079 Chest pain, unspecified: Secondary | ICD-10-CM | POA: Insufficient documentation

## 2011-08-25 DIAGNOSIS — Z79899 Other long term (current) drug therapy: Secondary | ICD-10-CM | POA: Insufficient documentation

## 2011-08-25 LAB — COMPREHENSIVE METABOLIC PANEL
Albumin: 3.1 g/dL — ABNORMAL LOW (ref 3.5–5.2)
Alkaline Phosphatase: 59 U/L (ref 39–117)
BUN: 11 mg/dL (ref 6–23)
Calcium: 9.4 mg/dL (ref 8.4–10.5)
Creatinine, Ser: 0.6 mg/dL (ref 0.50–1.10)
GFR calc Af Amer: >90 mL/min (ref >90)
Glucose, Bld: 97 mg/dL (ref 70–99)
Total Protein: 6.1 g/dL (ref 6.0–8.3)

## 2011-08-25 LAB — LIPASE, BLOOD: Lipase: 32 U/L (ref 11–59)

## 2011-08-25 LAB — DIFFERENTIAL
Basophils Relative: 1 % (ref 0–1)
Eosinophils Absolute: 0.3 10*3/uL (ref 0.0–0.7)
Eosinophils Relative: 4 % (ref 0–5)
Lymphs Abs: 1.6 10*3/uL (ref 0.7–4.0)
Monocytes Absolute: 0.6 10*3/uL (ref 0.1–1.0)
Monocytes Relative: 8 % (ref 3–12)
Neutrophils Relative %: 62 % (ref 43–77)

## 2011-08-25 LAB — CBC
HCT: 31.8 % — ABNORMAL LOW (ref 36.0–46.0)
Hemoglobin: 10.1 g/dL — ABNORMAL LOW (ref 12.0–15.0)
MCH: 24.8 pg — ABNORMAL LOW (ref 26.0–34.0)
MCHC: 31.8 g/dL (ref 30.0–36.0)
MCV: 78.1 fL (ref 78.0–100.0)
RBC: 4.07 MIL/uL (ref 3.87–5.11)

## 2011-08-25 LAB — PREGNANCY, URINE: Preg Test, Ur: NEGATIVE

## 2011-08-25 MED ORDER — METOCLOPRAMIDE HCL 5 MG/ML IJ SOLN
10.0000 mg | Freq: Once | INTRAMUSCULAR | Status: AC
  Administered 2011-08-25: 10 mg via INTRAVENOUS
  Filled 2011-08-25 (×2): qty 2

## 2011-08-25 MED ORDER — ONDANSETRON HCL 4 MG/2ML IJ SOLN
4.0000 mg | Freq: Once | INTRAMUSCULAR | Status: AC
  Administered 2011-08-25: 4 mg via INTRAVENOUS
  Filled 2011-08-25: qty 2

## 2011-08-25 MED ORDER — HYDROMORPHONE HCL PF 1 MG/ML IJ SOLN
1.0000 mg | Freq: Once | INTRAMUSCULAR | Status: AC
  Administered 2011-08-25: 1 mg via INTRAVENOUS
  Filled 2011-08-25: qty 1

## 2011-08-25 MED ORDER — HYDROCODONE-ACETAMINOPHEN 5-325 MG PO TABS: 2.0000 | ORAL_TABLET | ORAL | Status: AC | PRN

## 2011-08-25 NOTE — ED Notes (Signed)
EKG given to EDP Jacubowitz to review- pt left in 13 for EDP to assess per his request

## 2011-08-25 NOTE — ED Notes (Signed)
Pt c/o epigastric pain radiating through to back similar to pain when pt was seen on Monday. Pt sts she feels dizzy and nauseous.

## 2011-08-25 NOTE — Discharge Instructions (Signed)
Use your Phenergan as needed for nausea. Stop Percocet Take the hydrocodone instead as needed for pain Call Dr. Elnoria Howard tomorrow to schedule next available office appointment Return if her condition worsens for any reason

## 2011-08-25 NOTE — ED Notes (Signed)
Paged Dr. Elnoria Howard for Dr. Ethelda Chick --call back on [phone]

## 2011-08-25 NOTE — ED Provider Notes (Addendum)
History     CSN: [id number]  Arrival date & time 08/25/11  1513   None     No chief complaint on file.   (Consider location/radiation/quality/duration/timing/severity/associated sxs/prior treatment) HPI Complains of diffuse abdominal pain and swelling onset approximately 10 days ago pain is constant becoming progressively worse patient vomited one time yesterday and one time today had a slight bowel movement yesterday seen here on 08/22/2011 prescribed Percocet which she's been taking without relief pain is constant moderate to severe progressively worsening decreased appetite now radiates to entire back. Presently also complains of nausea, she treats her nausea with Phenergan of which she has a one-year supply with out relief of nausea Not made better or worse by anything. No chest pain no other associated symptoms, Past Medical History  Diagnosis Date  . Asthma   . Hernia   . IBS (irritable bowel syndrome)   . Ulcer     Past Surgical History  Procedure Date  . Gastric bypass   . Cholecystectomy     No family history on file.  History  Substance Use Topics  . Smoking status: Never Smoker   . Smokeless tobacco: Not on file  . Alcohol Use: No    OB History    Grav Para Term Preterm Abortions TAB SAB Ect Mult Living                  Review of Systems  Constitutional: Negative.   HENT: Negative.   Respiratory: Negative.   Cardiovascular: Negative.  Negative for chest pain.  Gastrointestinal: Positive for nausea, vomiting, constipation and abdominal distention.  Musculoskeletal: Negative.   Skin: Negative.   Neurological: Negative.   Hematological: Negative.   Psychiatric/Behavioral: Negative.   All other systems reviewed and are negative.    Allergies  Morphine and Moxifloxacin  Home Medications   Current Outpatient Rx  Name Route Sig Dispense Refill  . ALPRAZOLAM 1 MG PO TABS Oral Take 1 mg by mouth at bedtime as needed. Patient took one half of this  medication today.    Marland Kitchen DICYCLOMINE HCL 20 MG PO TABS Oral Take 1 tablet (20 mg total) by mouth 2 (two) times daily. 20 tablet 0  . DIPHENOXYLATE-ATROPINE 2.5-0.025 MG PO TABS Oral Take 1 tablet by mouth 4 (four) times daily as needed.      . MULTIVITAMINS PO TABS Oral Take 1 tablet by mouth daily.      Marland Kitchen ONDANSETRON HCL 4 MG PO TABS Oral Take 4 mg by mouth every 8 (eight) hours as needed.      . OXYCODONE-ACETAMINOPHEN 5-325 MG PO TABS Oral Take 1-2 tablets by mouth every 6 (six) hours as needed for pain. 15 tablet 0  . PROMETHAZINE HCL 25 MG PO TABS Oral Take 25 mg by mouth every 6 (six) hours as needed.      Marland Kitchen PROMETHAZINE HCL 25 MG PO TABS Oral Take 1 tablet (25 mg total) by mouth every 6 (six) hours as needed for nausea. 10 tablet 0    There were no vitals taken for this visit.  Physical Exam  Constitutional: She appears well-developed and well-nourished. She appears distressed.       Appears uncomfortable  HENT:  Head: Normocephalic and atraumatic.  Eyes: Conjunctivae are normal. Pupils are equal, round, and reactive to light.  Neck: Neck supple. No tracheal deviation present. No thyromegaly present.  Cardiovascular: Normal rate and regular rhythm.   No murmur heard. Pulmonary/Chest: Effort normal and breath sounds normal.  Abdominal:  Soft. Bowel sounds are normal. She exhibits no distension. There is no tenderness.       Diffusely tender  Musculoskeletal: Normal range of motion. She exhibits no edema and no tenderness.  Neurological: She is alert. Coordination normal.  Skin: Skin is warm and dry. No rash noted.  Psychiatric: She has a normal mood and affect.    Date: 08/25/2011  Rate: 90  Rhythm: normal sinus rhythm  QRS Axis: normal  Intervals: normal  ST/T Wave abnormalities: nonspecific T wave changes  Conduction Disutrbances:none  Narrative Interpretation:   Old EKG Reviewed: unchanged Unchanged from 11/14/2009  ED Course  Procedures (including critical care  time) Feels much improved after treatment with  hydromorphone and Reglan IV. Patient is able to eat crackers and drink Coke Labs Reviewed - No data to display No results found.   No diagnosis found.  Results for orders placed during the hospital encounter of 08/25/11  CBC      Component Value Range   WBC 6.7  4.0 - 10.5 (K/uL)   RBC 4.07  3.87 - 5.11 (MIL/uL)   Hemoglobin 10.1 (*) 12.0 - 15.0 (g/dL)   HCT 16.1 (*) 09.6 - 46.0 (%)   MCV 78.1  78.0 - 100.0 (fL)   MCH 24.8 (*) 26.0 - 34.0 (pg)   MCHC 31.8  30.0 - 36.0 (g/dL)   RDW 04.5 (*) 40.9 - 15.5 (%)   Platelets 317  150 - 400 (K/uL)  DIFFERENTIAL      Component Value Range   Neutrophils Relative 62  43 - 77 (%)   Neutro Abs 4.1  1.7 - 7.7 (K/uL)   Lymphocytes Relative 25  12 - 46 (%)   Lymphs Abs 1.6  0.7 - 4.0 (K/uL)   Monocytes Relative 8  3 - 12 (%)   Monocytes Absolute 0.6  0.1 - 1.0 (K/uL)   Eosinophils Relative 4  0 - 5 (%)   Eosinophils Absolute 0.3  0.0 - 0.7 (K/uL)   Basophils Relative 1  0 - 1 (%)   Basophils Absolute 0.0  0.0 - 0.1 (K/uL)  COMPREHENSIVE METABOLIC PANEL      Component Value Range   Sodium 137  135 - 145 (mEq/L)   Potassium 4.6  3.5 - 5.1 (mEq/L)   Chloride 101  96 - 112 (mEq/L)   CO2 29  19 - 32 (mEq/L)   Glucose, Bld 97  70 - 99 (mg/dL)   BUN 11  6 - 23 (mg/dL)   Creatinine, Ser 8.11  0.50 - 1.10 (mg/dL)   Calcium 9.4  8.4 - 91.4 (mg/dL)   Total Protein 6.1  6.0 - 8.3 (g/dL)   Albumin 3.1 (*) 3.5 - 5.2 (g/dL)   AST 23  0 - 37 (U/L)   ALT 20  0 - 35 (U/L)   Alkaline Phosphatase 59  39 - 117 (U/L)   Total Bilirubin 0.1 (*) 0.3 - 1.2 (mg/dL)   GFR calc non Af Amer >90  >90 (mL/min)   GFR calc Af Amer >90  >90 (mL/min)  LIPASE, BLOOD      Component Value Range   Lipase 32  11 - 59 (U/L)  PREGNANCY, URINE      Component Value Range   Preg Test, Ur NEGATIVE  NEGATIVE    Ct Abdomen Pelvis W Contrast  08/22/2011  *RADIOLOGY REPORT*  Clinical Data: Abdominal pain.  Prior gastric bypass.   CT ABDOMEN AND PELVIS WITH CONTRAST  Technique:  Multidetector CT imaging of  the abdomen and pelvis was performed following the standard protocol during bolus administration of intravenous contrast.  Contrast: OMNIPAQUE IOHEXOL 300 MG/ML IJ SOLN  Comparison: 07/06/2011 bowel  Findings:  Visualized lung bases are clear.  Stable low density structure within the posterior right hepatic lobe measuring 7.2 mm, image 37.  There are several low density structures within the left hepatic lobe which are also stable from prior exam and likely represents cyst.  Prior cholecystectomy.  There is abnormal dilatation of the common bile duct which measures 1.9 cm in maximum diameter.  Previously 1.4 cm.  No definite stone or mass noted.  The pancreas appears normal and the pancreatic duct has a normal caliber.  Spleen is normal.  The adrenal glands are normal.  The kidneys are normal.  Postsurgical change compatible with gastric bypass surgery identified.  The small bowel loops have a normal caliber without evidence for obstruction.  The appendix is identified and appears normal.  Moderate retained stool identified throughout the colon up to the level of the rectum.Abdominal aorta normal is in caliber.  No significant lymphadenopathy.  No free fluid or abnormal fluid collections.  No free fluid or abnormal fluid collections.  No significant lymphadenopathy.  Urinary bladder is normal.  Review of the visualized osseous structures shows degenerative disc disease at the L5-S1 level.  IMPRESSION:  1.  Continued increased caliber of the common bile duct status post cholecystectomy.  Although no stone or masses identified this could be better assessed with MRCP. 2.  The appendix is visualized and appears normal. 3.  Postsurgical change compatible with gastric bypass surgery.  Original Report Authenticated By: Rosealee Albee, M.D.   Dg Abd Acute W/chest  08/25/2011  *RADIOLOGY REPORT*  Clinical Data: Epigastric abdominal pains.   ACUTE ABDOMEN SERIES (ABDOMEN 2 VIEW & CHEST 1 VIEW)  Comparison: No priors.  Findings: Lung volumes are normal.  No consolidative airspace disease.  No pleural effusions.  No pneumothorax.  No pulmonary nodule or mass noted.  Pulmonary vasculature and the cardiomediastinal silhouette are within normal limits.  Supine and upright views of the abdomen demonstrate gas and stool scattered throughout the colon extending to the distal rectum. There is a small amount of retained oral contrast material within the lumen of the appendix.  No pathologic distension of small bowel is noted. No pneumoperitoneum.  Surgical clips are noted in the right upper quadrant of the abdomen consistent with prior cholecystectomy.  Additional surgical clips are noted near the gastroesophageal junction.  IMPRESSION: 1.  Nonobstructive bowel gas pattern.  No pneumoperitoneum. 2.  Postsurgical changes, as above. 3.  No radiographic evidence of acute cardiopulmonary disease.  Original Report Authenticated By: Florencia Reasons, M.D.     MDM  Patient reports me that she's not done well in the past with Percocet, pain is handled better with hydrocodone. I spoke with Dr.Hung and specifically discussed CT scan report from 08/22/11 Plan prescription Norco, patient has Phenergan at home which she states she will take as needed for nausea. His declining prescription for other antiemetic Told to stop Percocet Call Dr. Elnoria Howard tomorrow for followup and further outpatient testing Diagnosis#1 abdominal pain #2 anemia       Doug Sou, MD 08/25/11 Serena Croissant  Doug Sou, MD 08/25/11 7829  Doug Sou, MD 08/25/11 5621

## 2011-08-26 ENCOUNTER — Other Ambulatory Visit: Payer: Self-pay | Admitting: Gastroenterology

## 2011-08-26 DIAGNOSIS — R1011 Right upper quadrant pain: Secondary | ICD-10-CM

## 2011-08-26 DIAGNOSIS — K838 Other specified diseases of biliary tract: Secondary | ICD-10-CM

## 2011-09-29 ENCOUNTER — Emergency Department (HOSPITAL_BASED_OUTPATIENT_CLINIC_OR_DEPARTMENT_OTHER)
Admission: EM | Admit: 2011-09-29 | Discharge: 2011-09-29 | Disposition: A | Discharge status: Home / Self Care | Payer: Medicaid Other | Attending: Emergency Medicine | Admitting: Emergency Medicine

## 2011-09-29 ENCOUNTER — Encounter (HOSPITAL_BASED_OUTPATIENT_CLINIC_OR_DEPARTMENT_OTHER): Payer: Self-pay | Admitting: *Deleted

## 2011-09-29 DIAGNOSIS — R10814 Left lower quadrant abdominal tenderness: Secondary | ICD-10-CM | POA: Insufficient documentation

## 2011-09-29 DIAGNOSIS — R109 Unspecified abdominal pain: Secondary | ICD-10-CM | POA: Insufficient documentation

## 2011-09-29 DIAGNOSIS — R5383 Other fatigue: Secondary | ICD-10-CM | POA: Insufficient documentation

## 2011-09-29 DIAGNOSIS — N390 Urinary tract infection, site not specified: Secondary | ICD-10-CM | POA: Insufficient documentation

## 2011-09-29 DIAGNOSIS — R1110 Vomiting, unspecified: Secondary | ICD-10-CM | POA: Insufficient documentation

## 2011-09-29 DIAGNOSIS — R5381 Other malaise: Secondary | ICD-10-CM | POA: Insufficient documentation

## 2011-09-29 DIAGNOSIS — R6883 Chills (without fever): Secondary | ICD-10-CM | POA: Insufficient documentation

## 2011-09-29 DIAGNOSIS — R42 Dizziness and giddiness: Secondary | ICD-10-CM | POA: Insufficient documentation

## 2011-09-29 LAB — BASIC METABOLIC PANEL
Chloride: 104 mEq/L (ref 96–112)
GFR calc Af Amer: >90 mL/min (ref >90)
GFR calc non Af Amer: >90 mL/min (ref >90)
Potassium: 4.3 mEq/L (ref 3.5–5.1)
Sodium: 139 mEq/L (ref 135–145)

## 2011-09-29 LAB — URINE MICROSCOPIC-ADD ON

## 2011-09-29 LAB — URINALYSIS, ROUTINE W REFLEX MICROSCOPIC
Glucose, UA: 500 mg/dL — AB
Ketones, ur: NEGATIVE mg/dL
Leukocytes, UA: NEGATIVE
Nitrite: NEGATIVE
Protein, ur: NEGATIVE mg/dL
pH: 5.5 (ref 5.0–8.0)

## 2011-09-29 LAB — PREGNANCY, URINE: Preg Test, Ur: NEGATIVE

## 2011-09-29 LAB — DIFFERENTIAL
Basophils Relative: 0 % (ref 0–1)
Eosinophils Absolute: 0 10*3/uL (ref 0.0–0.7)
Neutro Abs: 6.5 10*3/uL (ref 1.7–7.7)
Neutrophils Relative %: 81 % — ABNORMAL HIGH (ref 43–77)

## 2011-09-29 LAB — CBC
MCH: 24.5 pg — ABNORMAL LOW (ref 26.0–34.0)
MCHC: 31.6 g/dL (ref 30.0–36.0)
Platelets: 375 10*3/uL (ref 150–400)
RBC: 4.33 MIL/uL (ref 3.87–5.11)

## 2011-09-29 MED ORDER — METOCLOPRAMIDE HCL 5 MG/ML IJ SOLN
10.0000 mg | Freq: Once | INTRAMUSCULAR | Status: AC
  Administered 2011-09-29: 10 mg via INTRAVENOUS
  Filled 2011-09-29: qty 2

## 2011-09-29 MED ORDER — CEPHALEXIN 500 MG PO CAPS: 500.0000 mg | ORAL_CAPSULE | Freq: Four times a day (QID) | ORAL | Status: DC

## 2011-09-29 MED ORDER — ONDANSETRON HCL 4 MG/2ML IJ SOLN
4.0000 mg | Freq: Once | INTRAMUSCULAR | Status: AC
  Administered 2011-09-29: 4 mg via INTRAVENOUS
  Filled 2011-09-29: qty 2

## 2011-09-29 MED ORDER — HYDROMORPHONE HCL PF 1 MG/ML IJ SOLN
1.0000 mg | Freq: Once | INTRAMUSCULAR | Status: AC
  Administered 2011-09-29: 1 mg via INTRAVENOUS
  Filled 2011-09-29: qty 1

## 2011-09-29 MED ORDER — HYDROCODONE-ACETAMINOPHEN 5-500 MG PO TABS: 1.0000 | ORAL_TABLET | Freq: Four times a day (QID) | ORAL | Status: AC | PRN

## 2011-09-29 MED ORDER — CEPHALEXIN 500 MG PO CAPS: 500.0000 mg | ORAL_CAPSULE | Freq: Four times a day (QID) | ORAL | Status: AC

## 2011-09-29 NOTE — ED Notes (Signed)
Made MD aware of Pt. Wants for reglan and more pain meds.

## 2011-09-29 NOTE — ED Notes (Signed)
rec'd call from [facility] pharmacy questioning rx for hydrocodone written today since pt had rec'd 90 pills on 4-15 and also rec'd of phenergan with codeine on 4-22. Spoke with Lemont Fillers, PA who states NOT to fill rx that pt was given today for hydrocodone. Pharmacy made aware.

## 2011-09-29 NOTE — ED Provider Notes (Signed)
History     CSN: [id number]  Arrival date & time 09/29/11  1713   First MD Initiated Contact with Patient 09/29/11 1715      No chief complaint on file.   (Consider location/radiation/quality/duration/timing/severity/associated sxs/prior treatment) Patient is a 41 y.o. female presenting with abdominal pain. The history is provided by the patient.  Abdominal Pain The primary symptoms of the illness include abdominal pain, nausea and vomiting. The primary symptoms of the illness do not include fever, diarrhea, dysuria, vaginal discharge or vaginal bleeding. The current episode started yesterday. The onset of the illness was gradual. The problem has been gradually worsening.  Additional symptoms associated with the illness include chills. Symptoms associated with the illness do not include constipation, urgency, hematuria or back pain.  Pt states for the last 24 hrs she has had crying spells, hopelessness, anxiety, and decreased urine. States hx of the same when had UTIs. Today, about an hour prior to arrival, she developed left lower abdominal pain. States currently being treated for bronchitis with zpack, doxycyclin. Taking phenergan, reglan, lomotil, bentyl for chronic abdominal issues. States nausea, vomiting for the same time. Denies pain with urination or urgency. Denies changes in bowels.   Past Medical History  Diagnosis Date  . Asthma   . Hernia   . IBS (irritable bowel syndrome)   . Ulcer     Past Surgical History  Procedure Date  . Gastric bypass   . Cholecystectomy     No family history on file.  History  Substance Use Topics  . Smoking status: Never Smoker   . Smokeless tobacco: Not on file  . Alcohol Use: No    OB History    Grav Para Term Preterm Abortions TAB SAB Ect Mult Living                  Review of Systems  Constitutional: Positive for chills. Negative for fever.  Respiratory: Negative.   Cardiovascular: Negative.   Gastrointestinal: Positive  for nausea, vomiting and abdominal pain. Negative for diarrhea and constipation.  Genitourinary: Positive for decreased urine volume and difficulty urinating. Negative for dysuria, urgency, hematuria, flank pain, vaginal bleeding and vaginal discharge.  Musculoskeletal: Negative for back pain.  Skin: Negative.   Neurological: Positive for dizziness and weakness.  Psychiatric/Behavioral: Negative.     Allergies  Morphine and Moxifloxacin  Home Medications   Current Outpatient Rx  Name Route Sig Dispense Refill  . DICYCLOMINE HCL 20 MG PO TABS Oral Take 1 tablet (20 mg total) by mouth 2 (two) times daily. 20 tablet 0  . DIPHENOXYLATE-ATROPINE 2.5-0.025 MG PO TABS Oral Take 1 tablet by mouth 4 (four) times daily as needed.      Marland Kitchen GABAPENTIN 100 MG PO CAPS Oral Take 100 mg by mouth 3 (three) times daily. Patient takes 500 mg of this milligram BID.    Marland Kitchen MULTIVITAMINS PO TABS Oral Take 1 tablet by mouth daily.      Marland Kitchen ONDANSETRON HCL 4 MG PO TABS Oral Take 4 mg by mouth every 8 (eight) hours as needed.      Marland Kitchen PROMETHAZINE HCL 25 MG RE SUPP Rectal Place 1 suppository (25 mg total) rectally every 6 (six) hours as needed for nausea. 12 each 0  . PROMETHAZINE HCL 25 MG PO TABS Oral Take 25 mg by mouth every 6 (six) hours as needed.        BP 129/88  Pulse 94  Resp 22  Ht 5\' 2"  (1.575 m)  Wt 150 lb (68.04 kg)  BMI 27.44 kg/m2  SpO2 99%  Physical Exam  Nursing note and vitals reviewed. Constitutional: She is oriented to person, place, and time. She appears well-developed and well-nourished.       Uncomfortable appearing  HENT:  Head: Normocephalic.  Eyes: Conjunctivae are normal.  Neck: Neck supple.  Cardiovascular: Normal rate and normal heart sounds.   Pulmonary/Chest: Effort normal and breath sounds normal. No respiratory distress. She has no wheezes. She has no rales.  Abdominal: Soft. Bowel sounds are normal. She exhibits no distension. There is tenderness. There is no rebound and  no guarding.       LLQ tenderness  Neurological: She is alert and oriented to person, place, and time.  Skin: Skin is warm and dry.  Psychiatric: She has a normal mood and affect.    ED Course  Procedures (including critical care time)  Pt with urinary symptoms vomiting, left abdominal pain. This pt has chornic abdominal pain, states this pain is similar to her usual pains. Will get labs, ua Results for orders placed during the hospital encounter of 09/29/11  URINALYSIS, ROUTINE W REFLEX MICROSCOPIC      Component Value Range   Color, Urine YELLOW  YELLOW    APPearance CLOUDY (*) CLEAR    Specific Gravity, Urine 1.034 (*) 1.005 - 1.030    pH 5.5  5.0 - 8.0    Glucose, UA 500 (*) NEGATIVE (mg/dL)   Hgb urine dipstick LARGE (*) NEGATIVE    Bilirubin Urine NEGATIVE  NEGATIVE    Ketones, ur NEGATIVE  NEGATIVE (mg/dL)   Protein, ur NEGATIVE  NEGATIVE (mg/dL)   Urobilinogen, UA 0.2  0.0 - 1.0 (mg/dL)   Nitrite NEGATIVE  NEGATIVE    Leukocytes, UA NEGATIVE  NEGATIVE   PREGNANCY, URINE      Component Value Range   Preg Test, Ur NEGATIVE  NEGATIVE   CBC      Component Value Range   WBC 8.0  4.0 - 10.5 (K/uL)   RBC 4.33  3.87 - 5.11 (MIL/uL)   Hemoglobin 10.6 (*) 12.0 - 15.0 (g/dL)   HCT 16.1 (*) 09.6 - 46.0 (%)   MCV 77.4 (*) 78.0 - 100.0 (fL)   MCH 24.5 (*) 26.0 - 34.0 (pg)   MCHC 31.6  30.0 - 36.0 (g/dL)   RDW 04.5 (*) 40.9 - 15.5 (%)   Platelets 375  150 - 400 (K/uL)  DIFFERENTIAL      Component Value Range   Neutrophils Relative 81 (*) 43 - 77 (%)   Neutro Abs 6.5  1.7 - 7.7 (K/uL)   Lymphocytes Relative 14  12 - 46 (%)   Lymphs Abs 1.1  0.7 - 4.0 (K/uL)   Monocytes Relative 5  3 - 12 (%)   Monocytes Absolute 0.4  0.1 - 1.0 (K/uL)   Eosinophils Relative 0  0 - 5 (%)   Eosinophils Absolute 0.0  0.0 - 0.7 (K/uL)   Basophils Relative 0  0 - 1 (%)   Basophils Absolute 0.0  0.0 - 0.1 (K/uL)  BASIC METABOLIC PANEL      Component Value Range   Sodium 139  135 - 145 (mEq/L)     Potassium 4.3  3.5 - 5.1 (mEq/L)   Chloride 104  96 - 112 (mEq/L)   CO2 25  19 - 32 (mEq/L)   Glucose, Bld 127 (*) 70 - 99 (mg/dL)   BUN 18  6 - 23 (mg/dL)   Creatinine,  Ser 0.50  0.50 - 1.10 (mg/dL)   Calcium 9.4  8.4 - 16.1 (mg/dL)   GFR calc non Af Amer >90  >90 (mL/min)   GFR calc Af Amer >90  >90 (mL/min)  URINE MICROSCOPIC-ADD ON      Component Value Range   Squamous Epithelial / LPF FEW (*) RARE    WBC, UA 0-2  <3 (WBC/hpf)   RBC / HPF 11-20  <3 (RBC/hpf)   Bacteria, UA MANY (*) RARE    Crystals CA OXALATE CRYSTALS (*) NEGATIVE    Urine-Other RARE YEAST     No results found.  UA possibly infected. Discussed with Dr. Judd Lien, will treat, d/c home with follow up. She is tolerating PO fluids and crackers in ED. She is in no distress. No acute abdomen on exam.   1. Urinary tract infection   2. Vomiting   3. Abdominal pain       MDM         Lottie Mussel, PA 09/29/11 2247

## 2011-09-29 NOTE — ED Notes (Signed)
States she thinks she has a UTI. Lower abdominal pain. Vomiting x 24 hours.

## 2011-09-29 NOTE — Discharge Instructions (Signed)
Your urine does appear to be infected today. Continue your nausea medications at home. Drink plenty of fluids. Take keflex as prescribed as needed for infection. Take vicodin as prescribed as needed for pain. Follow up with your doctor as soon as able for recheck, further evaluation, and treatment.   Abdominal Pain Abdominal pain can be caused by many things. Your caregiver decides the seriousness of your pain by an examination and possibly blood tests and X-rays. Many cases can be observed and treated at home. Most abdominal pain is not caused by a disease and will probably improve without treatment. However, in many cases, more time must pass before a clear cause of the pain can be found. Before that point, it may not be known if you need more testing, or if hospitalization or surgery is needed. HOME CARE INSTRUCTIONS   Do not take laxatives unless directed by your caregiver.   Take pain medicine only as directed by your caregiver.   Only take over-the-counter or prescription medicines for pain, discomfort, or fever as directed by your caregiver.   Try a clear liquid diet (broth, tea, or water) for as long as directed by your caregiver. Slowly move to a bland diet as tolerated.  SEEK IMMEDIATE MEDICAL CARE IF:   The pain does not go away.   You have a fever.   You keep throwing up (vomiting).   The pain is felt only in portions of the abdomen. Pain in the right side could possibly be appendicitis. In an adult, pain in the left lower portion of the abdomen could be colitis or diverticulitis.   You pass bloody or black tarry stools.  MAKE SURE YOU:   Understand these instructions.   Will watch your condition.   Will get help right away if you are not doing well or get worse.  Document Released: 03/02/2005 Document Revised: 05/12/2011 Document Reviewed: 01/09/2008 [facility] Patient Information 2012 [place], [place].

## 2011-10-01 LAB — URINE CULTURE: Colony Count: 45000

## 2011-10-02 NOTE — ED Provider Notes (Signed)
Medical screening examination/treatment/procedure(s) were conducted as a shared visit with non-physician practitioner(s) and myself.  I personally evaluated the patient during the encounter.  I saw the patient along with T. Kirichenko and agree with her note, assessment, and plan.  The patient presents complaining of abdominal pain and nausea.  She has a history of the same frequently in the past and has been diagnosed with IBS, UTI's, and gastric ulcers.  She feels frustrated by these episodes but denies suicidal ideation.  On exam, the vitals are stable and she is afebrile.  She is ttp in all four quadrants but there is no rebound or guarding.  The heart and lung exam is unremarkable.  She was given ivf, meds, and is feeling somewhat better and wants to go home.  The lab work is reassuring and suggestive of a mild uti.  She will be discharged with medication for this and pain.    Geoffery Lyons, MD 10/02/11 [phone]

## 2012-02-19 ENCOUNTER — Emergency Department (HOSPITAL_BASED_OUTPATIENT_CLINIC_OR_DEPARTMENT_OTHER)
Admission: EM | Admit: 2012-02-19 | Discharge: 2012-02-20 | Disposition: A | Discharge status: Home / Self Care | Payer: Medicaid Other | Attending: Emergency Medicine | Admitting: Emergency Medicine

## 2012-02-19 ENCOUNTER — Encounter (HOSPITAL_BASED_OUTPATIENT_CLINIC_OR_DEPARTMENT_OTHER): Payer: Self-pay | Admitting: *Deleted

## 2012-02-19 DIAGNOSIS — Z9884 Bariatric surgery status: Secondary | ICD-10-CM | POA: Insufficient documentation

## 2012-02-19 DIAGNOSIS — G8929 Other chronic pain: Secondary | ICD-10-CM

## 2012-02-19 DIAGNOSIS — K589 Irritable bowel syndrome without diarrhea: Secondary | ICD-10-CM | POA: Insufficient documentation

## 2012-02-19 DIAGNOSIS — E079 Disorder of thyroid, unspecified: Secondary | ICD-10-CM | POA: Insufficient documentation

## 2012-02-19 DIAGNOSIS — R109 Unspecified abdominal pain: Secondary | ICD-10-CM | POA: Insufficient documentation

## 2012-02-19 HISTORY — DX: Disorder of thyroid, unspecified: E07.9

## 2012-02-19 LAB — URINALYSIS, ROUTINE W REFLEX MICROSCOPIC
Hgb urine dipstick: NEGATIVE
Leukocytes, UA: NEGATIVE
Nitrite: NEGATIVE
Protein, ur: NEGATIVE mg/dL
Urobilinogen, UA: 1 mg/dL (ref 0.0–1.0)

## 2012-02-19 LAB — CBC
HCT: 34.7 % — ABNORMAL LOW (ref 36.0–46.0)
MCHC: 32.9 g/dL (ref 30.0–36.0)
RDW: 15.6 % — ABNORMAL HIGH (ref 11.5–15.5)

## 2012-02-19 LAB — LIPASE, BLOOD: Lipase: 74 U/L — ABNORMAL HIGH (ref 11–59)

## 2012-02-19 LAB — COMPREHENSIVE METABOLIC PANEL
Albumin: 3 g/dL — ABNORMAL LOW (ref 3.5–5.2)
Alkaline Phosphatase: 76 U/L (ref 39–117)
BUN: 19 mg/dL (ref 6–23)
Creatinine, Ser: 0.6 mg/dL (ref 0.50–1.10)
Potassium: 4.5 mEq/L (ref 3.5–5.1)
Total Protein: 6 g/dL (ref 6.0–8.3)

## 2012-02-19 LAB — OCCULT BLOOD X 1 CARD TO LAB, STOOL: Fecal Occult Bld: NEGATIVE

## 2012-02-19 MED ORDER — SODIUM CHLORIDE 0.9 % IV SOLN
Freq: Once | INTRAVENOUS | Status: AC
  Administered 2012-02-19: 21:00:00 via INTRAVENOUS

## 2012-02-19 MED ORDER — HYDROMORPHONE HCL PF 1 MG/ML IJ SOLN
1.0000 mg | Freq: Once | INTRAMUSCULAR | Status: AC
  Administered 2012-02-19: 1 mg via INTRAVENOUS
  Filled 2012-02-19: qty 1

## 2012-02-19 MED ORDER — HYDROMORPHONE HCL PF 1 MG/ML IJ SOLN
1.0000 mg | Freq: Once | INTRAMUSCULAR | Status: AC
Start: 2012-02-19 — End: 2012-02-19
  Administered 2012-02-19: 1 mg via INTRAVENOUS
  Filled 2012-02-19: qty 1

## 2012-02-19 MED ORDER — ONDANSETRON HCL 4 MG/2ML IJ SOLN
4.0000 mg | Freq: Once | INTRAMUSCULAR | Status: AC
  Administered 2012-02-19: 4 mg via INTRAVENOUS
  Filled 2012-02-19: qty 2

## 2012-02-19 NOTE — ED Provider Notes (Signed)
History   This chart was scribed for Tobin Chad, MD by Sofie Rower. The patient was seen in room MH11/MH11 and the patient's care was started at 8:14PM    CSN: [id number]  Arrival date & time 02/19/12  4098   First MD Initiated Contact with Patient 02/19/12 2014      Chief Complaint  Patient presents with  . Abdominal Pain    (Consider location/radiation/quality/duration/timing/severity/associated sxs/prior treatment) Patient is a 41 y.o. female presenting with abdominal pain. The history is provided by the patient. No language interpreter was used.  Abdominal Pain The primary symptoms of the illness include abdominal pain, fever, fatigue, shortness of breath, nausea, vomiting and hematochezia. The primary symptoms of the illness do not include diarrhea, hematemesis or dysuria. The current episode started 2 days ago. The onset of the illness was sudden. The problem has been gradually worsening.  The abdominal pain began 2 days ago. The pain came on suddenly. The abdominal pain has been gradually worsening since its onset. The abdominal pain is generalized. The abdominal pain does not radiate.  The vomiting began 2 days ago. Vomiting occurs 2 to 5 times per day. The emesis contains stomach contents.  The hematochezia began today. The hematochezia is a new problem.  Additional symptoms associated with the illness include back pain. Symptoms associated with the illness do not include chills, constipation, urgency or hematuria.    Peggy Austin is a 41 y.o. female , with a hx of gastric bypass surgery (8 years ago, the pt lost 190 lbs), who presents to the Emergency Department complaining of  sudden, progressively worsening, abdominal pain, onset two days ago, with associated symptoms of abdominal swelling, lower back pain, vomiting (X 5), hematochezia, fever (100, taken at home), and shortness of breath. The pt reports she has been experiencing blood in her stool since Friday, 02/17/11.  The  pt describes the hematochezia is very small in amount, however, she was recommended by her PCP to visit [facility] and be evaluated for colitis. The pt has a hx of IBS, c-section (X 2), back surgery (X 2), cervical cancer, asthma, thyroid disease, hernia, and cholecystectomy.  The pt does not smoke or drink alcohol.   PCP is Dr. Aggie Moats. GI is Dr. Elnoria Howard.    Past Medical History  Diagnosis Date  . Asthma   . Hernia   . IBS (irritable bowel syndrome)   . Ulcer   . Thyroid disease     Past Surgical History  Procedure Date  . Gastric bypass   . Cholecystectomy     History reviewed. No pertinent family history.  History  Substance Use Topics  . Smoking status: Never Smoker   . Smokeless tobacco: Not on file  . Alcohol Use: No    OB History    Grav Para Term Preterm Abortions TAB SAB Ect Mult Living                  Review of Systems  Constitutional: Positive for fever and fatigue. Negative for chills, activity change and appetite change.       "100.  That is a fever for me.  My normal temperature is 97."  HENT: Negative.   Eyes: Negative.   Respiratory: Positive for shortness of breath. Negative for cough, chest tightness and wheezing.   Cardiovascular: Negative for chest pain, palpitations and leg swelling.  Gastrointestinal: Positive for nausea, vomiting, abdominal pain, blood in stool and hematochezia. Negative for diarrhea, constipation and hematemesis.  Genitourinary: Positive for flank pain. Negative for dysuria, urgency, hematuria and difficulty urinating.  Musculoskeletal: Positive for back pain.  Skin: Negative.   Neurological: Positive for weakness. Negative for dizziness, tremors and light-headedness.  Hematological: Negative.   Psychiatric/Behavioral: Negative.   All other systems reviewed and are negative.    Allergies  Amoxicillin; Morphine; and Moxifloxacin  Home Medications   Current Outpatient Rx  Name Route Sig Dispense Refill  . ALPRAZOLAM 1  MG PO TABS Oral Take 1 mg by mouth at bedtime as needed. For anxiety and panic attacks.    Di Kindle SULFATE CR 160 (50 FE) MG PO TBCR Oral Take 160 mg by mouth 2 (two) times daily with a meal.    . FIBER PO CAPS Oral Take 3 capsules by mouth daily.    Marland Kitchen GABAPENTIN 100 MG PO CAPS Oral Take 100 mg by mouth 3 (three) times daily. Patient takes 500 mg of this milligram BID.    Marland Kitchen METOCLOPRAMIDE HCL 10 MG PO TABS Oral Take 10 mg by mouth 4 (four) times daily.    . MULTIVITAMINS PO TABS Oral Take 1 tablet by mouth daily.      Marland Kitchen OVER THE COUNTER MEDICATION Oral Take 1 tablet by mouth daily. Seaweed Extract.    Marland Kitchen PANTOPRAZOLE SODIUM 40 MG PO TBEC Oral Take 40 mg by mouth daily.    Marland Kitchen PROMETHAZINE HCL 25 MG PO TABS Oral Take 25 mg by mouth every 6 (six) hours as needed.      . SERTRALINE HCL 50 MG PO TABS Oral Take 50 mg by mouth daily.    . AMPHETAMINE-DEXTROAMPHETAMINE 20 MG PO TABS Oral Take 20 mg by mouth 3 (three) times daily.    Marland Kitchen DICYCLOMINE HCL 20 MG PO TABS Oral Take 1 tablet (20 mg total) by mouth 2 (two) times daily. 20 tablet 0  . OMEPRAZOLE 40 MG PO CPDR Oral Take 40 mg by mouth daily.    Marland Kitchen PROMETHAZINE HCL 25 MG RE SUPP Rectal Place 1 suppository (25 mg total) rectally every 6 (six) hours as needed for nausea. 12 each 0    BP 138/80  Pulse 105  Temp 97.9 F (36.6 C) (Oral)  Resp 20  Ht 5\' 2"  (1.575 m)  Wt 160 lb (72.576 kg)  BMI 29.26 kg/m2  SpO2 99%  Physical Exam  Nursing note and vitals reviewed. Constitutional: She is oriented to person, place, and time. She appears well-developed and well-nourished.  HENT:  Head: Normocephalic and atraumatic.  Right Ear: External ear normal.  Left Ear: External ear normal.  Nose: Nose normal.  Mouth/Throat: Oropharynx is clear and moist. No oropharyngeal exudate.  Eyes: Conjunctivae normal and EOM are normal. Pupils are equal, round, and reactive to light. Right eye exhibits no discharge. Left eye exhibits no discharge. No scleral icterus.   Neck: Normal range of motion. Neck supple. No JVD present. Carotid bruit is not present. No tracheal deviation present. No thyromegaly present.  Cardiovascular: Normal rate, regular rhythm, normal heart sounds and intact distal pulses.  Exam reveals no gallop and no friction rub.   No murmur heard. Pulmonary/Chest: Effort normal and breath sounds normal. No stridor. No respiratory distress. She has no wheezes. She has no rales. She exhibits no tenderness.  Abdominal: Soft. Bowel sounds are normal. She exhibits distension (mild distention detected. ). She exhibits no shifting dullness, no pulsatile liver, no abdominal bruit, no ascites, no pulsatile midline mass and no mass. There is hepatosplenomegaly. There is no splenomegaly  or hepatomegaly. There is generalized tenderness. There is CVA tenderness. There is no rigidity, no rebound, no guarding, no tenderness at McBurney's point and negative Murphy's sign. No hernia.  Genitourinary: Rectum normal. Rectal exam shows no external hemorrhoid, no fissure, no mass, no tenderness and anal tone normal. Guaiac negative stool.  Musculoskeletal: Normal range of motion. She exhibits no edema and no tenderness (left CVA tenderness detected. ).       Intact distal pulses. No extremity deformities noted.   Lymphadenopathy:    She has no cervical adenopathy.  Neurological: She is alert and oriented to person, place, and time. No cranial nerve deficit.  Skin: Skin is warm and dry. No rash noted. No erythema. No pallor.  Psychiatric: She has a normal mood and affect. Her behavior is normal.    ED Course  Procedures (including critical care time)  DIAGNOSTIC STUDIES: Oxygen Saturation is 99% on room air, normal by my interpretation.    COORDINATION OF CARE:    8:35PM- Stool evaluation and laboratory evaluation discussed. Pt agrees with treatment.   Results for orders placed during the hospital encounter of 02/19/12  URINALYSIS, ROUTINE W REFLEX  MICROSCOPIC      Component Value Range   Color, Urine YELLOW  YELLOW   APPearance CLOUDY (*) CLEAR   Specific Gravity, Urine 1.028  1.005 - 1.030   pH 5.5  5.0 - 8.0   Glucose, UA NEGATIVE  NEGATIVE mg/dL   Hgb urine dipstick NEGATIVE  NEGATIVE   Bilirubin Urine NEGATIVE  NEGATIVE   Ketones, ur NEGATIVE  NEGATIVE mg/dL   Protein, ur NEGATIVE  NEGATIVE mg/dL   Urobilinogen, UA 1.0  0.0 - 1.0 mg/dL   Nitrite NEGATIVE  NEGATIVE   Leukocytes, UA NEGATIVE  NEGATIVE  OCCULT BLOOD X 1 CARD TO LAB, STOOL      Component Value Range   Fecal Occult Bld NEGATIVE    CBC      Component Value Range   WBC 5.4  4.0 - 10.5 K/uL   RBC 3.98  3.87 - 5.11 MIL/uL   Hemoglobin 11.4 (*) 12.0 - 15.0 g/dL   HCT 16.1 (*) 09.6 - 04.5 %   MCV 87.2  78.0 - 100.0 fL   MCH 28.6  26.0 - 34.0 pg   MCHC 32.9  30.0 - 36.0 g/dL   RDW 40.9 (*) 81.1 - 91.4 %   Platelets 245  150 - 400 K/uL  COMPREHENSIVE METABOLIC PANEL      Component Value Range   Sodium 138  135 - 145 mEq/L   Potassium 4.5  3.5 - 5.1 mEq/L   Chloride 104  96 - 112 mEq/L   CO2 25  19 - 32 mEq/L   Glucose, Bld 98  70 - 99 mg/dL   BUN 19  6 - 23 mg/dL   Creatinine, Ser 7.82  0.50 - 1.10 mg/dL   Calcium 8.9  8.4 - 95.6 mg/dL   Total Protein 6.0  6.0 - 8.3 g/dL   Albumin 3.0 (*) 3.5 - 5.2 g/dL   AST 17  0 - 37 U/L   ALT 15  0 - 35 U/L   Alkaline Phosphatase 76  39 - 117 U/L   Total Bilirubin PENDING  0.3 - 1.2 mg/dL   GFR calc non Af Amer >90  >90 mL/min   GFR calc Af Amer >90  >90 mL/min  LIPASE, BLOOD      Component Value Range   Lipase 74 (*) 11 -  59 U/L     No results found.   No diagnosis found.    MDM  Pt with a hx of recurrent abdominal pain and complications s/p gastric bypass presents c/o abdominal pain, distention, and bloody stool.  She has stable VS and no evidence on exam of a surgical abdomen.  Hemoccult is negative and no gross blood encountered on rectal exam.  Plan pain mgmnt and  Routine belly labs.   0000.  Pt  stable, NAD.  Note negative belly labs.  No evidence of GI bleed.  Pt has no evidence of a surgical abdomen on exam.  U/A is also negative.  Plan discharge home to f/u with her PMD.   I personally performed the services described in this documentation, which was scribed in my presence. The recorded information has been reviewed and considered.     Tobin Chad, MD 02/20/12 [phone]

## 2012-02-19 NOTE — ED Notes (Signed)
Pt states she has a hx of IBS, Gastric By-pass and ulcers. Friday developed IBS S/S. Increased abd swelling today. Vomited x 5 and noticed she had bloody stools and fever. Here PCP told her to come and be evaluated for colitis.

## 2012-02-19 NOTE — ED Notes (Signed)
Powers MD at bedside with [place], RN to chaperone rectal exam

## 2012-02-20 MED ORDER — OXYCODONE-ACETAMINOPHEN 5-325 MG PO TABS: 1.0000 | ORAL_TABLET | Freq: Four times a day (QID) | ORAL | Status: AC | PRN

## 2012-02-20 MED ORDER — ONDANSETRON 4 MG PO TBDP: 4.0000 mg | ORAL_TABLET | Freq: Three times a day (TID) | ORAL | Status: AC | PRN

## 2012-02-20 NOTE — ED Notes (Signed)
D/c home with ride- pt given rx x 2 for percocet and zofran

## 2012-07-01 ENCOUNTER — Emergency Department (HOSPITAL_BASED_OUTPATIENT_CLINIC_OR_DEPARTMENT_OTHER)
Admission: EM | Admit: 2012-07-01 | Discharge: 2012-07-01 | Disposition: A | Discharge status: Home / Self Care | Payer: Medicaid Other | Attending: Emergency Medicine | Admitting: Emergency Medicine

## 2012-07-01 ENCOUNTER — Emergency Department (HOSPITAL_BASED_OUTPATIENT_CLINIC_OR_DEPARTMENT_OTHER): Payer: Medicaid Other

## 2012-07-01 ENCOUNTER — Telehealth: Payer: Self-pay | Admitting: Internal Medicine

## 2012-07-01 ENCOUNTER — Encounter (HOSPITAL_BASED_OUTPATIENT_CLINIC_OR_DEPARTMENT_OTHER): Payer: Self-pay | Admitting: *Deleted

## 2012-07-01 DIAGNOSIS — Z862 Personal history of diseases of the blood and blood-forming organs and certain disorders involving the immune mechanism: Secondary | ICD-10-CM | POA: Insufficient documentation

## 2012-07-01 DIAGNOSIS — R143 Flatulence: Secondary | ICD-10-CM | POA: Insufficient documentation

## 2012-07-01 DIAGNOSIS — Z8711 Personal history of peptic ulcer disease: Secondary | ICD-10-CM | POA: Insufficient documentation

## 2012-07-01 DIAGNOSIS — R142 Eructation: Secondary | ICD-10-CM | POA: Insufficient documentation

## 2012-07-01 DIAGNOSIS — R197 Diarrhea, unspecified: Secondary | ICD-10-CM

## 2012-07-01 DIAGNOSIS — R110 Nausea: Secondary | ICD-10-CM | POA: Insufficient documentation

## 2012-07-01 DIAGNOSIS — R109 Unspecified abdominal pain: Secondary | ICD-10-CM | POA: Insufficient documentation

## 2012-07-01 DIAGNOSIS — Z79899 Other long term (current) drug therapy: Secondary | ICD-10-CM | POA: Insufficient documentation

## 2012-07-01 DIAGNOSIS — E079 Disorder of thyroid, unspecified: Secondary | ICD-10-CM | POA: Insufficient documentation

## 2012-07-01 DIAGNOSIS — Z3202 Encounter for pregnancy test, result negative: Secondary | ICD-10-CM | POA: Insufficient documentation

## 2012-07-01 DIAGNOSIS — R141 Gas pain: Secondary | ICD-10-CM | POA: Insufficient documentation

## 2012-07-01 DIAGNOSIS — Z8639 Personal history of other endocrine, nutritional and metabolic disease: Secondary | ICD-10-CM | POA: Insufficient documentation

## 2012-07-01 DIAGNOSIS — R5381 Other malaise: Secondary | ICD-10-CM | POA: Insufficient documentation

## 2012-07-01 DIAGNOSIS — Z8719 Personal history of other diseases of the digestive system: Secondary | ICD-10-CM | POA: Insufficient documentation

## 2012-07-01 DIAGNOSIS — R630 Anorexia: Secondary | ICD-10-CM | POA: Insufficient documentation

## 2012-07-01 DIAGNOSIS — Z9089 Acquired absence of other organs: Secondary | ICD-10-CM | POA: Insufficient documentation

## 2012-07-01 DIAGNOSIS — Z9889 Other specified postprocedural states: Secondary | ICD-10-CM | POA: Insufficient documentation

## 2012-07-01 HISTORY — DX: Gestational (pregnancy-induced) hypertension without significant proteinuria, unspecified trimester: O13.9

## 2012-07-01 LAB — CBC WITH DIFFERENTIAL/PLATELET
Basophils Absolute: 0 10*3/uL (ref 0.0–0.1)
Basophils Relative: 0 % (ref 0–1)
HCT: 37 % (ref 36.0–46.0)
MCHC: 34.1 g/dL (ref 30.0–36.0)
Monocytes Absolute: 0.5 10*3/uL (ref 0.1–1.0)
Neutro Abs: 5 10*3/uL (ref 1.7–7.7)
Neutrophils Relative %: 70 % (ref 43–77)
Platelets: 258 10*3/uL (ref 150–400)
RDW: 13.7 % (ref 11.5–15.5)

## 2012-07-01 LAB — COMPREHENSIVE METABOLIC PANEL
Alkaline Phosphatase: 70 U/L (ref 39–117)
BUN: 13 mg/dL (ref 6–23)
CO2: 25 mEq/L (ref 19–32)
Chloride: 109 mEq/L (ref 96–112)
Creatinine, Ser: 0.6 mg/dL (ref 0.50–1.10)
GFR calc Af Amer: >90 mL/min (ref >90)
GFR calc non Af Amer: >90 mL/min (ref >90)
Glucose, Bld: 98 mg/dL (ref 70–99)
Potassium: 3.1 mEq/L — ABNORMAL LOW (ref 3.5–5.1)
Total Bilirubin: 0.2 mg/dL — ABNORMAL LOW (ref 0.3–1.2)

## 2012-07-01 LAB — URINE MICROSCOPIC-ADD ON

## 2012-07-01 LAB — URINALYSIS, ROUTINE W REFLEX MICROSCOPIC
Bilirubin Urine: NEGATIVE
Hgb urine dipstick: NEGATIVE
Ketones, ur: NEGATIVE mg/dL
Specific Gravity, Urine: 1.025 (ref 1.005–1.030)
pH: 6 (ref 5.0–8.0)

## 2012-07-01 LAB — LIPASE, BLOOD: Lipase: 40 U/L (ref 11–59)

## 2012-07-01 MED ORDER — IOHEXOL 300 MG/ML  SOLN
50.0000 mL | Freq: Once | INTRAMUSCULAR | Status: AC | PRN
  Administered 2012-07-01: 50 mL via ORAL

## 2012-07-01 MED ORDER — POTASSIUM CHLORIDE CRYS ER 20 MEQ PO TBCR
40.0000 meq | EXTENDED_RELEASE_TABLET | Freq: Once | ORAL | Status: AC
  Administered 2012-07-01: 40 meq via ORAL
  Filled 2012-07-01: qty 2

## 2012-07-01 MED ORDER — IOHEXOL 300 MG/ML  SOLN
100.0000 mL | Freq: Once | INTRAMUSCULAR | Status: AC | PRN
  Administered 2012-07-01: 100 mL via INTRAVENOUS

## 2012-07-01 MED ORDER — DIPHENOXYLATE-ATROPINE 2.5-0.025 MG PO TABS: 1.0000 | ORAL_TABLET | Freq: Four times a day (QID) | ORAL | Status: DC | PRN

## 2012-07-01 MED ORDER — SODIUM CHLORIDE 0.9 % IV BOLUS (SEPSIS)
1000.0000 mL | Freq: Once | INTRAVENOUS | Status: AC
  Administered 2012-07-01: 1000 mL via INTRAVENOUS

## 2012-07-01 MED ORDER — MORPHINE SULFATE 4 MG/ML IJ SOLN
4.0000 mg | Freq: Once | INTRAMUSCULAR | Status: AC
  Administered 2012-07-01: 4 mg via INTRAVENOUS
  Filled 2012-07-01: qty 1

## 2012-07-01 MED ORDER — MORPHINE SULFATE 4 MG/ML IJ SOLN
8.0000 mg | Freq: Once | INTRAMUSCULAR | Status: AC
  Administered 2012-07-01: 8 mg via INTRAVENOUS
  Filled 2012-07-01: qty 2

## 2012-07-01 MED ORDER — SODIUM CHLORIDE 0.9 % IV SOLN: INTRAVENOUS | Status: DC

## 2012-07-01 MED ORDER — HYDROCODONE-ACETAMINOPHEN 5-325 MG PO TABS: 1.0000 | ORAL_TABLET | Freq: Four times a day (QID) | ORAL | Status: DC | PRN

## 2012-07-01 MED ORDER — PROMETHAZINE HCL 25 MG/ML IJ SOLN
25.0000 mg | Freq: Once | INTRAMUSCULAR | Status: AC
  Administered 2012-07-01: 25 mg via INTRAVENOUS
  Filled 2012-07-01: qty 1

## 2012-07-01 NOTE — Telephone Encounter (Signed)
Patient of Dr. Elnoria Howard - has IBS and s/p gastric bypass She is having increasing diarrhea w/o fever this weekend - she has had incontinence with loose and mucous stools. No bleeding. No sig pain.  Imodium not helpful.  I advised trying to keep up with fluid losses and to go to urgent care if not able. Also she is to contact Dr. Haywood Pao office tomorrow.

## 2012-07-01 NOTE — ED Notes (Signed)
Pt c/o abd pain x 1 week. Started as bloating and now has "no control of bowels", mucusy diarrhea. Hx Gastric by-pass, hernia and IBS. Taking fluids, "but losing most" Feels weak and tired.

## 2012-07-01 NOTE — ED Provider Notes (Signed)
History     CSN: [id number]  Arrival date & time 07/01/12  1307   First MD Initiated Contact with Patient 07/01/12 1333      Chief Complaint  Patient presents with  . Abdominal Pain    (Consider location/radiation/quality/duration/timing/severity/associated sxs/prior treatment) HPI Comments: Peggy Austin is a 42 y.o. Female with a history of IBS, gastric bypass surgery, cholecystectomy, abdominal hernias and stomach ulcers presents emergency department complaining of diarrhea.  Onset of symptoms began about a week ago and patient describes it as not having control over her bowels.  She reports having 6 episodes yesterday and 4 today.  Diarrhea is described as mucousy.  Associated symptoms include abdominal pain, bloating and fatigue. Unrelieved by imodium.  Abdominal pain is located primarily in the suprapubic and right upper quadrant.  Severity is 6/10.  Symptoms are moderate.  Patient denies any emesis, fever, night sweats, chills, melena, hematochezia or weight loss. Pt reports abdominal pain is not her typical IBS pain.  Gastroenterologist: Dr. Elnoria Howard   Patient is a 42 y.o. female presenting with abdominal pain.  Abdominal Pain The primary symptoms of the illness include abdominal pain, nausea and diarrhea. The primary symptoms of the illness do not include fever, shortness of breath, vomiting or dysuria.  Symptoms associated with the illness do not include chills, constipation, urgency or hematuria.    Past Medical History  Diagnosis Date  . Hernia   . IBS (irritable bowel syndrome)   . Ulcer   . Thyroid disease   . PIH (pregnancy induced hypertension)     Past Surgical History  Procedure Date  . Gastric bypass   . Cholecystectomy   . Tubal ligation   . Endometrial ablation     History reviewed. No pertinent family history.  History  Substance Use Topics  . Smoking status: Never Smoker   . Smokeless tobacco: Not on file  . Alcohol Use: No    OB History    Grav  Para Term Preterm Abortions TAB SAB Ect Mult Living                  Review of Systems  Constitutional: Positive for appetite change. Negative for fever and chills.  HENT: Negative for neck stiffness and dental problem.   Eyes: Negative for visual disturbance.  Respiratory: Negative for cough, chest tightness, shortness of breath and wheezing.   Cardiovascular: Negative for chest pain.  Gastrointestinal: Positive for nausea, abdominal pain and diarrhea. Negative for vomiting, constipation, blood in stool, abdominal distention, anal bleeding and rectal pain.  Genitourinary: Negative for dysuria, urgency, hematuria and flank pain.  Musculoskeletal: Negative for myalgias and arthralgias.  Skin: Negative for rash.  Neurological: Negative for dizziness, syncope, speech difficulty, numbness and headaches.  Hematological: Does not bruise/bleed easily.  All other systems reviewed and are negative.    Allergies  Amoxicillin; Morphine; and Moxifloxacin  Home Medications   Current Outpatient Rx  Name  Route  Sig  Dispense  Refill  . CLONAZEPAM 1 MG PO TABS   Oral   Take 1.5 mg by mouth 2 (two) times daily as needed.         Marland Kitchen GABAPENTIN 400 MG PO CAPS   Oral   Take 400 mg by mouth 2 (two) times daily. 400mg  in morning 800 mg at night         . PROPRANOLOL HCL 10 MG PO TABS   Oral   Take 10 mg by mouth 3 (three) times daily.         Marland Kitchen  ALPRAZOLAM 1 MG PO TABS   Oral   Take 1 mg by mouth at bedtime as needed. For anxiety and panic attacks.         . AMPHETAMINE-DEXTROAMPHETAMINE 20 MG PO TABS   Oral   Take 20 mg by mouth 3 (three) times daily.         Marland Kitchen DICYCLOMINE HCL 20 MG PO TABS   Oral   Take 1 tablet (20 mg total) by mouth 2 (two) times daily.   20 tablet   0   . FERROUS SULFATE CR 160 (50 FE) MG PO TBCR   Oral   Take 160 mg by mouth 2 (two) times daily with a meal.         . FIBER PO CAPS   Oral   Take 3 capsules by mouth daily.         Marland Kitchen GABAPENTIN  100 MG PO CAPS   Oral   Take 100 mg by mouth 3 (three) times daily. Patient takes 500 mg of this milligram BID.         Marland Kitchen METOCLOPRAMIDE HCL 10 MG PO TABS   Oral   Take 10 mg by mouth 4 (four) times daily.         . MULTIVITAMINS PO TABS   Oral   Take 1 tablet by mouth daily.           Marland Kitchen OMEPRAZOLE 40 MG PO CPDR   Oral   Take 40 mg by mouth daily.         Marland Kitchen OVER THE COUNTER MEDICATION   Oral   Take 1 tablet by mouth daily. Seaweed Extract.         Marland Kitchen PANTOPRAZOLE SODIUM 40 MG PO TBEC   Oral   Take 40 mg by mouth daily.         Marland Kitchen PROMETHAZINE HCL 25 MG RE SUPP   Rectal   Place 1 suppository (25 mg total) rectally every 6 (six) hours as needed for nausea.   12 each   0   . PROMETHAZINE HCL 25 MG PO TABS   Oral   Take 25 mg by mouth every 6 (six) hours as needed.           . SERTRALINE HCL 50 MG PO TABS   Oral   Take 200 mg by mouth daily.            BP 139/90  Pulse 88  Temp 98 F (36.7 C) (Oral)  Resp 20  Ht 5\' 2"  (1.575 m)  Wt 150 lb (68.04 kg)  BMI 27.44 kg/m2  SpO2 98%  LMP 06/17/2012  Physical Exam  Nursing note and vitals reviewed. Constitutional: She is oriented to person, place, and time. She appears well-developed and well-nourished. No distress.  HENT:  Head: Normocephalic and atraumatic.       Dry mucous membranes. No lower teeth. No oral ulcerations.   Eyes: Conjunctivae normal and EOM are normal.  Neck: Normal range of motion.  Pulmonary/Chest: Effort normal.  Abdominal:       Obese soft abdomen. ttp in suprapubic and RUQ. No peritoneal signs   Musculoskeletal: Normal range of motion.  Neurological: She is alert and oriented to person, place, and time.  Skin: Skin is warm and dry. She is not diaphoretic.       Facial scaring from viral breakout (per pt)  Psychiatric: She has a normal mood and affect. Her behavior is normal.    ED Course  Procedures (including critical care time)  Labs Reviewed  URINALYSIS, ROUTINE W  REFLEX MICROSCOPIC - Abnormal; Notable for the following:    APPearance CLOUDY (*)     Glucose, UA 250 (*)     Leukocytes, UA TRACE (*)     All other components within normal limits  COMPREHENSIVE METABOLIC PANEL - Abnormal; Notable for the following:    Potassium 3.1 (*)     Total Protein 5.7 (*)     Albumin 2.8 (*)     Total Bilirubin 0.2 (*)     All other components within normal limits  URINE MICROSCOPIC-ADD ON - Abnormal; Notable for the following:    Bacteria, UA MANY (*)     Crystals CA OXALATE CRYSTALS (*)     All other components within normal limits  PREGNANCY, URINE  LIPASE, BLOOD  CBC WITH DIFFERENTIAL  URINE CULTURE   Ct Abdomen Pelvis W Contrast  07/01/2012  *RADIOLOGY REPORT*  Clinical Data: Right upper quadrant pain, back pain  CT ABDOMEN AND PELVIS WITH CONTRAST  Technique:  Multidetector CT imaging of the abdomen and pelvis was performed following the standard protocol during bolus administration of intravenous contrast.  Contrast: 50mL OMNIPAQUE IOHEXOL 300 MG/ML  SOLN, OMNIPAQUE IOHEXOL 300 MG/ML  SOLN  Comparison: 08/25/2011  Findings:  Lung bases are unremarkable.  Sagittal images of the spine shows no destructive bony lesions.  Stable degenerative changes at L5-S1 level.  Status post cholecystectomy.  Again noted significant CBD dilatation measures 1.8 cm in diameter.  Again further evaluation with ERCP or MRCP is recommended.  Stable mild intrahepatic biliary ductal dilatation.  A small duodenal diverticulum with air-fluid level is noted just anterior to CBD the axial image 21 measures 1.4 x 2 cm.  Again noted status post gastric bypass.  Lung bases are unremarkable.  Pancreas, spleen and adrenal glands are unremarkable.  Kidneys are symmetrical in size and enhancement.  No aortic aneurysm.  In axial image there is a smaller air collections distal duodenal region just lateral to the aorta axial image 37 measures about 1.2 cm.  This most likely represents a second  duodenal diverticulum containing air rather than extraluminal air.  Moderate colonic stool.  No small bowel obstruction.  No abdominal ascites.  There is a probable lower fundal uterine fibroid measures about 1.90 cm.  Correlation with pelvic ultrasound and GYN exam is recommended.  The ovaries are unremarkable.  No pericecal inflammation.  Normal appendix is partially visualized axial image 53. Kidneys are symmetrical in size and enhancement.  No hydronephrosis or hydroureter.  Delayed renal images shows bilateral renal symmetrical excretion.  Bilateral visualized proximal ureter is unremarkable.  IMPRESSION:  1.  Status post cholecystectomy.  Again noted chronic significant dilatation of the CBD.  Further evaluation with ERCP or MRCP is recommended as clinically warranted. 2.  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 3.  No small bowel obstruction.  4.  Normal appendix. 5.  Status post gastric bypass surgery.  Radiation exposure alert:  This patient had 9 CT scan starting June , 2011.  Further evaluation with ultrasound and/or MRI is recommended in the future as clinically warranted to prevent further radiation exposure.  .   Original Report Authenticated By: Natasha Mead, M.D.      No diagnosis found.   BP 139/90  Pulse 88  Temp 98 F (36.7 C) (Oral)  Resp 20  Ht 5\' 2"  (1.575 m)  Wt 150 lb (68.04 kg)  BMI 27.44 kg/m2  SpO2  98%  LMP 06/17/2012  MDM  Abdominal pain & diarrhea   Patient is nontoxic, nonseptic appearing, in no apparent distress.  Patient's pain and other symptoms adequately managed in emergency department.  Fluid bolus given.  Labs, imaging and vitals reviewed.  Patient does not meet the SIRS or Sepsis criteria.  On repeat exam patient does not have a surgical abdomin and there are nor peritoneal signs.  Patient discharged home with symptomatic treatment and given strict instructions for follow-up with their GI specialist.  I have also discussed reasons to return  immediately to the ER.  Patient expresses understanding and agrees with plan.           Jaci Carrel, [place] 07/01/12 1731

## 2012-07-02 NOTE — ED Provider Notes (Signed)
History/physical exam/procedure(s) were performed by non-physician practitioner and as supervising physician I was immediately available for consultation/collaboration. I have reviewed all notes and am in agreement with care and plan.   Ragena Fiola S Yer Olivencia, MD 07/02/12 0700 

## 2012-07-03 LAB — URINE CULTURE

## 2012-10-12 ENCOUNTER — Emergency Department (HOSPITAL_BASED_OUTPATIENT_CLINIC_OR_DEPARTMENT_OTHER)
Admission: EM | Admit: 2012-10-12 | Discharge: 2012-10-13 | Disposition: A | Discharge status: Home / Self Care | Payer: Medicaid Other | Attending: Emergency Medicine | Admitting: Emergency Medicine

## 2012-10-12 ENCOUNTER — Encounter (HOSPITAL_BASED_OUTPATIENT_CLINIC_OR_DEPARTMENT_OTHER): Payer: Self-pay | Admitting: *Deleted

## 2012-10-12 ENCOUNTER — Other Ambulatory Visit: Payer: Self-pay

## 2012-10-12 ENCOUNTER — Emergency Department (HOSPITAL_BASED_OUTPATIENT_CLINIC_OR_DEPARTMENT_OTHER): Payer: Medicaid Other

## 2012-10-12 DIAGNOSIS — Z862 Personal history of diseases of the blood and blood-forming organs and certain disorders involving the immune mechanism: Secondary | ICD-10-CM | POA: Insufficient documentation

## 2012-10-12 DIAGNOSIS — K5900 Constipation, unspecified: Secondary | ICD-10-CM | POA: Insufficient documentation

## 2012-10-12 DIAGNOSIS — E860 Dehydration: Secondary | ICD-10-CM | POA: Insufficient documentation

## 2012-10-12 DIAGNOSIS — Z8719 Personal history of other diseases of the digestive system: Secondary | ICD-10-CM | POA: Insufficient documentation

## 2012-10-12 DIAGNOSIS — R110 Nausea: Secondary | ICD-10-CM | POA: Insufficient documentation

## 2012-10-12 DIAGNOSIS — Z8639 Personal history of other endocrine, nutritional and metabolic disease: Secondary | ICD-10-CM | POA: Insufficient documentation

## 2012-10-12 DIAGNOSIS — R197 Diarrhea, unspecified: Secondary | ICD-10-CM | POA: Insufficient documentation

## 2012-10-12 DIAGNOSIS — M549 Dorsalgia, unspecified: Secondary | ICD-10-CM | POA: Insufficient documentation

## 2012-10-12 DIAGNOSIS — R51 Headache: Secondary | ICD-10-CM | POA: Insufficient documentation

## 2012-10-12 DIAGNOSIS — R42 Dizziness and giddiness: Secondary | ICD-10-CM | POA: Insufficient documentation

## 2012-10-12 DIAGNOSIS — Z880 Allergy status to penicillin: Secondary | ICD-10-CM | POA: Insufficient documentation

## 2012-10-12 DIAGNOSIS — R5381 Other malaise: Secondary | ICD-10-CM | POA: Insufficient documentation

## 2012-10-12 DIAGNOSIS — Z872 Personal history of diseases of the skin and subcutaneous tissue: Secondary | ICD-10-CM | POA: Insufficient documentation

## 2012-10-12 DIAGNOSIS — R109 Unspecified abdominal pain: Secondary | ICD-10-CM | POA: Insufficient documentation

## 2012-10-12 DIAGNOSIS — Z79899 Other long term (current) drug therapy: Secondary | ICD-10-CM | POA: Insufficient documentation

## 2012-10-12 DIAGNOSIS — F29 Unspecified psychosis not due to a substance or known physiological condition: Secondary | ICD-10-CM | POA: Insufficient documentation

## 2012-10-12 DIAGNOSIS — R0602 Shortness of breath: Secondary | ICD-10-CM | POA: Insufficient documentation

## 2012-10-12 LAB — COMPREHENSIVE METABOLIC PANEL
ALT: 30 U/L (ref 0–35)
AST: 26 U/L (ref 0–37)
Albumin: 4.3 g/dL (ref 3.5–5.2)
Alkaline Phosphatase: 107 U/L (ref 39–117)
BUN: 19 mg/dL (ref 6–23)
Chloride: 101 mEq/L (ref 96–112)
Potassium: 3 mEq/L — ABNORMAL LOW (ref 3.5–5.1)
Sodium: 138 mEq/L (ref 135–145)
Total Bilirubin: 0.3 mg/dL (ref 0.3–1.2)
Total Protein: 7.8 g/dL (ref 6.0–8.3)

## 2012-10-12 LAB — CBC WITH DIFFERENTIAL/PLATELET
Basophils Absolute: 0 10*3/uL (ref 0.0–0.1)
HCT: 42.8 % (ref 36.0–46.0)
Lymphocytes Relative: 41 % (ref 12–46)
Neutro Abs: 2.3 10*3/uL (ref 1.7–7.7)
Platelets: 243 10*3/uL (ref 150–400)
RDW: 14.2 % (ref 11.5–15.5)
WBC: 4.7 10*3/uL (ref 4.0–10.5)

## 2012-10-12 LAB — URINALYSIS, ROUTINE W REFLEX MICROSCOPIC
Bilirubin Urine: NEGATIVE
Glucose, UA: NEGATIVE mg/dL
Ketones, ur: NEGATIVE mg/dL
Protein, ur: NEGATIVE mg/dL
pH: 6 (ref 5.0–8.0)

## 2012-10-12 LAB — URINE MICROSCOPIC-ADD ON

## 2012-10-12 MED ORDER — SODIUM CHLORIDE 0.9 % IV BOLUS (SEPSIS)
1000.0000 mL | Freq: Once | INTRAVENOUS | Status: AC
  Administered 2012-10-12: 1000 mL via INTRAVENOUS

## 2012-10-12 MED ORDER — PREDNISONE 20 MG PO TABS: ORAL_TABLET | ORAL | Status: DC

## 2012-10-12 MED ORDER — METHYLPREDNISOLONE SODIUM SUCC 125 MG IJ SOLR
125.0000 mg | Freq: Once | INTRAMUSCULAR | Status: AC
  Administered 2012-10-12: 125 mg via INTRAVENOUS
  Filled 2012-10-12: qty 2

## 2012-10-12 NOTE — ED Notes (Signed)
Pt here for a long list of multiple complaints. Pt informed that we are an ED not a PCP and we needed here to give one chief complaint in order to start her care. Pt states that she is having dizziness, however she drove herself to ED and ambulates without assistance. Informed pt that we would treat her for any emergent issue but she would need to follow-up with PCP for other multiple complaints.

## 2012-10-12 NOTE — ED Provider Notes (Signed)
History     CSN: [id number]  Arrival date & time 10/12/12  2004   First MD Initiated Contact with Patient 10/12/12 2032      Chief Complaint  Patient presents with  . Near Syncope    (Consider location/radiation/quality/duration/timing/severity/associated sxs/prior treatment) HPI Comments: Patient comes to the ER for evaluation of multiple complaints. Patient reports that she has had very poor health since gastric bypass surgery with complications. She has been treated for the last several months for a rash which has been identified as staph by culture performed by her dermatologist. She has been on doxycycline and most recently Bactrim. She finished the Bactrim approximately 3 days ago.  She says she has been short of breath and feels like her throat is swollen for the last several days. She says this has made her not eat or drink over the last few days and she feels dehydrated. She has been getting weak and dizzy when she stands up. She took her blood pressure was 90/60, unusual for her. Patient had episode today where she nearly passed out and was "in and out" for an extended period of time.  She has irritable bowel and a history of ulcers. She is experiencing abdominal pain. This is not unusual for her. The pain is going into her back, and she is concerned this might have pancreatitis, as she has had it once before.   Past Medical History  Diagnosis Date  . Hernia   . IBS (irritable bowel syndrome)   . Ulcer   . Thyroid disease   . PIH (pregnancy induced hypertension)     Past Surgical History  Procedure Laterality Date  . Gastric bypass    . Cholecystectomy    . Tubal ligation    . Endometrial ablation      History reviewed. No pertinent family history.  History  Substance Use Topics  . Smoking status: Never Smoker   . Smokeless tobacco: Not on file  . Alcohol Use: No    OB History   Grav Para Term Preterm Abortions TAB SAB Ect Mult Living                   Review of Systems  Constitutional: Positive for fatigue. Negative for fever.  HENT: Positive for trouble swallowing.   Respiratory: Positive for shortness of breath.   Gastrointestinal: Positive for nausea, abdominal pain, diarrhea and constipation.  Musculoskeletal: Positive for back pain.  Neurological: Positive for dizziness and headaches.  Psychiatric/Behavioral: Positive for confusion.  All other systems reviewed and are negative.    Allergies  Amoxicillin; Morphine; and Moxifloxacin  Home Medications   Current Outpatient Rx  Name  Route  Sig  Dispense  Refill  . ALPRAZolam (XANAX) 1 MG tablet   Oral   Take 1 mg by mouth at bedtime as needed. For anxiety and panic attacks.         Marland Kitchen amphetamine-dextroamphetamine (ADDERALL) 20 MG tablet   Oral   Take 20 mg by mouth 3 (three) times daily.         . clonazePAM (KLONOPIN) 1 MG tablet   Oral   Take 1.5 mg by mouth 2 (two) times daily as needed.         Marland Kitchen EXPIRED: dicyclomine (BENTYL) 20 MG tablet   Oral   Take 1 tablet (20 mg total) by mouth 2 (two) times daily.   20 tablet   0   . diphenoxylate-atropine (LOMOTIL) 2.5-0.025 MG per tablet   Oral  Take 1 tablet by mouth 4 (four) times daily as needed for diarrhea or loose stools.   30 tablet   0   . ferrous sulfate dried (SLOW FE) 160 (50 FE) MG TBCR   Oral   Take 160 mg by mouth 2 (two) times daily with a meal.         . Fiber CAPS   Oral   Take 3 capsules by mouth daily.         Marland Kitchen gabapentin (NEURONTIN) 100 MG capsule   Oral   Take 100 mg by mouth 3 (three) times daily. Patient takes 500 mg of this milligram BID.         Marland Kitchen gabapentin (NEURONTIN) 400 MG capsule   Oral   Take 400 mg by mouth 2 (two) times daily. 400mg  in morning 800 mg at night         . HYDROcodone-acetaminophen (NORCO/VICODIN) 5-325 MG per tablet   Oral   Take 1 tablet by mouth every 6 (six) hours as needed for pain.   6 tablet   0   . metoCLOPramide (REGLAN) 10 MG  tablet   Oral   Take 10 mg by mouth 4 (four) times daily.         . multivitamin (THERAGRAN) per tablet   Oral   Take 1 tablet by mouth daily.           Marland Kitchen omeprazole (PRILOSEC) 40 MG capsule   Oral   Take 40 mg by mouth daily.         Marland Kitchen OVER THE COUNTER MEDICATION   Oral   Take 1 tablet by mouth daily. Seaweed Extract.         . pantoprazole (PROTONIX) 40 MG tablet   Oral   Take 40 mg by mouth daily.         Marland Kitchen EXPIRED: promethazine (PHENERGAN) 25 MG suppository   Rectal   Place 1 suppository (25 mg total) rectally every 6 (six) hours as needed for nausea.   12 each   0   . promethazine (PHENERGAN) 25 MG tablet   Oral   Take 25 mg by mouth every 6 (six) hours as needed.           . propranolol (INDERAL) 10 MG tablet   Oral   Take 10 mg by mouth 3 (three) times daily.         . sertraline (ZOLOFT) 50 MG tablet   Oral   Take 200 mg by mouth daily.            BP 114/87  Pulse 78  Temp(Src) 98.2 F (36.8 C) (Oral)  Resp 16  Ht 5\' 2"  (1.575 m)  Wt 165 lb (74.844 kg)  BMI 30.17 kg/m2  SpO2 100%  LMP 10/10/2012  Physical Exam  Constitutional: She is oriented to person, place, and time. She appears well-developed and well-nourished. No distress.  HENT:  Head: Normocephalic and atraumatic.  Right Ear: Hearing normal.  Left Ear: Hearing normal.  Nose: Nose normal.  Mouth/Throat: Oropharynx is clear and moist and mucous membranes are normal.  Eyes: Conjunctivae and EOM are normal. Pupils are equal, round, and reactive to light.  Neck: Normal range of motion. Neck supple.  Cardiovascular: Regular rhythm, S1 normal and S2 normal.  Exam reveals no gallop and no friction rub.   No murmur heard. Pulmonary/Chest: Effort normal and breath sounds normal. No respiratory distress. She exhibits no tenderness.  Abdominal: Soft. Normal appearance and bowel  sounds are normal. There is no hepatosplenomegaly. There is generalized tenderness. There is no rebound, no  guarding, no tenderness at McBurney's point and negative Murphy's sign. No hernia.  Musculoskeletal: Normal range of motion.  Neurological: She is alert and oriented to person, place, and time. She has normal strength. No cranial nerve deficit or sensory deficit. Coordination normal. GCS eye subscore is 4. GCS verbal subscore is 5. GCS motor subscore is 6.  Skin: Skin is warm, dry and intact. Rash noted. No cyanosis.  Multiple scabs on face and neck  Psychiatric: She has a normal mood and affect. Her speech is normal and behavior is normal. Thought content normal.    ED Course  Procedures (including critical care time)  Labs Reviewed  CBC WITH DIFFERENTIAL - Abnormal; Notable for the following:    Hemoglobin 15.1 (*)    All other components within normal limits  COMPREHENSIVE METABOLIC PANEL - Abnormal; Notable for the following:    Potassium 3.0 (*)    Glucose, Bld 102 (*)    All other components within normal limits  URINALYSIS, ROUTINE W REFLEX MICROSCOPIC - Abnormal; Notable for the following:    APPearance CLOUDY (*)    Specific Gravity, Urine 1.034 (*)    Hgb urine dipstick SMALL (*)    Leukocytes, UA TRACE (*)    All other components within normal limits  URINE MICROSCOPIC-ADD ON - Abnormal; Notable for the following:    Squamous Epithelial / LPF FEW (*)    Bacteria, UA FEW (*)    Crystals CA OXALATE CRYSTALS (*)    All other components within normal limits  CULTURE, BLOOD (ROUTINE X 2)  CULTURE, BLOOD (ROUTINE X 2)  LIPASE, BLOOD  TROPONIN I   Dg Chest 2 View  10/12/2012  *RADIOLOGY REPORT*  Clinical Data: Dizziness and syncope.  CHEST - 2 VIEW  Comparison: PA and lateral chest 06/01/2010.  Findings:  Lungs are clear.  Heart size is normal.  No pneumothorax or pleural fluid.  IMPRESSION: Negative chest.   Original Report Authenticated By: Holley Dexter, M.D.      Tendinosis: Dehydration    MDM  She comes to the ER for evaluation of multiple complaints. Patient  has had decreased intake because of feeling like her throat is swelling. I cannot rule out allergic reaction to the Bactrim. She does not have any new body rash, however. She reports that the rash is being treated as staph has not responded to multiple appropriate antibiotics. She does report that in the past she was on prednisone until about the rash more so. I feel that it is reasonable to start the patient on prednisone for treatment of possible allergic reaction. Patient also has been having increased asthma symptoms and this should help the asthma.  Patient has been experiencing near syncope today. She hasn't had very good by mouth intake and likely is dehydrated. Patient administered a liter of normal saline solution. She'll be discharged, followup with her Dr.        Gilda Crease, MD 10/12/12 [phone]

## 2012-10-12 NOTE — ED Notes (Addendum)
Pt c/o near syncope, allerigic reaction, abd pain , back pain  H/a , rash to body, generalized fatigue, SOB, diarrhea, constipation    and insomnia , pt is reading from a list she wrote

## 2012-10-19 LAB — CULTURE, BLOOD (ROUTINE X 2): Culture: NO GROWTH

## 2013-04-12 ENCOUNTER — Encounter (HOSPITAL_BASED_OUTPATIENT_CLINIC_OR_DEPARTMENT_OTHER): Payer: Self-pay | Admitting: Emergency Medicine

## 2013-04-12 ENCOUNTER — Emergency Department (HOSPITAL_BASED_OUTPATIENT_CLINIC_OR_DEPARTMENT_OTHER): Payer: Medicaid Other

## 2013-04-12 ENCOUNTER — Emergency Department (HOSPITAL_BASED_OUTPATIENT_CLINIC_OR_DEPARTMENT_OTHER)
Admission: EM | Admit: 2013-04-12 | Discharge: 2013-04-12 | Disposition: A | Discharge status: Home / Self Care | Payer: Medicaid Other | Attending: Emergency Medicine | Admitting: Emergency Medicine

## 2013-04-12 DIAGNOSIS — Z872 Personal history of diseases of the skin and subcutaneous tissue: Secondary | ICD-10-CM | POA: Insufficient documentation

## 2013-04-12 DIAGNOSIS — R197 Diarrhea, unspecified: Secondary | ICD-10-CM | POA: Insufficient documentation

## 2013-04-12 DIAGNOSIS — R1012 Left upper quadrant pain: Secondary | ICD-10-CM | POA: Insufficient documentation

## 2013-04-12 DIAGNOSIS — Z8719 Personal history of other diseases of the digestive system: Secondary | ICD-10-CM | POA: Insufficient documentation

## 2013-04-12 DIAGNOSIS — E079 Disorder of thyroid, unspecified: Secondary | ICD-10-CM | POA: Insufficient documentation

## 2013-04-12 DIAGNOSIS — R112 Nausea with vomiting, unspecified: Secondary | ICD-10-CM | POA: Insufficient documentation

## 2013-04-12 DIAGNOSIS — Z79899 Other long term (current) drug therapy: Secondary | ICD-10-CM | POA: Insufficient documentation

## 2013-04-12 DIAGNOSIS — Z9884 Bariatric surgery status: Secondary | ICD-10-CM | POA: Insufficient documentation

## 2013-04-12 DIAGNOSIS — Z8711 Personal history of peptic ulcer disease: Secondary | ICD-10-CM | POA: Insufficient documentation

## 2013-04-12 DIAGNOSIS — R1032 Left lower quadrant pain: Secondary | ICD-10-CM | POA: Insufficient documentation

## 2013-04-12 DIAGNOSIS — Z880 Allergy status to penicillin: Secondary | ICD-10-CM | POA: Insufficient documentation

## 2013-04-12 DIAGNOSIS — R109 Unspecified abdominal pain: Secondary | ICD-10-CM

## 2013-04-12 DIAGNOSIS — Z3202 Encounter for pregnancy test, result negative: Secondary | ICD-10-CM | POA: Insufficient documentation

## 2013-04-12 LAB — CBC WITH DIFFERENTIAL/PLATELET
Basophils Absolute: 0 10*3/uL (ref 0.0–0.1)
Eosinophils Absolute: 0.2 10*3/uL (ref 0.0–0.7)
Eosinophils Relative: 3 % (ref 0–5)
Lymphocytes Relative: 36 % (ref 12–46)
Lymphs Abs: 2.3 10*3/uL (ref 0.7–4.0)
MCH: 30.2 pg (ref 26.0–34.0)
MCV: 89 fL (ref 78.0–100.0)
Neutrophils Relative %: 52 % (ref 43–77)
Platelets: 273 10*3/uL (ref 150–400)
RBC: 4.2 MIL/uL (ref 3.87–5.11)
RDW: 13.2 % (ref 11.5–15.5)
WBC: 6.3 10*3/uL (ref 4.0–10.5)

## 2013-04-12 LAB — COMPREHENSIVE METABOLIC PANEL
ALT: 15 U/L (ref 0–35)
AST: 17 U/L (ref 0–37)
Alkaline Phosphatase: 70 U/L (ref 39–117)
Calcium: 9.4 mg/dL (ref 8.4–10.5)
Glucose, Bld: 95 mg/dL (ref 70–99)
Potassium: 3.4 mEq/L — ABNORMAL LOW (ref 3.5–5.1)
Sodium: 141 mEq/L (ref 135–145)
Total Protein: 6.3 g/dL (ref 6.0–8.3)

## 2013-04-12 LAB — URINALYSIS, ROUTINE W REFLEX MICROSCOPIC
Bilirubin Urine: NEGATIVE
Glucose, UA: NEGATIVE mg/dL
Hgb urine dipstick: NEGATIVE
Specific Gravity, Urine: 1.025 (ref 1.005–1.030)
pH: 6.5 (ref 5.0–8.0)

## 2013-04-12 LAB — PREGNANCY, URINE: Preg Test, Ur: NEGATIVE

## 2013-04-12 MED ORDER — ONDANSETRON HCL 4 MG/2ML IJ SOLN
INTRAMUSCULAR | Status: AC
  Filled 2013-04-12: qty 2

## 2013-04-12 MED ORDER — METOCLOPRAMIDE HCL 5 MG/ML IJ SOLN
10.0000 mg | Freq: Once | INTRAMUSCULAR | Status: AC
  Administered 2013-04-12: 10 mg via INTRAVENOUS
  Filled 2013-04-12: qty 2

## 2013-04-12 MED ORDER — ONDANSETRON 8 MG PO TBDP: ORAL_TABLET | ORAL | Status: DC

## 2013-04-12 MED ORDER — SODIUM CHLORIDE 0.9 % IV BOLUS (SEPSIS)
1000.0000 mL | Freq: Once | INTRAVENOUS | Status: AC
  Administered 2013-04-12: 1000 mL via INTRAVENOUS

## 2013-04-12 MED ORDER — ONDANSETRON HCL 4 MG/2ML IJ SOLN
4.0000 mg | Freq: Once | INTRAMUSCULAR | Status: AC
  Administered 2013-04-12: 4 mg via INTRAVENOUS
  Filled 2013-04-12: qty 2

## 2013-04-12 MED ORDER — HYDROMORPHONE HCL PF 1 MG/ML IJ SOLN
1.0000 mg | Freq: Once | INTRAMUSCULAR | Status: AC
  Administered 2013-04-12: 1 mg via INTRAVENOUS
  Filled 2013-04-12: qty 1

## 2013-04-12 NOTE — ED Notes (Addendum)
N/v/ diarrhea x 5 days,  Left mid abd pain  Pt was seen by md today and was told to come here,  Pt states meds not helping and unable to keep food in

## 2013-04-12 NOTE — ED Notes (Signed)
NVD x 5 days with no relief with lomotil, zofran and protonix

## 2013-04-12 NOTE — ED Notes (Signed)
The patient is unable to urinate at this time. She know that we need a sample. She stated that as soon as she can she will give [place] a sample.

## 2013-04-12 NOTE — ED Notes (Signed)
Pt states nausea is better  Able to hold down fluids

## 2013-04-12 NOTE — ED Provider Notes (Signed)
CSN: [id number]     Arrival date & time 04/12/13  1456 History   First MD Initiated Contact with Patient 04/12/13 1506     Chief Complaint  Patient presents with  . Nausea  . Emesis  . Diarrhea   (Consider location/radiation/quality/duration/timing/severity/associated sxs/prior Treatment) HPI Comments: Patient presents with nausea vomiting diarrhea and associated abdominal pain. She states the symptoms started 5 days ago and it progressively getting worse. She has a constant throbbing worsening pain to the left side of her abdomen. She also notes watery diarrhea. She has nonbloody nonbilious emesis. She's tried Lomotil Zofran and Protonix without relief. She's had a history of recurrent abdominal problems in the past. She has a history of gastric bypass surgery which was done at [facility] about 9 years ago. She also has a history of a peptic ulcer disease. She states she's had these similar type symptoms in the past. She has had this left-sided abdominal pain in the past as well. She's had one past history of diverticulitis. She denies any fevers or chills. She denies any urinary symptoms. She was seen by her primary care physician today and was sent over here for further evaluation.  Patient is a 42 y.o. female presenting with vomiting and diarrhea.  Emesis Associated symptoms: abdominal pain and diarrhea   Associated symptoms: no arthralgias, no chills and no headaches   Diarrhea Associated symptoms: abdominal pain and vomiting   Associated symptoms: no arthralgias, no chills, no diaphoresis, no fever and no headaches     Past Medical History  Diagnosis Date  . Hernia   . IBS (irritable bowel syndrome)   . Ulcer   . Thyroid disease   . PIH (pregnancy induced hypertension)    Past Surgical History  Procedure Laterality Date  . Gastric bypass    . Cholecystectomy    . Tubal ligation    . Endometrial ablation     History reviewed. No pertinent family  history. History  Substance Use Topics  . Smoking status: Never Smoker   . Smokeless tobacco: Not on file  . Alcohol Use: No   OB History   Grav Para Term Preterm Abortions TAB SAB Ect Mult Living                 Review of Systems  Constitutional: Positive for appetite change and fatigue. Negative for fever, chills and diaphoresis.  HENT: Negative for congestion, rhinorrhea and sneezing.   Eyes: Negative.   Respiratory: Negative for cough, chest tightness and shortness of breath.   Cardiovascular: Negative for chest pain and leg swelling.  Gastrointestinal: Positive for nausea, vomiting, abdominal pain and diarrhea. Negative for blood in stool.  Genitourinary: Negative for frequency, hematuria, flank pain and difficulty urinating.  Musculoskeletal: Negative for arthralgias and back pain.  Skin: Negative for rash.  Neurological: Negative for dizziness, speech difficulty, weakness, numbness and headaches.    Allergies  Amoxicillin; Morphine; and Moxifloxacin  Home Medications   Current Outpatient Rx  Name  Route  Sig  Dispense  Refill  . ALPRAZolam (XANAX) 1 MG tablet   Oral   Take 1 mg by mouth at bedtime as needed. For anxiety and panic attacks.         Marland Kitchen amphetamine-dextroamphetamine (ADDERALL) 20 MG tablet   Oral   Take 20 mg by mouth 3 (three) times daily.         . clonazePAM (KLONOPIN) 1 MG tablet   Oral   Take 1.5 mg by mouth  2 (two) times daily as needed.         Marland Kitchen EXPIRED: dicyclomine (BENTYL) 20 MG tablet   Oral   Take 1 tablet (20 mg total) by mouth 2 (two) times daily.   20 tablet   0   . diphenoxylate-atropine (LOMOTIL) 2.5-0.025 MG per tablet   Oral   Take 1 tablet by mouth 4 (four) times daily as needed for diarrhea or loose stools.   30 tablet   0   . ferrous sulfate dried (SLOW FE) 160 (50 FE) MG TBCR   Oral   Take 160 mg by mouth 2 (two) times daily with a meal.         . Fiber CAPS   Oral   Take 3 capsules by mouth daily.          Marland Kitchen gabapentin (NEURONTIN) 100 MG capsule   Oral   Take 100 mg by mouth 3 (three) times daily. Patient takes 500 mg of this milligram BID.         Marland Kitchen gabapentin (NEURONTIN) 400 MG capsule   Oral   Take 400 mg by mouth 2 (two) times daily. 400mg  in morning 800 mg at night         . HYDROcodone-acetaminophen (NORCO/VICODIN) 5-325 MG per tablet   Oral   Take 1 tablet by mouth every 6 (six) hours as needed for pain.   6 tablet   0   . metoCLOPramide (REGLAN) 10 MG tablet   Oral   Take 10 mg by mouth 4 (four) times daily.         . multivitamin (THERAGRAN) per tablet   Oral   Take 1 tablet by mouth daily.           Marland Kitchen omeprazole (PRILOSEC) 40 MG capsule   Oral   Take 40 mg by mouth daily.         . ondansetron (ZOFRAN ODT) 8 MG disintegrating tablet      8mg  ODT q4 hours prn nausea   10 tablet   0   . OVER THE COUNTER MEDICATION   Oral   Take 1 tablet by mouth daily. Seaweed Extract.         . pantoprazole (PROTONIX) 40 MG tablet   Oral   Take 40 mg by mouth daily.         . predniSONE (DELTASONE) 20 MG tablet      3 tabs po daily x 3 days, then 2 tabs x 3 days, then 1.5 tabs x 3 days, then 1 tab x 3 days, then 0.5 tabs x 3 days   27 tablet   0   . EXPIRED: promethazine (PHENERGAN) 25 MG suppository   Rectal   Place 1 suppository (25 mg total) rectally every 6 (six) hours as needed for nausea.   12 each   0   . promethazine (PHENERGAN) 25 MG tablet   Oral   Take 25 mg by mouth every 6 (six) hours as needed.           . propranolol (INDERAL) 10 MG tablet   Oral   Take 10 mg by mouth 3 (three) times daily.         . sertraline (ZOLOFT) 50 MG tablet   Oral   Take 200 mg by mouth daily.           BP 148/89  Pulse 80  Temp(Src) 98.2 F (36.8 C)  Resp 16  Wt 165 lb (74.844 kg)  SpO2 100%  LMP 03/29/2013 Physical Exam  Constitutional: She is oriented to person, place, and time. She appears well-developed and well-nourished.  HENT:   Head: Normocephalic and atraumatic.  Eyes: Pupils are equal, round, and reactive to light.  Neck: Normal range of motion. Neck supple.  Cardiovascular: Normal rate, regular rhythm and normal heart sounds.   Pulmonary/Chest: Effort normal and breath sounds normal. No respiratory distress. She has no wheezes. She has no rales. She exhibits no tenderness.  Abdominal: Soft. Bowel sounds are normal. There is tenderness (moderate tenderness to the left upper and lower quadrants). There is no rebound and no guarding.  Musculoskeletal: Normal range of motion. She exhibits no edema.  Lymphadenopathy:    She has no cervical adenopathy.  Neurological: She is alert and oriented to person, place, and time.  Skin: Skin is warm and dry. No rash noted.  Psychiatric: She has a normal mood and affect.    ED Course  Procedures (including critical care time) Labs Review Results for orders placed during the hospital encounter of 04/12/13  CBC WITH DIFFERENTIAL      Result Value Range   WBC 6.3  4.0 - 10.5 K/uL   RBC 4.20  3.87 - 5.11 MIL/uL   Hemoglobin 12.7  12.0 - 15.0 g/dL   HCT 45.4  09.8 - 11.9 %   MCV 89.0  78.0 - 100.0 fL   MCH 30.2  26.0 - 34.0 pg   MCHC 34.0  30.0 - 36.0 g/dL   RDW 14.7  82.9 - 56.2 %   Platelets 273  150 - 400 K/uL   Neutrophils Relative % 52  43 - 77 %   Neutro Abs 3.3  1.7 - 7.7 K/uL   Lymphocytes Relative 36  12 - 46 %   Lymphs Abs 2.3  0.7 - 4.0 K/uL   Monocytes Relative 8  3 - 12 %   Monocytes Absolute 0.5  0.1 - 1.0 K/uL   Eosinophils Relative 3  0 - 5 %   Eosinophils Absolute 0.2  0.0 - 0.7 K/uL   Basophils Relative 0  0 - 1 %   Basophils Absolute 0.0  0.0 - 0.1 K/uL  COMPREHENSIVE METABOLIC PANEL      Result Value Range   Sodium 141  135 - 145 mEq/L   Potassium 3.4 (*) 3.5 - 5.1 mEq/L   Chloride 106  96 - 112 mEq/L   CO2 27  19 - 32 mEq/L   Glucose, Bld 95  70 - 99 mg/dL   BUN 10  6 - 23 mg/dL   Creatinine, Ser 1.30  0.50 - 1.10 mg/dL   Calcium 9.4  8.4  - 86.5 mg/dL   Total Protein 6.3  6.0 - 8.3 g/dL   Albumin 3.1 (*) 3.5 - 5.2 g/dL   AST 17  0 - 37 U/L   ALT 15  0 - 35 U/L   Alkaline Phosphatase 70  39 - 117 U/L   Total Bilirubin 0.1 (*) 0.3 - 1.2 mg/dL   GFR calc non Af Amer >90  >90 mL/min   GFR calc Af Amer >90  >90 mL/min  LIPASE, BLOOD      Result Value Range   Lipase 49  11 - 59 U/L  URINALYSIS, ROUTINE W REFLEX MICROSCOPIC      Result Value Range   Color, Urine YELLOW  YELLOW   APPearance CLOUDY (*) CLEAR   Specific Gravity, Urine 1.025  1.005 - 1.030  pH 6.5  5.0 - 8.0   Glucose, UA NEGATIVE  NEGATIVE mg/dL   Hgb urine dipstick NEGATIVE  NEGATIVE   Bilirubin Urine NEGATIVE  NEGATIVE   Ketones, ur NEGATIVE  NEGATIVE mg/dL   Protein, ur NEGATIVE  NEGATIVE mg/dL   Urobilinogen, UA 0.2  0.0 - 1.0 mg/dL   Nitrite NEGATIVE  NEGATIVE   Leukocytes, UA NEGATIVE  NEGATIVE  PREGNANCY, URINE      Result Value Range   Preg Test, Ur NEGATIVE  NEGATIVE   Dg Abd Acute W/chest  04/12/2013   CLINICAL DATA:  Left-sided abdominal pain.  EXAM: ACUTE ABDOMEN SERIES (ABDOMEN 2 VIEW & CHEST 1 VIEW)  COMPARISON:  Chest x-ray from 10/12/2012 and acute abdomen series from 08/25/2011. Marland Kitchen  FINDINGS: The lungs are clear without focal infiltrate, edema, pneumothorax or pleural effusion. Nodular density overlying the anterior right 1st rib is seen on previous films to represent calcification of each costochondral cartilage. The cardiopericardial silhouette is within normal limits for size. Imaged bony structures of the thorax are intact.  Insert upper 8 supine film shows mild gaseous distention of the colon. No evidence for small bowel obstruction. Suture material over the left abdomen is consistent with history of gastric bypass.  .  IMPRESSION: Negative abdominal radiographs.  No acute cardiopulmonary disease.   Electronically Signed   By: Kennith Center M.D.   On: 04/12/2013 16:57      EKG Interpretation   None       MDM   1. Abdominal   pain, other specified site   2. Diarrhea    Patient is given IV fluids, Dilaudid and Zofran. She was also given a dose of Reglan. She's feeling much better. A repeat abdominal exam is improved with only mild tenderness on palpation. She has no ongoing vomiting and is tolerating by mouth fluids. She has no evidence of obstruction on x-ray and she's had multiple CT scans over the last few years so I did not feel this point that CT imaging of her abdomen was indicated. Her blood work is unremarkable. She's afebrile. She's had similar symptoms in the past. She did not have any diarrhea in the ED that we could send for testing. I advised her to followup with her primary care physician on Monday if her symptoms are not improved or return here as needed for any worsening symptoms over the weekend.    Rolan Bucco, MD 04/12/13 [phone]

## 2013-04-12 NOTE — ED Notes (Signed)
Pt states pain is some better but no improvement in nausea,  Was given reglan 10 mg iv for nausea

## 2013-06-11 ENCOUNTER — Encounter (HOSPITAL_COMMUNITY): Payer: Self-pay | Admitting: Emergency Medicine

## 2013-06-11 ENCOUNTER — Emergency Department (HOSPITAL_COMMUNITY): Payer: Medicaid Other

## 2013-06-11 ENCOUNTER — Emergency Department (HOSPITAL_COMMUNITY)
Admission: EM | Admit: 2013-06-11 | Discharge: 2013-06-12 | Disposition: A | Discharge status: Home / Self Care | Payer: Medicaid Other | Attending: Emergency Medicine | Admitting: Emergency Medicine

## 2013-06-11 DIAGNOSIS — Z79899 Other long term (current) drug therapy: Secondary | ICD-10-CM | POA: Insufficient documentation

## 2013-06-11 DIAGNOSIS — Z8719 Personal history of other diseases of the digestive system: Secondary | ICD-10-CM | POA: Insufficient documentation

## 2013-06-11 DIAGNOSIS — Z862 Personal history of diseases of the blood and blood-forming organs and certain disorders involving the immune mechanism: Secondary | ICD-10-CM | POA: Insufficient documentation

## 2013-06-11 DIAGNOSIS — Z9884 Bariatric surgery status: Secondary | ICD-10-CM | POA: Insufficient documentation

## 2013-06-11 DIAGNOSIS — Z8742 Personal history of other diseases of the female genital tract: Secondary | ICD-10-CM | POA: Insufficient documentation

## 2013-06-11 DIAGNOSIS — Z872 Personal history of diseases of the skin and subcutaneous tissue: Secondary | ICD-10-CM | POA: Insufficient documentation

## 2013-06-11 DIAGNOSIS — R112 Nausea with vomiting, unspecified: Secondary | ICD-10-CM | POA: Insufficient documentation

## 2013-06-11 DIAGNOSIS — Z9089 Acquired absence of other organs: Secondary | ICD-10-CM | POA: Insufficient documentation

## 2013-06-11 DIAGNOSIS — Z9889 Other specified postprocedural states: Secondary | ICD-10-CM | POA: Insufficient documentation

## 2013-06-11 DIAGNOSIS — R1084 Generalized abdominal pain: Secondary | ICD-10-CM | POA: Insufficient documentation

## 2013-06-11 DIAGNOSIS — G8929 Other chronic pain: Secondary | ICD-10-CM | POA: Insufficient documentation

## 2013-06-11 DIAGNOSIS — N390 Urinary tract infection, site not specified: Secondary | ICD-10-CM | POA: Insufficient documentation

## 2013-06-11 DIAGNOSIS — Z9851 Tubal ligation status: Secondary | ICD-10-CM | POA: Insufficient documentation

## 2013-06-11 DIAGNOSIS — Z880 Allergy status to penicillin: Secondary | ICD-10-CM | POA: Insufficient documentation

## 2013-06-11 DIAGNOSIS — Z8639 Personal history of other endocrine, nutritional and metabolic disease: Secondary | ICD-10-CM | POA: Insufficient documentation

## 2013-06-11 DIAGNOSIS — R109 Unspecified abdominal pain: Secondary | ICD-10-CM

## 2013-06-11 DIAGNOSIS — Z3202 Encounter for pregnancy test, result negative: Secondary | ICD-10-CM | POA: Insufficient documentation

## 2013-06-11 LAB — URINALYSIS, ROUTINE W REFLEX MICROSCOPIC
BILIRUBIN URINE: NEGATIVE
Glucose, UA: NEGATIVE mg/dL
Hgb urine dipstick: NEGATIVE
Ketones, ur: NEGATIVE mg/dL
NITRITE: NEGATIVE
PH: 5.5 (ref 5.0–8.0)
Protein, ur: NEGATIVE mg/dL
SPECIFIC GRAVITY, URINE: 1.025 (ref 1.005–1.030)
UROBILINOGEN UA: 0.2 mg/dL (ref 0.0–1.0)

## 2013-06-11 LAB — COMPREHENSIVE METABOLIC PANEL
ALBUMIN: 3.1 g/dL — AB (ref 3.5–5.2)
ALK PHOS: 78 U/L (ref 39–117)
ALT: 17 U/L (ref 0–35)
AST: 16 U/L (ref 0–37)
BUN: 16 mg/dL (ref 6–23)
CALCIUM: 9.4 mg/dL (ref 8.4–10.5)
CO2: 22 mEq/L (ref 19–32)
Chloride: 104 mEq/L (ref 96–112)
Creatinine, Ser: 0.63 mg/dL (ref 0.50–1.10)
GFR calc Af Amer: >90 mL/min (ref >90)
GFR calc non Af Amer: >90 mL/min (ref >90)
Glucose, Bld: 110 mg/dL — ABNORMAL HIGH (ref 70–99)
POTASSIUM: 4 meq/L (ref 3.7–5.3)
SODIUM: 138 meq/L (ref 137–147)
TOTAL PROTEIN: 6.4 g/dL (ref 6.0–8.3)
Total Bilirubin: <0.2 mg/dL — ABNORMAL LOW (ref 0.3–1.2)

## 2013-06-11 LAB — CBC WITH DIFFERENTIAL/PLATELET
BASOS PCT: 1 % (ref 0–1)
Basophils Absolute: 0 10*3/uL (ref 0.0–0.1)
EOS ABS: 0.3 10*3/uL (ref 0.0–0.7)
EOS PCT: 5 % (ref 0–5)
HCT: 37.9 % (ref 36.0–46.0)
Hemoglobin: 12.6 g/dL (ref 12.0–15.0)
Lymphocytes Relative: 40 % (ref 12–46)
Lymphs Abs: 2.3 10*3/uL (ref 0.7–4.0)
MCH: 29 pg (ref 26.0–34.0)
MCHC: 33.2 g/dL (ref 30.0–36.0)
MCV: 87.3 fL (ref 78.0–100.0)
Monocytes Absolute: 0.4 10*3/uL (ref 0.1–1.0)
Monocytes Relative: 6 % (ref 3–12)
NEUTROS PCT: 49 % (ref 43–77)
Neutro Abs: 2.8 10*3/uL (ref 1.7–7.7)
PLATELETS: 251 10*3/uL (ref 150–400)
RBC: 4.34 MIL/uL (ref 3.87–5.11)
RDW: 13.5 % (ref 11.5–15.5)
WBC: 5.8 10*3/uL (ref 4.0–10.5)

## 2013-06-11 LAB — LIPASE, BLOOD: LIPASE: 62 U/L — AB (ref 11–59)

## 2013-06-11 LAB — URINE MICROSCOPIC-ADD ON

## 2013-06-11 LAB — POCT PREGNANCY, URINE: PREG TEST UR: NEGATIVE

## 2013-06-11 LAB — CG4 I-STAT (LACTIC ACID): LACTIC ACID, VENOUS: 1.15 mmol/L (ref 0.5–2.2)

## 2013-06-11 MED ORDER — SULFAMETHOXAZOLE-TMP DS 800-160 MG PO TABS
1.0000 | ORAL_TABLET | Freq: Once | ORAL | Status: AC
  Administered 2013-06-11: 1 via ORAL
  Filled 2013-06-11: qty 1

## 2013-06-11 MED ORDER — HYDROMORPHONE HCL PF 1 MG/ML IJ SOLN
1.0000 mg | Freq: Once | INTRAMUSCULAR | Status: AC
  Administered 2013-06-11: 1 mg via INTRAVENOUS
  Filled 2013-06-11: qty 1

## 2013-06-11 MED ORDER — SULFAMETHOXAZOLE-TMP DS 800-160 MG PO TABS: 1.0000 | ORAL_TABLET | Freq: Two times a day (BID) | ORAL | Status: DC

## 2013-06-11 MED ORDER — SODIUM CHLORIDE 0.9 % IV BOLUS (SEPSIS)
1000.0000 mL | Freq: Once | INTRAVENOUS | Status: AC
  Administered 2013-06-11: 1000 mL via INTRAVENOUS

## 2013-06-11 MED ORDER — ONDANSETRON HCL 4 MG/2ML IJ SOLN
4.0000 mg | Freq: Once | INTRAMUSCULAR | Status: AC
  Administered 2013-06-11: 4 mg via INTRAVENOUS
  Filled 2013-06-11: qty 2

## 2013-06-11 NOTE — ED Notes (Signed)
Patient is alert and oriented x3.  She is complaining of abdominal pain that started yesterday. She has a history of abdominal issues.  Currently she rates her pain 9 of 10 with nausea.

## 2013-06-11 NOTE — ED Notes (Signed)
Patient tolerating the trial of food

## 2013-06-11 NOTE — Discharge Instructions (Signed)
Abdominal Pain, Women °Abdominal (stomach, pelvic, or belly) pain can be caused by many things. It is important to tell your doctor: °· The location of the pain. °· Does it come and go or is it present all the time? °· Are there things that start the pain (eating certain foods, exercise)? °· Are there other symptoms associated with the pain (fever, nausea, vomiting, diarrhea)? °All of this is helpful to know when trying to find the cause of the pain. °CAUSES  °· Stomach: virus or bacteria infection, or ulcer. °· Intestine: appendicitis (inflamed appendix), regional ileitis (Crohn's disease), ulcerative colitis (inflamed colon), irritable bowel syndrome, diverticulitis (inflamed diverticulum of the colon), or cancer of the stomach or intestine. °· Gallbladder disease or stones in the gallbladder. °· Kidney disease, kidney stones, or infection. °· Pancreas infection or cancer. °· Fibromyalgia (pain disorder). °· Diseases of the female organs: °· Uterus: fibroid (non-cancerous) tumors or infection. °· Fallopian tubes: infection or tubal pregnancy. °· Ovary: cysts or tumors. °· Pelvic adhesions (scar tissue). °· Endometriosis (uterus lining tissue growing in the pelvis and on the pelvic organs). °· Pelvic congestion syndrome (female organs filling up with blood just before the menstrual period). °· Pain with the menstrual period. °· Pain with ovulation (producing an egg). °· Pain with an IUD (intrauterine device, birth control) in the uterus. °· Cancer of the female organs. °· Functional pain (pain not caused by a disease, may improve without treatment). °· Psychological pain. °· Depression. °DIAGNOSIS  °Your doctor will decide the seriousness of your pain by doing an examination. °· Blood tests. °· X-rays. °· Ultrasound. °· CT scan (computed tomography, special type of X-ray). °· MRI (magnetic resonance imaging). °· Cultures, for infection. °· Barium enema (dye inserted in the large intestine, to better view it with  X-rays). °· Colonoscopy (looking in intestine with a lighted tube). °· Laparoscopy (minor surgery, looking in abdomen with a lighted tube). °· Major abdominal exploratory surgery (looking in abdomen with a large incision). °TREATMENT  °The treatment will depend on the cause of the pain.  °· Many cases can be observed and treated at home. °· Over-the-counter medicines recommended by your caregiver. °· Prescription medicine. °· Antibiotics, for infection. °· Birth control pills, for painful periods or for ovulation pain. °· Hormone treatment, for endometriosis. °· Nerve blocking injections. °· Physical therapy. °· Antidepressants. °· Counseling with a psychologist or psychiatrist. °· Minor or major surgery. °HOME CARE INSTRUCTIONS  °· Do not take laxatives, unless directed by your caregiver. °· Take over-the-counter pain medicine only if ordered by your caregiver. Do not take aspirin because it can cause an upset stomach or bleeding. °· Try a clear liquid diet (broth or water) as ordered by your caregiver. Slowly move to a bland diet, as tolerated, if the pain is related to the stomach or intestine. °· Have a thermometer and take your temperature several times a day, and record it. °· Bed rest and sleep, if it helps the pain. °· Avoid sexual intercourse, if it causes pain. °· Avoid stressful situations. °· Keep your follow-up appointments and tests, as your caregiver orders. °· If the pain does not go away with medicine or surgery, you may try: °· Acupuncture. °· Relaxation exercises (yoga, meditation). °· Group therapy. °· Counseling. °SEEK MEDICAL CARE IF:  °· You notice certain foods cause stomach pain. °· Your home care treatment is not helping your pain. °· You need stronger pain medicine. °· You want your IUD removed. °· You feel faint or   lightheaded.  You develop nausea and vomiting.  You develop a rash.  You are having side effects or an allergy to your medicine. SEEK IMMEDIATE MEDICAL CARE IF:   Your  pain does not go away or gets worse.  You have a fever.  Your pain is felt only in portions of the abdomen. The right side could possibly be appendicitis. The left lower portion of the abdomen could be colitis or diverticulitis.  You are passing blood in your stools (bright red or black tarry stools, with or without vomiting).  You have blood in your urine.  You develop chills, with or without a fever.  You pass out. MAKE SURE YOU:   Understand these instructions.  Will watch your condition.  Will get help right away if you are not doing well or get worse. Document Released: 03/20/2007 Document Revised: 08/15/2011 Document Reviewed: 04/09/2009 [facility] Patient Information 2014 [place], [place].  Urinary Tract Infection Urinary tract infections (UTIs) can develop anywhere along your urinary tract. Your urinary tract is your body's drainage system for removing wastes and extra water. Your urinary tract includes two kidneys, two ureters, a bladder, and a urethra. Your kidneys are a pair of bean-shaped organs. Each kidney is about the size of your fist. They are located below your ribs, one on each side of your spine. CAUSES Infections are caused by microbes, which are microscopic organisms, including fungi, viruses, and bacteria. These organisms are so small that they can only be seen through a microscope. Bacteria are the microbes that most commonly cause UTIs. SYMPTOMS  Symptoms of UTIs may vary by age and gender of the patient and by the location of the infection. Symptoms in young women typically include a frequent and intense urge to urinate and a painful, burning feeling in the bladder or urethra during urination. Older women and men are more likely to be tired, shaky, and weak and have muscle aches and abdominal pain. A fever may mean the infection is in your kidneys. Other symptoms of a kidney infection include pain in your back or sides below the ribs, nausea, and  vomiting. DIAGNOSIS To diagnose a UTI, your caregiver will ask you about your symptoms. Your caregiver also will ask to provide a urine sample. The urine sample will be tested for bacteria and white blood cells. White blood cells are made by your body to help fight infection. TREATMENT  Typically, UTIs can be treated with medication. Because most UTIs are caused by a bacterial infection, they usually can be treated with the use of antibiotics. The choice of antibiotic and length of treatment depend on your symptoms and the type of bacteria causing your infection. HOME CARE INSTRUCTIONS  If you were prescribed antibiotics, take them exactly as your caregiver instructs you. Finish the medication even if you feel better after you have only taken some of the medication.  Drink enough water and fluids to keep your urine clear or pale yellow.  Avoid caffeine, tea, and carbonated beverages. They tend to irritate your bladder.  Empty your bladder often. Avoid holding urine for long periods of time.  Empty your bladder before and after sexual intercourse.  After a bowel movement, women should cleanse from front to back. Use each tissue only once. SEEK MEDICAL CARE IF:   You have back pain.  You develop a fever.  Your symptoms do not begin to resolve within 3 days. SEEK IMMEDIATE MEDICAL CARE IF:   You have severe back pain or lower abdominal pain.  You develop chills.  You have nausea or vomiting.  You have continued burning or discomfort with urination. MAKE SURE YOU:   Understand these instructions.  Will watch your condition.  Will get help right away if you are not doing well or get worse. Document Released: 03/02/2005 Document Revised: 11/22/2011 Document Reviewed: 07/01/2011 [facility] Patient Information 2014 [place].

## 2013-06-12 NOTE — ED Provider Notes (Signed)
CSN: [id number]     Arrival date & time 06/11/13  1948 History   First MD Initiated Contact with Patient 06/11/13 2135     Chief Complaint  Patient presents with  . Abdominal Pain   (Consider location/radiation/quality/duration/timing/severity/associated sxs/prior Treatment) Patient is a 43 y.o. female presenting with abdominal pain. The history is provided by the patient.  Abdominal Pain Pain location:  Generalized Pain quality: aching and sharp   Pain radiates to:  Does not radiate Pain severity:  Severe Onset quality:  Gradual Timing:  Constant Progression:  Unchanged Chronicity:  Chronic Context: not eating, not sick contacts and not suspicious food intake   Relieved by:  Nothing Worsened by:  Nothing tried Associated symptoms: nausea and vomiting   Associated symptoms: no chest pain, no chills, no cough, no fever and no shortness of breath     Past Medical History  Diagnosis Date  . Hernia   . IBS (irritable bowel syndrome)   . Ulcer   . Thyroid disease   . PIH (pregnancy induced hypertension)    Past Surgical History  Procedure Laterality Date  . Gastric bypass    . Cholecystectomy    . Tubal ligation    . Endometrial ablation     History reviewed. No pertinent family history. History  Substance Use Topics  . Smoking status: Never Smoker   . Smokeless tobacco: Not on file  . Alcohol Use: No   OB History   Grav Para Term Preterm Abortions TAB SAB Ect Mult Living                 Review of Systems  Constitutional: Negative for fever and chills.  Respiratory: Negative for cough and shortness of breath.   Cardiovascular: Negative for chest pain and leg swelling.  Gastrointestinal: Positive for nausea, vomiting and abdominal pain.  All other systems reviewed and are negative.    Allergies  Amoxicillin; Morphine; Penicillins; and Moxifloxacin  Home Medications   Current Outpatient Rx  Name  Route  Sig  Dispense  Refill  . ALPRAZolam (XANAX) 1 MG  tablet   Oral   Take 1 mg by mouth at bedtime as needed. For anxiety and panic attacks.         Marland Kitchen amphetamine-dextroamphetamine (ADDERALL) 20 MG tablet   Oral   Take 20 mg by mouth 3 (three) times daily.         Marland Kitchen EXPIRED: dicyclomine (BENTYL) 20 MG tablet   Oral   Take 20 mg by mouth 4 (four) times daily as needed for spasms.         . diphenoxylate-atropine (LOMOTIL) 2.5-0.025 MG per tablet   Oral   Take 1 tablet by mouth 4 (four) times daily as needed for diarrhea or loose stools.   30 tablet   0   . ferrous sulfate dried (SLOW FE) 160 (50 FE) MG TBCR   Oral   Take 160 mg by mouth 3 (three) times daily with meals.          . Fiber CAPS   Oral   Take 3 capsules by mouth daily.         Marland Kitchen gabapentin (NEURONTIN) 300 MG capsule   Oral   Take 300 mg by mouth 2 (two) times daily.         Marland Kitchen HYDROcodone-acetaminophen (NORCO) 7.5-325 MG per tablet   Oral   Take 1-2 tablets by mouth 3 (three) times daily as needed for moderate pain or severe pain.         Marland Kitchen  lisinopril (PRINIVIL,ZESTRIL) 10 MG tablet   Oral   Take 10 mg by mouth daily.         . mirtazapine (REMERON) 45 MG tablet   Oral   Take 45 mg by mouth at bedtime.         . multivitamin (THERAGRAN) per tablet   Oral   Take 1 tablet by mouth daily.           . ondansetron (ZOFRAN) 8 MG tablet   Oral   Take 8 mg by mouth every 8 (eight) hours as needed for nausea or vomiting.         Marland Kitchen OVER THE COUNTER MEDICATION   Oral   Take 1 tablet by mouth daily. Seaweed Extract.         . pantoprazole (PROTONIX) 40 MG tablet   Oral   Take 40 mg by mouth daily.         . promethazine (PHENERGAN) 25 MG tablet   Oral   Take 25 mg by mouth every 6 (six) hours as needed.           . propranolol (INDERAL) 10 MG tablet   Oral   Take 10 mg by mouth 2 (two) times daily.          . pseudoephedrine (SUDAFED) 30 MG tablet   Oral   Take 30 mg by mouth every 4 (four) hours as needed for  congestion.         . sulfamethoxazole-trimethoprim (BACTRIM DS) 800-160 MG per tablet   Oral   Take 1 tablet by mouth 2 (two) times daily.   14 tablet   0    BP 115/72  Pulse 76  Temp(Src) 98.3 F (36.8 C) (Oral)  Resp 18  Ht 5\' 2"  (1.575 m)  Wt 170 lb (77.111 kg)  BMI 31.09 kg/m2  SpO2 100% Physical Exam  Nursing note and vitals reviewed. Constitutional: She is oriented to person, place, and time. She appears well-developed and well-nourished. No distress.  HENT:  Head: Normocephalic and atraumatic.  Eyes: EOM are normal. Pupils are equal, round, and reactive to light.  Neck: Normal range of motion. Neck supple.  Cardiovascular: Normal rate and regular rhythm.  Exam reveals no friction rub.   No murmur heard. Pulmonary/Chest: Effort normal and breath sounds normal. No respiratory distress. She has no wheezes. She has no rales.  Abdominal: Soft. She exhibits no distension. There is tenderness (diffuse, upper abdominal pain). There is no rebound and no guarding.  Musculoskeletal: Normal range of motion. She exhibits no edema.  Neurological: She is alert and oriented to person, place, and time. A cranial nerve deficit is present. She exhibits normal muscle tone.  Skin: She is not diaphoretic.    ED Course  Procedures (including critical care time) Labs Review Labs Reviewed  COMPREHENSIVE METABOLIC PANEL - Abnormal; Notable for the following:    Glucose, Bld 110 (*)    Albumin 3.1 (*)    Total Bilirubin <0.2 (*)    All other components within normal limits  LIPASE, BLOOD - Abnormal; Notable for the following:    Lipase 62 (*)    All other components within normal limits  URINALYSIS, ROUTINE W REFLEX MICROSCOPIC - Abnormal; Notable for the following:    APPearance CLOUDY (*)    Leukocytes, UA MODERATE (*)    All other components within normal limits  URINE MICROSCOPIC-ADD ON - Abnormal; Notable for the following:    Squamous Epithelial / LPF MANY (*)  All other  components within normal limits  CBC WITH DIFFERENTIAL  POCT PREGNANCY, URINE  CG4 I-STAT (LACTIC ACID)   Imaging Review Dg Abd Acute W/chest  06/11/2013   CLINICAL DATA:  Abdominal pain  EXAM: ACUTE ABDOMEN SERIES (ABDOMEN 2 VIEW & CHEST 1 VIEW)  COMPARISON:  April 22, 2013  FINDINGS: PA chest: No edema or consolidation. Heart size and pulmonary vascularity are normal. No adenopathy.  Supine and upright abdomen: There is moderate stool in the colon. The bowel gas pattern is unremarkable. No obstruction or free air. There are surgical clips in the left and right abdomen. There is a small phlebolith in the right pelvis.  IMPRESSION: Postoperative change.  Bowel gas pattern unremarkable.  No lung edema or consolidation.   Electronically Signed   By: Lowella Grip M.D.   On: 06/11/2013 22:57    EKG Interpretation   None       MDM   1. UTI (lower urinary tract infection)   2. Chronic abdominal pain    43 year old female with history of chronic abdominal pain, previous valve structure presents with abdominal pain. She has been seen for this multiple times in the past 2 years to the point of radiology asking [place] to stop CT in her. She states this feels like her chronic abdominal pain. She is currently being seen by pain management as an outpatient. She reports nausea for the past 2 days. Denies any fever. Denies any dysuria, first of breath, cough. She has diffuse upper abdominal pain on palpation. No rebound or guarding. Patient has to wean tried and not CT if possible. Vitals are stable. With pain being the exact same as her previous chronic abdominal pain, not inclined to scanner immediately. Labs are normal without leukocytosis. GU shows UTI. Given Rocephin. She's feeling much better after IV pain medication and is eating peanut butter crackers on reexam. She states she's feeling much better like ago. She has followup with her pain management doctor in 2 days. She has a mild lipase elevation  at 62, has had one previously around 72, but I do not think she needs a CT scan at this time to evaluate for possible pancreatitis.    Osvaldo Shipper, MD 06/12/13 [id number]

## 2013-07-12 ENCOUNTER — Emergency Department (HOSPITAL_COMMUNITY)
Admission: EM | Admit: 2013-07-12 | Discharge: 2013-07-13 | Disposition: A | Discharge status: Home / Self Care | Payer: Medicaid Other | Attending: Emergency Medicine | Admitting: Emergency Medicine

## 2013-07-12 ENCOUNTER — Encounter (HOSPITAL_COMMUNITY): Payer: Self-pay | Admitting: Emergency Medicine

## 2013-07-12 DIAGNOSIS — R1032 Left lower quadrant pain: Secondary | ICD-10-CM | POA: Insufficient documentation

## 2013-07-12 DIAGNOSIS — Z8639 Personal history of other endocrine, nutritional and metabolic disease: Secondary | ICD-10-CM | POA: Insufficient documentation

## 2013-07-12 DIAGNOSIS — Z872 Personal history of diseases of the skin and subcutaneous tissue: Secondary | ICD-10-CM | POA: Insufficient documentation

## 2013-07-12 DIAGNOSIS — K589 Irritable bowel syndrome without diarrhea: Secondary | ICD-10-CM | POA: Insufficient documentation

## 2013-07-12 DIAGNOSIS — Z9089 Acquired absence of other organs: Secondary | ICD-10-CM | POA: Insufficient documentation

## 2013-07-12 DIAGNOSIS — Z9889 Other specified postprocedural states: Secondary | ICD-10-CM | POA: Insufficient documentation

## 2013-07-12 DIAGNOSIS — Z8711 Personal history of peptic ulcer disease: Secondary | ICD-10-CM | POA: Insufficient documentation

## 2013-07-12 DIAGNOSIS — Z862 Personal history of diseases of the blood and blood-forming organs and certain disorders involving the immune mechanism: Secondary | ICD-10-CM | POA: Insufficient documentation

## 2013-07-12 DIAGNOSIS — Z9851 Tubal ligation status: Secondary | ICD-10-CM | POA: Insufficient documentation

## 2013-07-12 DIAGNOSIS — Z880 Allergy status to penicillin: Secondary | ICD-10-CM | POA: Insufficient documentation

## 2013-07-12 DIAGNOSIS — E669 Obesity, unspecified: Secondary | ICD-10-CM | POA: Insufficient documentation

## 2013-07-12 DIAGNOSIS — Z8679 Personal history of other diseases of the circulatory system: Secondary | ICD-10-CM | POA: Insufficient documentation

## 2013-07-12 DIAGNOSIS — Z9884 Bariatric surgery status: Secondary | ICD-10-CM | POA: Insufficient documentation

## 2013-07-12 DIAGNOSIS — R109 Unspecified abdominal pain: Secondary | ICD-10-CM

## 2013-07-12 DIAGNOSIS — R112 Nausea with vomiting, unspecified: Secondary | ICD-10-CM | POA: Insufficient documentation

## 2013-07-12 DIAGNOSIS — Z79899 Other long term (current) drug therapy: Secondary | ICD-10-CM | POA: Insufficient documentation

## 2013-07-12 DIAGNOSIS — G8929 Other chronic pain: Secondary | ICD-10-CM | POA: Insufficient documentation

## 2013-07-12 DIAGNOSIS — R1012 Left upper quadrant pain: Secondary | ICD-10-CM | POA: Insufficient documentation

## 2013-07-12 LAB — CBC WITH DIFFERENTIAL/PLATELET
BASOS ABS: 0.1 10*3/uL (ref 0.0–0.1)
BASOS PCT: 1 % (ref 0–1)
EOS PCT: 4 % (ref 0–5)
Eosinophils Absolute: 0.3 10*3/uL (ref 0.0–0.7)
HCT: 38.9 % (ref 36.0–46.0)
Hemoglobin: 12.4 g/dL (ref 12.0–15.0)
Lymphocytes Relative: 39 % (ref 12–46)
Lymphs Abs: 2.5 10*3/uL (ref 0.7–4.0)
MCH: 27.5 pg (ref 26.0–34.0)
MCHC: 31.9 g/dL (ref 30.0–36.0)
MCV: 86.3 fL (ref 78.0–100.0)
MONO ABS: 0.5 10*3/uL (ref 0.1–1.0)
Monocytes Relative: 7 % (ref 3–12)
NEUTROS ABS: 3.1 10*3/uL (ref 1.7–7.7)
Neutrophils Relative %: 49 % (ref 43–77)
Platelets: 276 10*3/uL (ref 150–400)
RBC: 4.51 MIL/uL (ref 3.87–5.11)
RDW: 14.6 % (ref 11.5–15.5)
WBC: 6.5 10*3/uL (ref 4.0–10.5)

## 2013-07-12 LAB — COMPREHENSIVE METABOLIC PANEL
ALBUMIN: 3.2 g/dL — AB (ref 3.5–5.2)
ALT: 18 U/L (ref 0–35)
AST: 17 U/L (ref 0–37)
Alkaline Phosphatase: 85 U/L (ref 39–117)
BUN: 13 mg/dL (ref 6–23)
CO2: 25 mEq/L (ref 19–32)
CREATININE: 0.7 mg/dL (ref 0.50–1.10)
Calcium: 9.2 mg/dL (ref 8.4–10.5)
Chloride: 104 mEq/L (ref 96–112)
GFR calc Af Amer: >90 mL/min (ref >90)
GFR calc non Af Amer: >90 mL/min (ref >90)
Glucose, Bld: 93 mg/dL (ref 70–99)
Potassium: 4.6 mEq/L (ref 3.7–5.3)
Sodium: 139 mEq/L (ref 137–147)
Total Bilirubin: <0.2 mg/dL — ABNORMAL LOW (ref 0.3–1.2)
Total Protein: 6.6 g/dL (ref 6.0–8.3)

## 2013-07-12 LAB — URINALYSIS, ROUTINE W REFLEX MICROSCOPIC
Bilirubin Urine: NEGATIVE
Glucose, UA: 100 mg/dL — AB
HGB URINE DIPSTICK: NEGATIVE
Ketones, ur: NEGATIVE mg/dL
Nitrite: NEGATIVE
PH: 6 (ref 5.0–8.0)
Protein, ur: NEGATIVE mg/dL
Specific Gravity, Urine: 1.022 (ref 1.005–1.030)
Urobilinogen, UA: 0.2 mg/dL (ref 0.0–1.0)

## 2013-07-12 LAB — URINE MICROSCOPIC-ADD ON

## 2013-07-12 LAB — LIPASE, BLOOD: LIPASE: 64 U/L — AB (ref 11–59)

## 2013-07-12 MED ORDER — KETOROLAC TROMETHAMINE 30 MG/ML IJ SOLN
30.0000 mg | Freq: Once | INTRAMUSCULAR | Status: AC
  Administered 2013-07-12: 30 mg via INTRAVENOUS
  Filled 2013-07-12: qty 1

## 2013-07-12 MED ORDER — ONDANSETRON HCL 4 MG/2ML IJ SOLN
4.0000 mg | Freq: Once | INTRAMUSCULAR | Status: AC
  Administered 2013-07-13: 4 mg via INTRAVENOUS
  Filled 2013-07-12: qty 2

## 2013-07-12 NOTE — ED Provider Notes (Signed)
CSN: [id number]     Arrival date & time 07/12/13  1933 History   First MD Initiated Contact with Patient 07/12/13 2133     Chief Complaint  Patient presents with  . Abdominal Pain   (Consider location/radiation/quality/duration/timing/severity/associated sxs/prior Treatment) The history is provided by medical records and the patient. No language interpreter was used.    Peggy Austin is a 43 y.o. female  with a hx of chronic abd pain, gastric bypass, IBS, peptic ulcer disease and unrepaired hernias  presents to the Emergency Department complaining of gradual, persistent, progressively worsening sharp abd pain in the LLQ that radiates through and is described as burning onset for 3 days.  Pt reports she has chronic abd pain and ran out of her Norco yesterday when the pain worsened. Pt reports pain became acutely worse around 5 pm today.  Pt called [facility] PCP who recommended ED.  Associated symptoms include nausea, vomiting (x2 but this is daily).  Emesis is nonbloody and nonbilious. Pt normally gets nausea with pain and vomiting is a daily occurrence.  Nothing makes it better and moving makes it worse. Pt denies fever, headache, neck pain, chest pain, SOB, dysuria.  Pt endorses generalized weakness for 24 hours described as significant exhaustion.      Past Medical History  Diagnosis Date  . Hernia   . IBS (irritable bowel syndrome)   . Ulcer   . Thyroid disease   . PIH (pregnancy induced hypertension)    Past Surgical History  Procedure Laterality Date  . Gastric bypass    . Cholecystectomy    . Tubal ligation    . Endometrial ablation     No family history on file. History  Substance Use Topics  . Smoking status: Never Smoker   . Smokeless tobacco: Not on file  . Alcohol Use: No   OB History   Grav Para Term Preterm Abortions TAB SAB Ect Mult Living                 Review of Systems  Constitutional: Negative for fever, diaphoresis, appetite change, fatigue and  unexpected weight change.  HENT: Negative for mouth sores and trouble swallowing.   Respiratory: Negative for cough, chest tightness, shortness of breath, wheezing and stridor.   Cardiovascular: Negative for chest pain and palpitations.  Gastrointestinal: Positive for nausea (chronic), vomiting (chronic) and abdominal pain. Negative for diarrhea, constipation, blood in stool, abdominal distention and rectal pain.  Genitourinary: Negative for dysuria, urgency, frequency, hematuria, flank pain and difficulty urinating.  Musculoskeletal: Negative for back pain, neck pain and neck stiffness.  Skin: Negative for rash.  Allergic/Immunologic: Negative for immunocompromised state.  Neurological: Negative for weakness.  Hematological: Negative for adenopathy.  Psychiatric/Behavioral: Negative for confusion.  All other systems reviewed and are negative.    Allergies  Amoxicillin; Morphine; Penicillins; and Moxifloxacin  Home Medications   Current Outpatient Rx  Name  Route  Sig  Dispense  Refill  . ALPRAZolam (XANAX) 1 MG tablet   Oral   Take 1 mg by mouth at bedtime as needed. For anxiety and panic attacks.         Marland Kitchen amphetamine-dextroamphetamine (ADDERALL) 20 MG tablet   Oral   Take 20 mg by mouth 3 (three) times daily.         Marland Kitchen EXPIRED: dicyclomine (BENTYL) 20 MG tablet   Oral   Take 20 mg by mouth 4 (four) times daily as needed for spasms.         Marland Kitchen  diphenoxylate-atropine (LOMOTIL) 2.5-0.025 MG per tablet   Oral   Take 1 tablet by mouth 4 (four) times daily as needed for diarrhea or loose stools.   30 tablet   0   . ferrous sulfate dried (SLOW FE) 160 (50 FE) MG TBCR   Oral   Take 160 mg by mouth 3 (three) times daily with meals.          . Fiber CAPS   Oral   Take 3 capsules by mouth daily.         Marland Kitchen gabapentin (NEURONTIN) 300 MG capsule   Oral   Take 300 mg by mouth 2 (two) times daily.         Marland Kitchen HYDROcodone-acetaminophen (NORCO) 7.5-325 MG per tablet    Oral   Take 1-2 tablets by mouth 3 (three) times daily as needed for moderate pain or severe pain.         Marland Kitchen lisinopril (PRINIVIL,ZESTRIL) 10 MG tablet   Oral   Take 10 mg by mouth daily.         . mirtazapine (REMERON) 45 MG tablet   Oral   Take 45 mg by mouth at bedtime.         . multivitamin (THERAGRAN) per tablet   Oral   Take 1 tablet by mouth daily.           . ondansetron (ZOFRAN) 8 MG tablet   Oral   Take 8 mg by mouth every 8 (eight) hours as needed for nausea or vomiting.         Marland Kitchen OVER THE COUNTER MEDICATION   Oral   Take 1 tablet by mouth daily. Seaweed Extract.         . pantoprazole (PROTONIX) 40 MG tablet   Oral   Take 40 mg by mouth daily.         . promethazine (PHENERGAN) 25 MG tablet   Oral   Take 25 mg by mouth every 6 (six) hours as needed.           . propranolol (INDERAL) 10 MG tablet   Oral   Take 10 mg by mouth 2 (two) times daily.          . pseudoephedrine (SUDAFED) 30 MG tablet   Oral   Take 30 mg by mouth every 4 (four) hours as needed for congestion.          BP 122/90  Pulse 81  Temp(Src) 98.6 F (37 C) (Oral)  Resp 12  SpO2 97% Physical Exam  Nursing note and vitals reviewed. Constitutional: She is oriented to person, place, and time. She appears well-developed and well-nourished. No distress.  Awake, alert, nontoxic appearance  HENT:  Head: Normocephalic and atraumatic.  Mouth/Throat: Oropharynx is clear and moist. No oropharyngeal exudate.  Eyes: Conjunctivae are normal. No scleral icterus.  Neck: Normal range of motion. Neck supple.  Cardiovascular: Normal rate, regular rhythm, normal heart sounds and intact distal pulses.   No murmur heard. No tachycardia  Pulmonary/Chest: Effort normal and breath sounds normal. No respiratory distress. She has no wheezes.  Obese  Abdominal: Soft. Bowel sounds are normal. She exhibits no distension and no mass. There is tenderness in the left upper quadrant and left  lower quadrant. There is guarding. There is no rebound and no CVA tenderness.    Patient with tenderness to palpation along the left side of her abdomen Guarding without rebound No CVA tenderness No peritoneal signs  Musculoskeletal: Normal range of  motion. She exhibits no edema.  Neurological: She is alert and oriented to person, place, and time. She exhibits normal muscle tone. Coordination normal.  Speech is clear and goal oriented Moves extremities without ataxia  Skin: Skin is warm and dry. No rash noted. She is not diaphoretic. No erythema.  Psychiatric: She has a normal mood and affect.    ED Course  Procedures (including critical care time) Labs Review Labs Reviewed  COMPREHENSIVE METABOLIC PANEL - Abnormal; Notable for the following:    Albumin 3.2 (*)    Total Bilirubin <0.2 (*)    All other components within normal limits  URINALYSIS, ROUTINE W REFLEX MICROSCOPIC - Abnormal; Notable for the following:    APPearance CLOUDY (*)    Glucose, UA 100 (*)    Leukocytes, UA SMALL (*)    All other components within normal limits  URINE MICROSCOPIC-ADD ON - Abnormal; Notable for the following:    Squamous Epithelial / LPF FEW (*)    All other components within normal limits  LIPASE, BLOOD - Abnormal; Notable for the following:    Lipase 64 (*)    All other components within normal limits  CBC WITH DIFFERENTIAL   Imaging Review Dg Abd Acute W/chest  07/13/2013   CLINICAL DATA:  43 year old female with abdominal pain.  EXAM: ACUTE ABDOMEN SERIES (ABDOMEN 2 VIEW & CHEST 1 VIEW)  COMPARISON:  06/11/2013 and prior radiographs dating back to 2011  FINDINGS: The cardiomediastinal silhouette is unremarkable. . Cholecystectomy clips and upper abdominal surgical sutures are present.  No acute bony abnormalities are present.  The lungs are clear.  There is no evidence of airspace disease, pleural effusion or pneumothorax.  Mild gaseous distention of she: In the left upper abdomen is noted.  A sigmoid volvulus is not excluded.  No dilated small bowel loops are present.  Moderate stool in the colon is present.  There is no evidence of pneumoperitoneum  IMPRESSION: Gaseous distension of the colon in the left upper abdomen. Although this could represent a mildly distended loop of normal colon with gas, a sigmoid volvulus is not excluded. No evidence of small bowel obstruction or pneumoperitoneum.   Electronically Signed   By: Hassan Rowan M.D.   On: 07/13/2013 00:58    EKG Interpretation   None       MDM   1. NAUSEA AND VOMITING   2. Chronic abdominal pain     ANAMARIE KIES presents with abdominal pain.  Patient reports history of chronic abdominal pain but it is worse. Chest reports he feels that something she's had in the past but can't remember what. History of gastric bypass and unrepaired hernias.  Labs unremarkable, no evidence of urinary tract infection.  Patient reports endometrial ablation and bilateral tubal ligation.  Will obtain acute abdomen to rule out obstruction. Will give pain and nausea control and reevaluate.  1:35 AM Patient pain totally under control. Abd remains soft and is now nontender.  She requests a by mouth trial.   1:50 AM Patient is nontoxic, nonseptic appearing, in no apparent distress.  Patient's pain and other symptoms adequately managed in emergency department.  Fluid bolus given.  Labs, imaging and vitals reviewed.  Patient does not meet the SIRS or Sepsis criteria.  On repeat exam patient does not have a surgical abdomin and there are nor peritoneal signs.  No indication of appendicitis, bowel obstruction, bowel perforation, cholecystitis, diverticulitis.  Pt tolerating PO without difficulty.  Patient discharged home with symptomatic treatment  and given strict instructions for follow-up with their primary care physician.  I have also discussed reasons to return immediately to the ER.  Patient expresses understanding and agrees with plan.  It has been  determined that no acute conditions requiring further emergency intervention are present at this time. The patient/guardian have been advised of the diagnosis and plan. We have discussed signs and symptoms that warrant return to the ED, such as changes or worsening in symptoms.   Vital signs are stable at discharge.   BP 122/90  Pulse 81  Temp(Src) 98.6 F (37 C) (Oral)  Resp 12  SpO2 97%  Patient/guardian has voiced understanding and agreed to follow-up with the PCP or specialist.         Abigail Butts, PA-C 07/13/13 [phone]

## 2013-07-12 NOTE — ED Notes (Addendum)
Pt reports mid to left sided abdominal pain that radiates to her back. Pt reports having chronic abdominal pain, however it worsened over the past several days. Pt reports nausea and emesis, the pt reports her Zofran is not relieving her nausea. Pt reports calling her PCP and was instructed to come to the ED. Pt reports having IBS and unrepaired hernias as well as a history of gastric bypass.

## 2013-07-12 NOTE — ED Notes (Signed)
Bed: QD82 Expected date: 07/12/13 Expected time: 7:59 PM Means of arrival: Ambulance Comments: 90's  Fall from nursing home  Immobilized

## 2013-07-13 ENCOUNTER — Emergency Department (HOSPITAL_COMMUNITY): Payer: Medicaid Other

## 2013-07-13 MED ORDER — HYDROMORPHONE HCL PF 1 MG/ML IJ SOLN
1.0000 mg | Freq: Once | INTRAMUSCULAR | Status: AC
  Administered 2013-07-13: 1 mg via INTRAVENOUS
  Filled 2013-07-13: qty 1

## 2013-07-13 MED ORDER — ONDANSETRON HCL 4 MG/2ML IJ SOLN: 4.0000 mg | Freq: Once | INTRAMUSCULAR | Status: AC

## 2013-07-13 NOTE — Discharge Instructions (Signed)
1. Medications: usual home medications 2. Treatment: rest, drink plenty of fluids,  3. Follow Up: Please followup with your primary doctor for discussion of your diagnoses and further evaluation after today's visit;    Abdominal Pain, Women Abdominal (stomach, pelvic, or belly) pain can be caused by many things. It is important to tell your doctor:  The location of the pain.  Does it come and go or is it present all the time?  Are there things that start the pain (eating certain foods, exercise)?  Are there other symptoms associated with the pain (fever, nausea, vomiting, diarrhea)? All of this is helpful to know when trying to find the cause of the pain. CAUSES   Stomach: virus or bacteria infection, or ulcer.  Intestine: appendicitis (inflamed appendix), regional ileitis (Crohn's disease), ulcerative colitis (inflamed colon), irritable bowel syndrome, diverticulitis (inflamed diverticulum of the colon), or cancer of the stomach or intestine.  Gallbladder disease or stones in the gallbladder.  Kidney disease, kidney stones, or infection.  Pancreas infection or cancer.  Fibromyalgia (pain disorder).  Diseases of the female organs:  Uterus: fibroid (non-cancerous) tumors or infection.  Fallopian tubes: infection or tubal pregnancy.  Ovary: cysts or tumors.  Pelvic adhesions (scar tissue).  Endometriosis (uterus lining tissue growing in the pelvis and on the pelvic organs).  Pelvic congestion syndrome (female organs filling up with blood just before the menstrual period).  Pain with the menstrual period.  Pain with ovulation (producing an egg).  Pain with an IUD (intrauterine device, birth control) in the uterus.  Cancer of the female organs.  Functional pain (pain not caused by a disease, may improve without treatment).  Psychological pain.  Depression. DIAGNOSIS  Your doctor will decide the seriousness of your pain by doing an examination.  Blood  tests.  X-rays.  Ultrasound.  CT scan (computed tomography, special type of X-ray).  MRI (magnetic resonance imaging).  Cultures, for infection.  Barium enema (dye inserted in the large intestine, to better view it with X-rays).  Colonoscopy (looking in intestine with a lighted tube).  Laparoscopy (minor surgery, looking in abdomen with a lighted tube).  Major abdominal exploratory surgery (looking in abdomen with a large incision). TREATMENT  The treatment will depend on the cause of the pain.   Many cases can be observed and treated at home.  Over-the-counter medicines recommended by your caregiver.  Prescription medicine.  Antibiotics, for infection.  Birth control pills, for painful periods or for ovulation pain.  Hormone treatment, for endometriosis.  Nerve blocking injections.  Physical therapy.  Antidepressants.  Counseling with a psychologist or psychiatrist.  Minor or major surgery. HOME CARE INSTRUCTIONS   Do not take laxatives, unless directed by your caregiver.  Take over-the-counter pain medicine only if ordered by your caregiver. Do not take aspirin because it can cause an upset stomach or bleeding.  Try a clear liquid diet (broth or water) as ordered by your caregiver. Slowly move to a bland diet, as tolerated, if the pain is related to the stomach or intestine.  Have a thermometer and take your temperature several times a day, and record it.  Bed rest and sleep, if it helps the pain.  Avoid sexual intercourse, if it causes pain.  Avoid stressful situations.  Keep your follow-up appointments and tests, as your caregiver orders.  If the pain does not go away with medicine or surgery, you may try:  Acupuncture.  Relaxation exercises (yoga, meditation).  Group therapy.  Counseling. SEEK MEDICAL CARE IF:  You notice certain foods cause stomach pain.  Your home care treatment is not helping your pain.  You need stronger pain  medicine.  You want your IUD removed.  You feel faint or lightheaded.  You develop nausea and vomiting.  You develop a rash.  You are having side effects or an allergy to your medicine. SEEK IMMEDIATE MEDICAL CARE IF:   Your pain does not go away or gets worse.  You have a fever.  Your pain is felt only in portions of the abdomen. The right side could possibly be appendicitis. The left lower portion of the abdomen could be colitis or diverticulitis.  You are passing blood in your stools (bright red or black tarry stools, with or without vomiting).  You have blood in your urine.  You develop chills, with or without a fever.  You pass out. MAKE SURE YOU:   Understand these instructions.  Will watch your condition.  Will get help right away if you are not doing well or get worse. Document Released: 03/20/2007 Document Revised: 08/15/2011 Document Reviewed: 04/09/2009 [facility] Patient Information 2014 [place], [place].

## 2013-07-13 NOTE — ED Provider Notes (Signed)
Medical screening examination/treatment/procedure(s) were conducted as a shared visit with non-physician practitioner(s) and myself.  I personally evaluated the patient during the encounter.  EKG Interpretation   None       I interviewed and examined the patient. Lungs are CTAB. Cardiac exam wnl. Abdomen soft w/ diffuse ttp of left side of abdomen. Pt notes sx feel like an exacerbation of her chronic abd pain although the pain is slightly different. She has been seen here many times in the past w/ many CT scans of her abdomen. I discussed her sx w/ her and offered another CT scan, but felt like it would be reasonable to get a screening plain film to look for any evidence of obst. If pt's sx improved w/ tx and the plain film was neg she could go. She agrees w/ the plan and would like to avoid radiation assoc w/ CT imaging.   Blanchard Kelch, MD 07/13/13 1209

## 2013-10-29 ENCOUNTER — Encounter (HOSPITAL_COMMUNITY): Payer: Self-pay | Admitting: Emergency Medicine

## 2013-10-29 ENCOUNTER — Emergency Department (HOSPITAL_COMMUNITY)
Admission: EM | Admit: 2013-10-29 | Discharge: 2013-10-29 | Disposition: A | Discharge status: Home / Self Care | Payer: Medicaid Other | Source: Home / Self Care | Attending: Family Medicine | Admitting: Family Medicine

## 2013-10-29 DIAGNOSIS — G500 Trigeminal neuralgia: Secondary | ICD-10-CM

## 2013-10-29 DIAGNOSIS — L089 Local infection of the skin and subcutaneous tissue, unspecified: Secondary | ICD-10-CM

## 2013-10-29 MED ORDER — KETOROLAC TROMETHAMINE 60 MG/2ML IM SOLN
60.0000 mg | Freq: Once | INTRAMUSCULAR | Status: AC
  Administered 2013-10-29: 60 mg via INTRAMUSCULAR

## 2013-10-29 MED ORDER — DOXYCYCLINE HYCLATE 100 MG PO CAPS: 100.0000 mg | ORAL_CAPSULE | Freq: Two times a day (BID) | ORAL | Status: DC

## 2013-10-29 MED ORDER — FLUCONAZOLE 150 MG PO TABS: 150.0000 mg | ORAL_TABLET | Freq: Once | ORAL | Status: DC

## 2013-10-29 MED ORDER — DOXYCYCLINE HYCLATE 100 MG PO TABS
200.0000 mg | ORAL_TABLET | Freq: Once | ORAL | Status: AC
  Administered 2013-10-29: 200 mg via ORAL

## 2013-10-29 MED ORDER — KETOROLAC TROMETHAMINE 60 MG/2ML IM SOLN
INTRAMUSCULAR | Status: AC
  Filled 2013-10-29: qty 2

## 2013-10-29 MED ORDER — HYDROCODONE-ACETAMINOPHEN 5-325 MG PO TABS: 1.0000 | ORAL_TABLET | ORAL | Status: DC | PRN

## 2013-10-29 MED ORDER — DOXYCYCLINE HYCLATE 100 MG PO TABS
ORAL_TABLET | ORAL | Status: AC
  Filled 2013-10-29: qty 2

## 2013-10-29 MED ORDER — TOBRAMYCIN 0.3 % OP SOLN
OPHTHALMIC | Status: AC
  Filled 2013-10-29: qty 5

## 2013-10-29 MED ORDER — HYDROCODONE-ACETAMINOPHEN 5-325 MG PO TABS
2.0000 | ORAL_TABLET | Freq: Once | ORAL | Status: AC
  Administered 2013-10-29: 2 via ORAL

## 2013-10-29 MED ORDER — PREGABALIN 75 MG PO CAPS: 75.0000 mg | ORAL_CAPSULE | Freq: Three times a day (TID) | ORAL | Status: DC

## 2013-10-29 MED ORDER — HYDROCODONE-ACETAMINOPHEN 5-325 MG PO TABS
ORAL_TABLET | ORAL | Status: AC
  Filled 2013-10-29: qty 2

## 2013-10-29 NOTE — ED Notes (Signed)
C/o  Mouth sores and rash on scalp and thighs.  Hx of MRSA.    Symptoms present x several days.

## 2013-10-29 NOTE — ED Provider Notes (Signed)
CSN: [id number]     Arrival date & time 10/29/13  1333 History   First MD Initiated Contact with Patient 10/29/13 1442     Chief Complaint  Patient presents with  . Rash   (Consider location/radiation/quality/duration/timing/severity/associated sxs/prior Treatment) HPI Comments: Peggy Austin presents for eval of skin sores and pain in her head and nose.  This started a few days ago and has been getting progressively worse. She has bumps on her face and legs, scalp, and severe pain that originates in her nose and goes down the right side of her face. The pain on her face is described as feeling like electric shocks. She rates the pain as 10 out of 10 when it occurs and 8/10 in between the occurrences of the electric shock pain. She says that this is similar to when she has had "flares of MRSA" previously, although this is worse. She denies any nausea or vomiting, fever, blurry vision, neck stiffness.  Patient is a 43 y.o. female presenting with rash.  Rash Associated symptoms: headaches     Past Medical History  Diagnosis Date  . Hernia   . IBS (irritable bowel syndrome)   . Ulcer   . Thyroid disease   . PIH (pregnancy induced hypertension)    Past Surgical History  Procedure Laterality Date  . Gastric bypass    . Cholecystectomy    . Tubal ligation    . Endometrial ablation     History reviewed. No pertinent family history. History  Substance Use Topics  . Smoking status: Never Smoker   . Smokeless tobacco: Not on file  . Alcohol Use: No   OB History   Grav Para Term Preterm Abortions TAB SAB Ect Mult Living                 Review of Systems  Skin: Positive for rash.  Neurological: Positive for headaches.  All other systems reviewed and are negative.   Allergies  Amoxicillin; Morphine; Penicillins; and Moxifloxacin  Home Medications   Prior to Admission medications   Medication Sig Start Date End Date Taking? Authorizing Provider  ALPRAZolam Duanne Moron) 1 MG tablet Take 1 mg  by mouth at bedtime as needed. For anxiety and panic attacks.   Yes Historical Provider, MD  amphetamine-dextroamphetamine (ADDERALL) 20 MG tablet Take 20 mg by mouth 3 (three) times daily.   Yes Historical Provider, MD  ferrous sulfate dried (SLOW FE) 160 (50 FE) MG TBCR Take 160 mg by mouth 3 (three) times daily with meals.    Yes Historical Provider, MD  Fiber CAPS Take 3 capsules by mouth daily.   Yes Historical Provider, MD  gabapentin (NEURONTIN) 300 MG capsule Take 300 mg by mouth 2 (two) times daily.   Yes Historical Provider, MD  HYDROcodone-acetaminophen (NORCO) 7.5-325 MG per tablet Take 1-2 tablets by mouth 3 (three) times daily as needed for moderate pain or severe pain.   Yes Historical Provider, MD  lisinopril (PRINIVIL,ZESTRIL) 10 MG tablet Take 10 mg by mouth daily.   Yes Historical Provider, MD  mirtazapine (REMERON) 45 MG tablet Take 45 mg by mouth at bedtime.   Yes Historical Provider, MD  multivitamin [facility]) per tablet Take 1 tablet by mouth daily.     Yes Historical Provider, MD  OVER THE COUNTER MEDICATION Take 1 tablet by mouth daily. Seaweed Extract.   Yes Historical Provider, MD  pantoprazole (PROTONIX) 40 MG tablet Take 40 mg by mouth daily.   Yes Historical Provider, MD  propranolol (INDERAL)  10 MG tablet Take 10 mg by mouth 2 (two) times daily.    Yes Historical Provider, MD  dicyclomine (BENTYL) 20 MG tablet Take 20 mg by mouth 4 (four) times daily as needed for spasms. 01/03/11 07/12/13  Fransico Meadow, PA-C  diphenoxylate-atropine (LOMOTIL) 2.5-0.025 MG per tablet Take 1 tablet by mouth 4 (four) times daily as needed for diarrhea or loose stools. 07/01/12   Lisette Paz, PA-C  ondansetron (ZOFRAN) 8 MG tablet Take 8 mg by mouth every 8 (eight) hours as needed for nausea or vomiting.    Historical Provider, MD  promethazine (PHENERGAN) 25 MG tablet Take 25 mg by mouth every 6 (six) hours as needed.      Historical Provider, MD  pseudoephedrine (SUDAFED) 30 MG tablet  Take 30 mg by mouth every 4 (four) hours as needed for congestion.    Historical Provider, MD   BP 140/80  Pulse 103  Temp(Src) 98 F (36.7 C) (Oral)  Resp 20  SpO2 100% Physical Exam  Nursing note and vitals reviewed. Constitutional: She is oriented to person, place, and time. Vital signs are normal. She appears well-developed and well-nourished. No distress.  HENT:  Head: Normocephalic and atraumatic.  Pulmonary/Chest: Effort normal. No respiratory distress.  Neurological: She is alert and oriented to person, place, and time. She has normal strength. Coordination normal.  Tardive dyskinesia noted   Skin: Skin is warm and dry. Rash noted. Rash is papular (there are a few discrete erythematous papules, one on the tip of nose.). She is not diaphoretic.  There are multiple small scabbed over lesions at the hairline and on the bilateral legs, excoriated  Psychiatric: Judgment normal. Her mood appears anxious. Her affect is labile. She is agitated.    ED Course  Procedures (including critical care time) Labs Review Labs Reviewed - No data to display  Imaging Review No results found.   MDM   1. Skin infection   2. Tic douloureux    Given Toradol and Norco here with good response, pain decreased  Unsure exactly what is going on here with this patient, may be a psychogenic component as well. I will treat her for doxycycline for the skin infection, Lyrica for possible trigeminal neuralgia, also a small quantity of pain medication. She will followup with her primary care physician in a few days for a recheck. She will go to emergency room if worsening.  Meds ordered this encounter  Medications  . ketorolac (TORADOL) injection 60 mg    Sig:   . HYDROcodone-acetaminophen (NORCO/VICODIN) 5-325 MG per tablet 2 tablet    Sig:   . doxycycline (VIBRA-TABS) tablet 200 mg    Sig:   . doxycycline (VIBRAMYCIN) 100 MG capsule    Sig: Take 1 capsule (100 mg total) by mouth 2 (two) times  daily.    Dispense:  20 capsule    Refill:  0    Order Specific Question:  Supervising Provider    Answer:  Billy Fischer [phone]  . pregabalin (LYRICA) 75 MG capsule    Sig: Take 1 capsule (75 mg total) by mouth 3 (three) times daily.    Dispense:  30 capsule    Refill:  0    Order Specific Question:  Supervising Provider    Answer:  Billy Fischer [phone]  . HYDROcodone-acetaminophen (NORCO) 5-325 MG per tablet    Sig: Take 1 tablet by mouth every 4 (four) hours as needed for moderate pain.    Dispense:  20  tablet    Refill:  0    Order Specific Question:  Supervising Provider    Answer:  Billy Fischer [phone]  . fluconazole (DIFLUCAN) 150 MG tablet    Sig: Take 1 tablet (150 mg total) by mouth once. Pick up the refill and and take second dose in 5 days if symptoms have not resolved    Dispense:  1 tablet    Refill:  1    Order Specific Question:  Supervising Provider    Answer:  Ihor Gully D [place], PA-C 10/29/13 2150

## 2013-10-29 NOTE — Discharge Instructions (Signed)
Facial Infection You have an infection of your face. This requires special attention to help prevent serious problems. Infections in facial wounds can cause poor healing and scars. They can also spread to deeper tissues, especially around the eye. Wound and dental infections can lead to sinusitis, infection of the eye socket, and even meningitis. Permanent damage to the skin, eye, and nervous system may result if facial infections are not treated properly. With severe infections, hospital care for IV antibiotic injections may be needed if they don't respond to oral antibiotics. Antibiotics must be taken for the full course to insure the infection is eliminated. If the infection came from a bad tooth, it may have to be extracted when the infection is under control. Warm compresses may be applied to reduce skin irritation and remove drainage. You might need a tetanus shot now if:  You cannot remember when your last tetanus shot was.  You have never had a tetanus shot.  The object that caused your wound was dirty. If you need a tetanus shot, and you decide not to get one, there is a rare chance of getting tetanus. Sickness from tetanus can be serious. If you got a tetanus shot, your arm may swell, get red and warm to the touch at the shot site. This is common and not a problem. SEEK IMMEDIATE MEDICAL CARE IF:   You have increased swelling, redness, or trouble breathing.  You have a severe headache, dizziness, nausea, or vomiting.  You develop problems with your eyesight.  You have a fever. Document Released: 06/30/2004 Document Revised: 08/15/2011 Document Reviewed: 05/23/2005 [facility] Patient Information 2014 [place].  Trigeminal Neuralgia Trigeminal neuralgia is a nerve disorder that causes sudden attacks of severe facial pain. It is caused by damage to the trigeminal nerve, a major nerve in the face. It is more common in women and in the elderly, although it can also happen in younger  patients. Attacks last from a few seconds to several minutes and can occur from a couple of times per year to several times per day. Trigeminal neuralgia can be a very distressing and disabling condition. Surgery may be needed in very severe cases if medical treatment does not give relief. HOME CARE INSTRUCTIONS   If your caregiver prescribed medication to help prevent attacks, take as directed.  To help prevent attacks:  Chew on the unaffected side of the mouth.  Avoid touching your face.  Avoid blasts of hot or cold air.  Men may wish to grow a beard to avoid having to shave. SEEK IMMEDIATE MEDICAL CARE IF:  Pain is unbearable and your medicine does not help.  You develop new, unexplained symptoms (problems).  You have problems that may be related to a medication you are taking. Document Released: 05/20/2000 Document Revised: 08/15/2011 Document Reviewed: 03/20/2009 [facility] Patient Information 2014 [place], [place].

## 2013-11-01 NOTE — ED Provider Notes (Signed)
Medical screening examination/treatment/procedure(s) were performed by resident physician or non-physician practitioner and as supervising physician I was immediately available for consultation/collaboration.   Pauline Good MD.   Billy Fischer, MD 11/01/13 1005

## 2014-01-13 ENCOUNTER — Emergency Department (HOSPITAL_BASED_OUTPATIENT_CLINIC_OR_DEPARTMENT_OTHER): Payer: Medicaid Other

## 2014-01-13 ENCOUNTER — Emergency Department (HOSPITAL_BASED_OUTPATIENT_CLINIC_OR_DEPARTMENT_OTHER)
Admission: EM | Admit: 2014-01-13 | Discharge: 2014-01-13 | Disposition: A | Discharge status: Home / Self Care | Payer: Medicaid Other | Attending: Emergency Medicine | Admitting: Emergency Medicine

## 2014-01-13 ENCOUNTER — Encounter (HOSPITAL_BASED_OUTPATIENT_CLINIC_OR_DEPARTMENT_OTHER): Payer: Self-pay | Admitting: Emergency Medicine

## 2014-01-13 DIAGNOSIS — F411 Generalized anxiety disorder: Secondary | ICD-10-CM | POA: Diagnosis not present

## 2014-01-13 DIAGNOSIS — Z79899 Other long term (current) drug therapy: Secondary | ICD-10-CM | POA: Insufficient documentation

## 2014-01-13 DIAGNOSIS — Z9884 Bariatric surgery status: Secondary | ICD-10-CM | POA: Insufficient documentation

## 2014-01-13 DIAGNOSIS — Z8639 Personal history of other endocrine, nutritional and metabolic disease: Secondary | ICD-10-CM | POA: Insufficient documentation

## 2014-01-13 DIAGNOSIS — Z862 Personal history of diseases of the blood and blood-forming organs and certain disorders involving the immune mechanism: Secondary | ICD-10-CM | POA: Insufficient documentation

## 2014-01-13 DIAGNOSIS — J45901 Unspecified asthma with (acute) exacerbation: Secondary | ICD-10-CM | POA: Diagnosis not present

## 2014-01-13 DIAGNOSIS — Z8719 Personal history of other diseases of the digestive system: Secondary | ICD-10-CM | POA: Insufficient documentation

## 2014-01-13 DIAGNOSIS — Z880 Allergy status to penicillin: Secondary | ICD-10-CM | POA: Diagnosis not present

## 2014-01-13 DIAGNOSIS — R109 Unspecified abdominal pain: Secondary | ICD-10-CM | POA: Insufficient documentation

## 2014-01-13 DIAGNOSIS — R1012 Left upper quadrant pain: Secondary | ICD-10-CM | POA: Insufficient documentation

## 2014-01-13 HISTORY — DX: Unspecified asthma, uncomplicated: J45.909

## 2014-01-13 LAB — URINALYSIS, ROUTINE W REFLEX MICROSCOPIC
GLUCOSE, UA: NEGATIVE mg/dL
Hgb urine dipstick: NEGATIVE
KETONES UR: NEGATIVE mg/dL
Leukocytes, UA: NEGATIVE
Nitrite: NEGATIVE
PH: 5 (ref 5.0–8.0)
Protein, ur: NEGATIVE mg/dL
Specific Gravity, Urine: 1.031 — ABNORMAL HIGH (ref 1.005–1.030)
Urobilinogen, UA: 0.2 mg/dL (ref 0.0–1.0)

## 2014-01-13 LAB — COMPREHENSIVE METABOLIC PANEL
ALBUMIN: 3.4 g/dL — AB (ref 3.5–5.2)
ALT: 16 U/L (ref 0–35)
AST: 14 U/L (ref 0–37)
Alkaline Phosphatase: 151 U/L — ABNORMAL HIGH (ref 39–117)
Anion gap: 12 (ref 5–15)
BUN: 15 mg/dL (ref 6–23)
CO2: 26 mEq/L (ref 19–32)
Calcium: 10 mg/dL (ref 8.4–10.5)
Chloride: 100 mEq/L (ref 96–112)
Creatinine, Ser: 0.7 mg/dL (ref 0.50–1.10)
GFR calc non Af Amer: >90 mL/min (ref >90)
Glucose, Bld: 98 mg/dL (ref 70–99)
Potassium: 4.6 mEq/L (ref 3.7–5.3)
Sodium: 138 mEq/L (ref 137–147)
Total Protein: 6.9 g/dL (ref 6.0–8.3)

## 2014-01-13 LAB — CBC WITH DIFFERENTIAL/PLATELET
BASOS PCT: 1 % (ref 0–1)
Basophils Absolute: 0 10*3/uL (ref 0.0–0.1)
EOS ABS: 0.1 10*3/uL (ref 0.0–0.7)
Eosinophils Relative: 2 % (ref 0–5)
HEMATOCRIT: 39.9 % (ref 36.0–46.0)
HEMOGLOBIN: 13 g/dL (ref 12.0–15.0)
Lymphocytes Relative: 40 % (ref 12–46)
Lymphs Abs: 2 10*3/uL (ref 0.7–4.0)
MCH: 27.8 pg (ref 26.0–34.0)
MCHC: 32.6 g/dL (ref 30.0–36.0)
MCV: 85.3 fL (ref 78.0–100.0)
MONOS PCT: 5 % (ref 3–12)
Monocytes Absolute: 0.3 10*3/uL (ref 0.1–1.0)
Neutro Abs: 2.7 10*3/uL (ref 1.7–7.7)
Neutrophils Relative %: 53 % (ref 43–77)
Platelets: 350 10*3/uL (ref 150–400)
RBC: 4.68 MIL/uL (ref 3.87–5.11)
RDW: 16 % — ABNORMAL HIGH (ref 11.5–15.5)
WBC: 5 10*3/uL (ref 4.0–10.5)

## 2014-01-13 LAB — LIPASE, BLOOD: LIPASE: 52 U/L (ref 11–59)

## 2014-01-13 MED ORDER — DICYCLOMINE HCL 20 MG PO TABS: 20.0000 mg | ORAL_TABLET | Freq: Four times a day (QID) | ORAL | Status: DC | PRN

## 2014-01-13 MED ORDER — HYDROMORPHONE HCL PF 1 MG/ML IJ SOLN
1.0000 mg | Freq: Once | INTRAMUSCULAR | Status: AC
  Administered 2014-01-13: 1 mg via INTRAVENOUS
  Filled 2014-01-13: qty 1

## 2014-01-13 MED ORDER — METOCLOPRAMIDE HCL 5 MG/ML IJ SOLN
10.0000 mg | Freq: Once | INTRAMUSCULAR | Status: AC
  Administered 2014-01-13: 10 mg via INTRAVENOUS
  Filled 2014-01-13: qty 2

## 2014-01-13 MED ORDER — PROMETHAZINE HCL 25 MG PO TABS: 25.0000 mg | ORAL_TABLET | Freq: Four times a day (QID) | ORAL | Status: DC | PRN

## 2014-01-13 MED ORDER — IOHEXOL 300 MG/ML  SOLN
50.0000 mL | Freq: Once | INTRAMUSCULAR | Status: AC | PRN
  Administered 2014-01-13: 50 mL via ORAL

## 2014-01-13 MED ORDER — IOHEXOL 300 MG/ML  SOLN
100.0000 mL | Freq: Once | INTRAMUSCULAR | Status: AC | PRN
  Administered 2014-01-13: 100 mL via INTRAVENOUS

## 2014-01-13 MED ORDER — PANTOPRAZOLE SODIUM 40 MG PO TBEC: 40.0000 mg | DELAYED_RELEASE_TABLET | Freq: Every day | ORAL | Status: DC

## 2014-01-13 NOTE — ED Notes (Signed)
Pt reports pain starting to go up again.  4/10 now.  MD notified.  VO received for another dose of Dilaudid 1mg .

## 2014-01-13 NOTE — ED Provider Notes (Signed)
CSN: [id number]     Arrival date & time 01/13/14  1716 History  This chart was scribed for Houston Siren III, * by Jeanell Sparrow, ED Scribe. This patient was seen in room MH10/MH10 and the patient's care was started at 6:52 PM.   Chief Complaint  Patient presents with  . Abdominal Pain   Patient is a 43 y.o. female presenting with abdominal pain. The history is provided by the patient. No language interpreter was used.  Abdominal Pain Pain location:  LUQ Pain radiates to:  Does not radiate Pain severity:  Moderate Onset quality:  Sudden Duration:  12 hours Timing:  Constant Progression:  Unchanged Chronicity:  Recurrent Relieved by:  Nothing Worsened by:  Not moving Ineffective treatments:  Heat and acetaminophen Associated symptoms: nausea and shortness of breath   Associated symptoms: no chest pain, no dysuria, no fever and no vomiting    HPI Comments: Peggy Austin is a 43 y.o. female with a hx ulcers who presents to the Emergency Department complaining of constant moderate upper left abdominal pain that started last night. She states that the pain feels similar to an ulcer. She reports that she has been stressed out lately. She states that a certain position while laying down exacerbates the pain. She reports that she has associated SOB, nausea, and anxiety. She states that she had ibuprofen, tylenol, and applied heat to the affected area with no relief. She denies any chest pain, emesis, fever, urinary symptoms, or changes in BMs.   Past Medical History  Diagnosis Date  . Hernia   . IBS (irritable bowel syndrome)   . Ulcer   . Thyroid disease   . PIH (pregnancy induced hypertension)   . Asthma    Past Surgical History  Procedure Laterality Date  . Gastric bypass    . Cholecystectomy    . Tubal ligation    . Endometrial ablation     History reviewed. No pertinent family history. History  Substance Use Topics  . Smoking status: Never Smoker   . Smokeless tobacco:  Not on file  . Alcohol Use: No   OB History   Grav Para Term Preterm Abortions TAB SAB Ect Mult Living                 Review of Systems  Constitutional: Negative for fever.  Respiratory: Positive for shortness of breath.   Cardiovascular: Negative for chest pain.  Gastrointestinal: Positive for nausea and abdominal pain. Negative for vomiting.  Genitourinary: Negative for dysuria.  Psychiatric/Behavioral: The patient is nervous/anxious.   All other systems reviewed and are negative.   Allergies  Amoxicillin; Morphine; Penicillins; and Moxifloxacin  Home Medications   Prior to Admission medications   Medication Sig Start Date End Date Taking? Authorizing Provider  ALPRAZolam Duanne Moron) 1 MG tablet Take 1 mg by mouth at bedtime as needed. For anxiety and panic attacks.    Historical Provider, MD  amphetamine-dextroamphetamine (ADDERALL) 20 MG tablet Take 20 mg by mouth 3 (three) times daily.    Historical Provider, MD  dicyclomine (BENTYL) 20 MG tablet Take 20 mg by mouth 4 (four) times daily as needed for spasms. 01/03/11 07/12/13  Fransico Meadow, PA-C  diphenoxylate-atropine (LOMOTIL) 2.5-0.025 MG per tablet Take 1 tablet by mouth 4 (four) times daily as needed for diarrhea or loose stools. 07/01/12   Lisette Paz, PA-C  doxycycline (VIBRAMYCIN) 100 MG capsule Take 1 capsule (100 mg total) by mouth 2 (two) times daily. 10/29/13  Liam Graham, PA-C  ferrous sulfate dried (SLOW FE) 160 (50 FE) MG TBCR Take 160 mg by mouth 3 (three) times daily with meals.     Historical Provider, MD  Fiber CAPS Take 3 capsules by mouth daily.    Historical Provider, MD  fluconazole (DIFLUCAN) 150 MG tablet Take 1 tablet (150 mg total) by mouth once. Pick up the refill and and take second dose in 5 days if symptoms have not resolved 10/29/13   Liam Graham, PA-C  gabapentin (NEURONTIN) 300 MG capsule Take 300 mg by mouth 2 (two) times daily.    Historical Provider, MD  HYDROcodone-acetaminophen (NORCO)  5-325 MG per tablet Take 1 tablet by mouth every 4 (four) hours as needed for moderate pain. 10/29/13   Liam Graham, PA-C  HYDROcodone-acetaminophen (NORCO) 7.5-325 MG per tablet Take 1-2 tablets by mouth 3 (three) times daily as needed for moderate pain or severe pain.    Historical Provider, MD  lisinopril (PRINIVIL,ZESTRIL) 10 MG tablet Take 10 mg by mouth daily.    Historical Provider, MD  mirtazapine (REMERON) 45 MG tablet Take 45 mg by mouth at bedtime.    Historical Provider, MD  multivitamin [facility]) per tablet Take 1 tablet by mouth daily.      Historical Provider, MD  ondansetron (ZOFRAN) 8 MG tablet Take 8 mg by mouth every 8 (eight) hours as needed for nausea or vomiting.    Historical Provider, MD  OVER THE COUNTER MEDICATION Take 1 tablet by mouth daily. Seaweed Extract.    Historical Provider, MD  pantoprazole (PROTONIX) 40 MG tablet Take 40 mg by mouth daily.    Historical Provider, MD  pregabalin (LYRICA) 75 MG capsule Take 1 capsule (75 mg total) by mouth 3 (three) times daily. 10/29/13   Liam Graham, PA-C  promethazine (PHENERGAN) 25 MG tablet Take 25 mg by mouth every 6 (six) hours as needed.      Historical Provider, MD  propranolol (INDERAL) 10 MG tablet Take 10 mg by mouth 2 (two) times daily.     Historical Provider, MD  pseudoephedrine (SUDAFED) 30 MG tablet Take 30 mg by mouth every 4 (four) hours as needed for congestion.    Historical Provider, MD   BP 157/86  Pulse 99  Temp(Src) 98.1 F (36.7 C) (Oral)  Resp 16  Ht 5\' 2"  (1.575 m)  Wt 160 lb (72.576 kg)  BMI 29.26 kg/m2  SpO2 100% Physical Exam  Nursing note and vitals reviewed. Constitutional: She is oriented to person, place, and time. She appears well-developed and well-nourished. No distress.  HENT:  Head: Normocephalic and atraumatic.  Mouth/Throat: Oropharynx is clear and moist.  Eyes: Conjunctivae are normal. Pupils are equal, round, and reactive to light. No scleral icterus.  Neck: Neck  supple.  Cardiovascular: Normal rate, regular rhythm, normal heart sounds and intact distal pulses.   No murmur heard. Pulmonary/Chest: Effort normal and breath sounds normal. No stridor. No respiratory distress. She has no rales.  Abdominal: Soft. Bowel sounds are normal. She exhibits mass (LUQ, small, tender). She exhibits no distension. There is tenderness in the left upper quadrant. There is no rigidity, no rebound and no guarding.  Musculoskeletal: Normal range of motion.  Neurological: She is alert and oriented to person, place, and time.  Skin: Skin is warm and dry. No rash noted.  Psychiatric: She has a normal mood and affect. Her behavior is normal.    ED Course  Procedures (including critical care time) DIAGNOSTIC  STUDIES: Oxygen Saturation is 100% on RA, normal by my interpretation.    COORDINATION OF CARE: 6:56 PM- Pt advised of plan for treatment which includes medication, radiology, and labs and pt agrees.  Labs Review Labs Reviewed  URINALYSIS, ROUTINE W REFLEX MICROSCOPIC - Abnormal; Notable for the following:    APPearance CLOUDY (*)    Specific Gravity, Urine 1.031 (*)    Bilirubin Urine SMALL (*)    All other components within normal limits  CBC WITH DIFFERENTIAL - Abnormal; Notable for the following:    RDW 16.0 (*)    All other components within normal limits  COMPREHENSIVE METABOLIC PANEL - Abnormal; Notable for the following:    Albumin 3.4 (*)    Alkaline Phosphatase 151 (*)    Total Bilirubin <0.2 (*)    All other components within normal limits  LIPASE, BLOOD    Imaging Review Ct Abdomen Pelvis W Contrast  01/13/2014   CLINICAL DATA:  Abdominal pain. Previous gastric bypass and cholecystectomy.  EXAM: CT ABDOMEN AND PELVIS WITH CONTRAST  TECHNIQUE: Multidetector CT imaging of the abdomen and pelvis was performed using the standard protocol following bolus administration of intravenous contrast.  CONTRAST:  73mL OMNIPAQUE IOHEXOL 300 MG/ML SOLN, 165mL  OMNIPAQUE IOHEXOL 300 MG/ML SOLN  COMPARISON:  07/01/2012 and 07/15/2013  FINDINGS: Lung bases are within normal.  Abdominal images demonstrate postsurgical changes over the stomach compatible previous gastric bypass. There is evidence of patient's prior cholecystectomy. There are a couple small liver hypodensities unchanged and likely cysts. There is very minimal prominence of the central intrahepatic ducts unchanged. Prominence of the common bile duct measuring 1.7 cm unchanged. The spleen, pancreas and adrenal glands are within normal. Kidneys are unremarkable. The appendix is normal. There is a retro aortic left renal vein. There is no free fluid or focal inflammatory change. There are no dilated loops of small bowel. Contrast and stool present within the colon.  Pelvic images demonstrate the uterus, ovaries, bladder and rectum to the within normal. There is degenerative disc disease at the L5-S1 level.  IMPRESSION: No acute findings in the abdomen/pelvis.  Postsurgical change compatible patient's prior gastric bypass procedure and cholecystectomy. Stable chronic prominence of the common bowel duct measuring 1.7 cm.  A couple small liver hypodensities unchanged likely cysts.   Electronically Signed   By: Marin Olp M.D.   On: 01/13/2014 22:12  All radiology studies independently viewed by me.      EKG Interpretation None      MDM   Final diagnoses:  Left upper quadrant pain    43 yo female with LUQ abdominal pain.  Hx of gastric bypass.  Labwork unremarkable.  CT negative.  Pain controlled.  I think she likely has gastritis as she has been off her protonix and under stress.  She appears stable for DC home.  Given return precautions.   I personally performed the services described in this documentation, which was scribed in my presence. The recorded information has been reviewed and is accurate.     Houston Siren III, MD 01/13/14 [phone]

## 2014-01-13 NOTE — ED Notes (Signed)
Pt c/o left lower abd pain x 12 hrs

## 2014-01-13 NOTE — ED Notes (Signed)
Patient transported to CT 

## 2014-01-13 NOTE — ED Notes (Signed)
Pain in LUQ. Feels like IBS.

## 2014-01-13 NOTE — Discharge Instructions (Signed)

## 2014-01-13 NOTE — ED Notes (Signed)
Pt placed on pulse ox, bp and cardiac monitoring.

## 2014-01-20 ENCOUNTER — Emergency Department (HOSPITAL_COMMUNITY): Payer: Medicaid Other

## 2014-01-20 ENCOUNTER — Emergency Department (HOSPITAL_COMMUNITY)
Admission: EM | Admit: 2014-01-20 | Discharge: 2014-01-20 | Disposition: A | Discharge status: Home / Self Care | Payer: Medicaid Other | Attending: Emergency Medicine | Admitting: Emergency Medicine

## 2014-01-20 ENCOUNTER — Encounter (HOSPITAL_COMMUNITY): Payer: Self-pay | Admitting: Emergency Medicine

## 2014-01-20 DIAGNOSIS — Z8639 Personal history of other endocrine, nutritional and metabolic disease: Secondary | ICD-10-CM | POA: Insufficient documentation

## 2014-01-20 DIAGNOSIS — Z79899 Other long term (current) drug therapy: Secondary | ICD-10-CM | POA: Insufficient documentation

## 2014-01-20 DIAGNOSIS — K5900 Constipation, unspecified: Secondary | ICD-10-CM | POA: Diagnosis not present

## 2014-01-20 DIAGNOSIS — J45909 Unspecified asthma, uncomplicated: Secondary | ICD-10-CM | POA: Diagnosis not present

## 2014-01-20 DIAGNOSIS — R1012 Left upper quadrant pain: Secondary | ICD-10-CM | POA: Insufficient documentation

## 2014-01-20 DIAGNOSIS — Z872 Personal history of diseases of the skin and subcutaneous tissue: Secondary | ICD-10-CM | POA: Diagnosis not present

## 2014-01-20 DIAGNOSIS — Z792 Long term (current) use of antibiotics: Secondary | ICD-10-CM | POA: Diagnosis not present

## 2014-01-20 DIAGNOSIS — Z880 Allergy status to penicillin: Secondary | ICD-10-CM | POA: Diagnosis not present

## 2014-01-20 DIAGNOSIS — Z9089 Acquired absence of other organs: Secondary | ICD-10-CM | POA: Diagnosis not present

## 2014-01-20 DIAGNOSIS — K589 Irritable bowel syndrome without diarrhea: Secondary | ICD-10-CM | POA: Insufficient documentation

## 2014-01-20 DIAGNOSIS — Z862 Personal history of diseases of the blood and blood-forming organs and certain disorders involving the immune mechanism: Secondary | ICD-10-CM | POA: Insufficient documentation

## 2014-01-20 DIAGNOSIS — Z9851 Tubal ligation status: Secondary | ICD-10-CM | POA: Insufficient documentation

## 2014-01-20 DIAGNOSIS — Z9884 Bariatric surgery status: Secondary | ICD-10-CM | POA: Insufficient documentation

## 2014-01-20 HISTORY — DX: Acute pancreatitis without necrosis or infection, unspecified: K85.90

## 2014-01-20 HISTORY — DX: Gastroparesis: K31.84

## 2014-01-20 HISTORY — DX: Partial intestinal obstruction, unspecified as to cause: K56.600

## 2014-01-20 LAB — COMPREHENSIVE METABOLIC PANEL
ALBUMIN: 3.2 g/dL — AB (ref 3.5–5.2)
ALK PHOS: 104 U/L (ref 39–117)
ALT: 13 U/L (ref 0–35)
AST: 13 U/L (ref 0–37)
Anion gap: 10 (ref 5–15)
BUN: 17 mg/dL (ref 6–23)
CO2: 23 mEq/L (ref 19–32)
Calcium: 9.5 mg/dL (ref 8.4–10.5)
Chloride: 104 mEq/L (ref 96–112)
Creatinine, Ser: 0.65 mg/dL (ref 0.50–1.10)
GFR calc Af Amer: >90 mL/min (ref >90)
GFR calc non Af Amer: >90 mL/min (ref >90)
Glucose, Bld: 100 mg/dL — ABNORMAL HIGH (ref 70–99)
POTASSIUM: 4.6 meq/L (ref 3.7–5.3)
Sodium: 137 mEq/L (ref 137–147)
TOTAL PROTEIN: 6.5 g/dL (ref 6.0–8.3)
Total Bilirubin: <0.2 mg/dL — ABNORMAL LOW (ref 0.3–1.2)

## 2014-01-20 LAB — CBC WITH DIFFERENTIAL/PLATELET
BASOS ABS: 0.1 10*3/uL (ref 0.0–0.1)
BASOS PCT: 1 % (ref 0–1)
Eosinophils Absolute: 0.3 10*3/uL (ref 0.0–0.7)
Eosinophils Relative: 7 % — ABNORMAL HIGH (ref 0–5)
HCT: 38.2 % (ref 36.0–46.0)
HEMOGLOBIN: 12.2 g/dL (ref 12.0–15.0)
Lymphocytes Relative: 44 % (ref 12–46)
Lymphs Abs: 2.1 10*3/uL (ref 0.7–4.0)
MCH: 27.1 pg (ref 26.0–34.0)
MCHC: 31.9 g/dL (ref 30.0–36.0)
MCV: 84.9 fL (ref 78.0–100.0)
MONOS PCT: 6 % (ref 3–12)
Monocytes Absolute: 0.3 10*3/uL (ref 0.1–1.0)
NEUTROS ABS: 2.1 10*3/uL (ref 1.7–7.7)
NEUTROS PCT: 44 % (ref 43–77)
PLATELETS: 319 10*3/uL (ref 150–400)
RBC: 4.5 MIL/uL (ref 3.87–5.11)
RDW: 16.4 % — AB (ref 11.5–15.5)
WBC: 4.9 10*3/uL (ref 4.0–10.5)

## 2014-01-20 LAB — URINALYSIS, ROUTINE W REFLEX MICROSCOPIC
Bilirubin Urine: NEGATIVE
Glucose, UA: NEGATIVE mg/dL
Hgb urine dipstick: NEGATIVE
Ketones, ur: NEGATIVE mg/dL
Nitrite: NEGATIVE
PH: 6.5 (ref 5.0–8.0)
Protein, ur: NEGATIVE mg/dL
SPECIFIC GRAVITY, URINE: 1.009 (ref 1.005–1.030)
Urobilinogen, UA: 0.2 mg/dL (ref 0.0–1.0)

## 2014-01-20 LAB — URINE MICROSCOPIC-ADD ON

## 2014-01-20 LAB — LIPASE, BLOOD: Lipase: 45 U/L (ref 11–59)

## 2014-01-20 MED ORDER — GI COCKTAIL ~~LOC~~
30.0000 mL | Freq: Once | ORAL | Status: AC
  Administered 2014-01-20: 30 mL via ORAL
  Filled 2014-01-20: qty 30

## 2014-01-20 MED ORDER — HYDROMORPHONE HCL PF 1 MG/ML IJ SOLN
1.0000 mg | Freq: Once | INTRAMUSCULAR | Status: AC
  Administered 2014-01-20: 1 mg via INTRAVENOUS
  Filled 2014-01-20: qty 1

## 2014-01-20 MED ORDER — METOCLOPRAMIDE HCL 10 MG PO TABS: 10.0000 mg | ORAL_TABLET | Freq: Three times a day (TID) | ORAL | Status: DC | PRN

## 2014-01-20 MED ORDER — OXYCODONE-ACETAMINOPHEN 5-325 MG PO TABS
2.0000 | ORAL_TABLET | Freq: Once | ORAL | Status: AC
  Administered 2014-01-20: 2 via ORAL
  Filled 2014-01-20: qty 2

## 2014-01-20 MED ORDER — ONDANSETRON HCL 4 MG/2ML IJ SOLN
4.0000 mg | Freq: Once | INTRAMUSCULAR | Status: AC
  Administered 2014-01-20: 4 mg via INTRAVENOUS
  Filled 2014-01-20: qty 2

## 2014-01-20 NOTE — ED Provider Notes (Signed)
CSN: [id number]     Arrival date & time 01/20/14  1637 History   First MD Initiated Contact with Patient 01/20/14 1750     Chief Complaint  Patient presents with  . Flank Pain     (Consider location/radiation/quality/duration/timing/severity/associated sxs/prior Treatment) Patient is a 43 y.o. female presenting with abdominal pain.  Abdominal Pain Pain location:  LUQ Pain quality: burning and sharp   Pain quality comment:  Baseline burning with intermittent sharp stabbing pains Pain radiates to:  Does not radiate Pain severity:  Severe Onset quality:  Gradual Duration:  10 days Timing:  Constant Progression:  Worsening Chronicity:  Recurrent Context comment:  Seen in ED 7 days ago for same pain.  "usually it doesn't last this long" Relieved by:  Nothing Ineffective treatments: bentyl, protonix, norco. Associated symptoms: constipation and nausea   Associated symptoms: no diarrhea, no dysuria, no fever and no vomiting     Past Medical History  Diagnosis Date  . Hernia   . IBS (irritable bowel syndrome)   . Ulcer   . Thyroid disease   . PIH (pregnancy induced hypertension)   . Asthma   . Gastroparesis   . Pancreatitis   . Partial bowel obstruction    Past Surgical History  Procedure Laterality Date  . Gastric bypass    . Cholecystectomy    . Tubal ligation    . Endometrial ablation     History reviewed. No pertinent family history. History  Substance Use Topics  . Smoking status: Never Smoker   . Smokeless tobacco: Not on file  . Alcohol Use: No   OB History   Grav Para Term Preterm Abortions TAB SAB Ect Mult Living                 Review of Systems  Constitutional: Negative for fever.  Gastrointestinal: Positive for nausea, abdominal pain and constipation. Negative for vomiting and diarrhea.  Genitourinary: Negative for dysuria.  All other systems reviewed and are negative.     Allergies  Amoxicillin; Morphine; Penicillins; and Moxifloxacin  Home  Medications   Prior to Admission medications   Medication Sig Start Date End Date Taking? Authorizing Provider  ALPRAZolam Duanne Moron) 1 MG tablet Take 1 mg by mouth at bedtime as needed. For anxiety and panic attacks.    Historical Provider, MD  amphetamine-dextroamphetamine (ADDERALL) 20 MG tablet Take 20 mg by mouth 3 (three) times daily.    Historical Provider, MD  dicyclomine (BENTYL) 20 MG tablet Take 1 tablet (20 mg total) by mouth 4 (four) times daily as needed for spasms. 01/13/14 07/22/16  Houston Siren III, MD  diphenoxylate-atropine (LOMOTIL) 2.5-0.025 MG per tablet Take 1 tablet by mouth 4 (four) times daily as needed for diarrhea or loose stools. 07/01/12   Lisette Paz, PA-C  doxycycline (VIBRAMYCIN) 100 MG capsule Take 1 capsule (100 mg total) by mouth 2 (two) times daily. 10/29/13   Liam Graham, PA-C  ferrous sulfate dried (SLOW FE) 160 (50 FE) MG TBCR Take 160 mg by mouth 3 (three) times daily with meals.     Historical Provider, MD  Fiber CAPS Take 3 capsules by mouth daily.    Historical Provider, MD  fluconazole (DIFLUCAN) 150 MG tablet Take 1 tablet (150 mg total) by mouth once. Pick up the refill and and take second dose in 5 days if symptoms have not resolved 10/29/13   Liam Graham, PA-C  gabapentin (NEURONTIN) 300 MG capsule Take 300 mg by mouth 2 (two)  times daily.    Historical Provider, MD  HYDROcodone-acetaminophen (NORCO) 5-325 MG per tablet Take 1 tablet by mouth every 4 (four) hours as needed for moderate pain. 10/29/13   Liam Graham, PA-C  HYDROcodone-acetaminophen (NORCO) 7.5-325 MG per tablet Take 1-2 tablets by mouth 3 (three) times daily as needed for moderate pain or severe pain.    Historical Provider, MD  lisinopril (PRINIVIL,ZESTRIL) 10 MG tablet Take 10 mg by mouth daily.    Historical Provider, MD  mirtazapine (REMERON) 45 MG tablet Take 45 mg by mouth at bedtime.    Historical Provider, MD  multivitamin [facility]) per tablet Take 1 tablet by mouth  daily.      Historical Provider, MD  ondansetron (ZOFRAN) 8 MG tablet Take 8 mg by mouth every 8 (eight) hours as needed for nausea or vomiting.    Historical Provider, MD  OVER THE COUNTER MEDICATION Take 1 tablet by mouth daily. Seaweed Extract.    Historical Provider, MD  pantoprazole (PROTONIX) 40 MG tablet Take 1 tablet (40 mg total) by mouth daily. 01/13/14   Houston Siren III, MD  pregabalin (LYRICA) 75 MG capsule Take 1 capsule (75 mg total) by mouth 3 (three) times daily. 10/29/13   Liam Graham, PA-C  promethazine (PHENERGAN) 25 MG tablet Take 1 tablet (25 mg total) by mouth every 6 (six) hours as needed. 01/13/14   Houston Siren III, MD  propranolol (INDERAL) 10 MG tablet Take 10 mg by mouth 2 (two) times daily.     Historical Provider, MD  pseudoephedrine (SUDAFED) 30 MG tablet Take 30 mg by mouth every 4 (four) hours as needed for congestion.    Historical Provider, MD   BP 130/99  Pulse 103  Temp(Src) 98.7 F (37.1 C)  Resp 18  SpO2 99% Physical Exam  Nursing note and vitals reviewed. Constitutional: She is oriented to person, place, and time. She appears well-developed and well-nourished. No distress.  HENT:  Head: Normocephalic and atraumatic.  Mouth/Throat: Oropharynx is clear and moist.  Eyes: Conjunctivae are normal. Pupils are equal, round, and reactive to light. No scleral icterus.  Neck: Neck supple.  Cardiovascular: Normal rate, regular rhythm, normal heart sounds and intact distal pulses.   No murmur heard. Pulmonary/Chest: Effort normal and breath sounds normal. No stridor. No respiratory distress. She has no rales.  Abdominal: Soft. Bowel sounds are normal. She exhibits no distension. There is no tenderness.  Musculoskeletal: Normal range of motion.  Neurological: She is alert and oriented to person, place, and time.  Skin: Skin is warm and dry. No rash noted.  Psychiatric: She has a normal mood and affect. Her behavior is normal.    ED Course   Procedures (including critical care time) Labs Review Labs Reviewed  CBC WITH DIFFERENTIAL - Abnormal; Notable for the following:    RDW 16.4 (*)    Eosinophils Relative 7 (*)    All other components within normal limits  COMPREHENSIVE METABOLIC PANEL - Abnormal; Notable for the following:    Glucose, Bld 100 (*)    Albumin 3.2 (*)    Total Bilirubin <0.2 (*)    All other components within normal limits  URINALYSIS, ROUTINE W REFLEX MICROSCOPIC - Abnormal; Notable for the following:    APPearance HAZY (*)    Leukocytes, UA TRACE (*)    All other components within normal limits  URINE MICROSCOPIC-ADD ON - Abnormal; Notable for the following:    Squamous Epithelial / LPF MANY (*)  Bacteria, UA FEW (*)    All other components within normal limits  LIPASE, BLOOD    Imaging Review Dg Abd Acute W/chest  01/20/2014   CLINICAL DATA:  Left-sided flank pain and nausea. History of multiple abdominal surgeries an abdominal issues.  EXAM: ACUTE ABDOMEN SERIES (ABDOMEN 2 VIEW & CHEST 1 VIEW)  COMPARISON:  07/13/2013; 06/11/2013; CT abdomen and pelvis - 01/13/2014  FINDINGS: Grossly unchanged cardiac silhouette and mediastinal contours. No focal parenchymal opacities. No pleural effusion or pneumothorax. No evidence of edema.  Moderate to large colonic stool burden without evidence of obstruction. No pneumoperitoneum, pneumatosis or portal venous gas.  Enteric sutures overlie the left upper and mid abdomen. Surgical clips overlie the expected location of the gastroesophageal junction. Post cholecystectomy.  No acute osseus abnormalities.  IMPRESSION: 1.  No acute cardiopulmonary disease. 2. Moderate colonic stool burden without evidence of obstruction.   Electronically Signed   By: Sandi Mariscal M.D.   On: 01/20/2014 19:38  All radiology studies independently viewed by me.      EKG Interpretation None      MDM   Final diagnoses:  LUQ abdominal pain    43 yo female with LUQ abdominal pain.   Persistent for past 10 days.  I evaluated her a week ago at which time labs and CT imaging were negative.  She reported the pain has been persistent.  On exam, she was tearful, but nontoxic.  Repeat abdominal showed LUQ TTP with soft abdomen without r/r/g.  I did not think she needed repeat CT imaging today.  Her pain and nausea were well controlled in the ED and she tolerated PO intake easily.  I again advised that she follow up with her outpatient doctors.      Houston Siren III, MD 01/20/14 [phone]

## 2014-01-20 NOTE — Discharge Instructions (Signed)
Abdominal Pain, Women °Abdominal (stomach, pelvic, or belly) pain can be caused by many things. It is important to tell your doctor: °· The location of the pain. °· Does it come and go or is it present all the time? °· Are there things that start the pain (eating certain foods, exercise)? °· Are there other symptoms associated with the pain (fever, nausea, vomiting, diarrhea)? °All of this is helpful to know when trying to find the cause of the pain. °CAUSES  °· Stomach: virus or bacteria infection, or ulcer. °· Intestine: appendicitis (inflamed appendix), regional ileitis (Crohn's disease), ulcerative colitis (inflamed colon), irritable bowel syndrome, diverticulitis (inflamed diverticulum of the colon), or cancer of the stomach or intestine. °· Gallbladder disease or stones in the gallbladder. °· Kidney disease, kidney stones, or infection. °· Pancreas infection or cancer. °· Fibromyalgia (pain disorder). °· Diseases of the female organs: °¨ Uterus: fibroid (non-cancerous) tumors or infection. °¨ Fallopian tubes: infection or tubal pregnancy. °¨ Ovary: cysts or tumors. °¨ Pelvic adhesions (scar tissue). °¨ Endometriosis (uterus lining tissue growing in the pelvis and on the pelvic organs). °¨ Pelvic congestion syndrome (female organs filling up with blood just before the menstrual period). °¨ Pain with the menstrual period. °¨ Pain with ovulation (producing an egg). °¨ Pain with an IUD (intrauterine device, birth control) in the uterus. °¨ Cancer of the female organs. °· Functional pain (pain not caused by a disease, may improve without treatment). °· Psychological pain. °· Depression. °DIAGNOSIS  °Your doctor will decide the seriousness of your pain by doing an examination. °· Blood tests. °· X-rays. °· Ultrasound. °· CT scan (computed tomography, special type of X-ray). °· MRI (magnetic resonance imaging). °· Cultures, for infection. °· Barium enema (dye inserted in the large intestine, to better view it with  X-rays). °· Colonoscopy (looking in intestine with a lighted tube). °· Laparoscopy (minor surgery, looking in abdomen with a lighted tube). °· Major abdominal exploratory surgery (looking in abdomen with a large incision). °TREATMENT  °The treatment will depend on the cause of the pain.  °· Many cases can be observed and treated at home. °· Over-the-counter medicines recommended by your caregiver. °· Prescription medicine. °· Antibiotics, for infection. °· Birth control pills, for painful periods or for ovulation pain. °· Hormone treatment, for endometriosis. °· Nerve blocking injections. °· Physical therapy. °· Antidepressants. °· Counseling with a psychologist or psychiatrist. °· Minor or major surgery. °HOME CARE INSTRUCTIONS  °· Do not take laxatives, unless directed by your caregiver. °· Take over-the-counter pain medicine only if ordered by your caregiver. Do not take aspirin because it can cause an upset stomach or bleeding. °· Try a clear liquid diet (broth or water) as ordered by your caregiver. Slowly move to a bland diet, as tolerated, if the pain is related to the stomach or intestine. °· Have a thermometer and take your temperature several times a day, and record it. °· Bed rest and sleep, if it helps the pain. °· Avoid sexual intercourse, if it causes pain. °· Avoid stressful situations. °· Keep your follow-up appointments and tests, as your caregiver orders. °· If the pain does not go away with medicine or surgery, you may try: °¨ Acupuncture. °¨ Relaxation exercises (yoga, meditation). °¨ Group therapy. °¨ Counseling. °SEEK MEDICAL CARE IF:  °· You notice certain foods cause stomach pain. °· Your home care treatment is not helping your pain. °· You need stronger pain medicine. °· You want your IUD removed. °· You feel faint or   lightheaded. °· You develop nausea and vomiting. °· You develop a rash. °· You are having side effects or an allergy to your medicine. °SEEK IMMEDIATE MEDICAL CARE IF:  °· Your  pain does not go away or gets worse. °· You have a fever. °· Your pain is felt only in portions of the abdomen. The right side could possibly be appendicitis. The left lower portion of the abdomen could be colitis or diverticulitis. °· You are passing blood in your stools (bright red or black tarry stools, with or without vomiting). °· You have blood in your urine. °· You develop chills, with or without a fever. °· You pass out. °MAKE SURE YOU:  °· Understand these instructions. °· Will watch your condition. °· Will get help right away if you are not doing well or get worse. °Document Released: 03/20/2007 Document Revised: 10/07/2013 Document Reviewed: 04/09/2009 °Exit[employer]® Patient Information ©2015 [employer]. This information is not intended to replace advice given to you by your health care provider. Make sure you discuss any questions you have with your health care provider. ° °

## 2014-01-20 NOTE — ED Notes (Signed)
Pt in c/o left flank pain with nausea, history of multiple abdominal surgeries and abdominal issues, symptoms for 10 days, was seen at [facility] for same earlier in the week

## 2014-01-26 ENCOUNTER — Emergency Department (HOSPITAL_COMMUNITY)
Admission: EM | Admit: 2014-01-26 | Discharge: 2014-01-26 | Disposition: A | Discharge status: Home / Self Care | Payer: Medicaid Other | Attending: Emergency Medicine | Admitting: Emergency Medicine

## 2014-01-26 ENCOUNTER — Encounter (HOSPITAL_COMMUNITY): Payer: Self-pay | Admitting: Emergency Medicine

## 2014-01-26 DIAGNOSIS — R1110 Vomiting, unspecified: Secondary | ICD-10-CM | POA: Diagnosis present

## 2014-01-26 DIAGNOSIS — Z9089 Acquired absence of other organs: Secondary | ICD-10-CM | POA: Diagnosis not present

## 2014-01-26 DIAGNOSIS — R1084 Generalized abdominal pain: Secondary | ICD-10-CM | POA: Insufficient documentation

## 2014-01-26 DIAGNOSIS — IMO0002 Reserved for concepts with insufficient information to code with codable children: Secondary | ICD-10-CM | POA: Diagnosis not present

## 2014-01-26 DIAGNOSIS — L989 Disorder of the skin and subcutaneous tissue, unspecified: Secondary | ICD-10-CM | POA: Insufficient documentation

## 2014-01-26 DIAGNOSIS — Z8719 Personal history of other diseases of the digestive system: Secondary | ICD-10-CM | POA: Diagnosis not present

## 2014-01-26 DIAGNOSIS — G8929 Other chronic pain: Secondary | ICD-10-CM | POA: Diagnosis not present

## 2014-01-26 DIAGNOSIS — J45909 Unspecified asthma, uncomplicated: Secondary | ICD-10-CM | POA: Diagnosis not present

## 2014-01-26 DIAGNOSIS — Z880 Allergy status to penicillin: Secondary | ICD-10-CM | POA: Insufficient documentation

## 2014-01-26 DIAGNOSIS — Z862 Personal history of diseases of the blood and blood-forming organs and certain disorders involving the immune mechanism: Secondary | ICD-10-CM | POA: Insufficient documentation

## 2014-01-26 DIAGNOSIS — Z79899 Other long term (current) drug therapy: Secondary | ICD-10-CM | POA: Diagnosis not present

## 2014-01-26 DIAGNOSIS — Z8639 Personal history of other endocrine, nutritional and metabolic disease: Secondary | ICD-10-CM | POA: Insufficient documentation

## 2014-01-26 DIAGNOSIS — R109 Unspecified abdominal pain: Secondary | ICD-10-CM

## 2014-01-26 DIAGNOSIS — R238 Other skin changes: Secondary | ICD-10-CM

## 2014-01-26 LAB — CBC WITH DIFFERENTIAL/PLATELET
BASOS PCT: 1 % (ref 0–1)
Basophils Absolute: 0 10*3/uL (ref 0.0–0.1)
EOS ABS: 0.2 10*3/uL (ref 0.0–0.7)
EOS PCT: 4 % (ref 0–5)
HEMATOCRIT: 34.4 % — AB (ref 36.0–46.0)
Hemoglobin: 11.5 g/dL — ABNORMAL LOW (ref 12.0–15.0)
Lymphocytes Relative: 49 % — ABNORMAL HIGH (ref 12–46)
Lymphs Abs: 1.9 10*3/uL (ref 0.7–4.0)
MCH: 27.3 pg (ref 26.0–34.0)
MCHC: 33.4 g/dL (ref 30.0–36.0)
MCV: 81.5 fL (ref 78.0–100.0)
MONO ABS: 0.4 10*3/uL (ref 0.1–1.0)
Monocytes Relative: 10 % (ref 3–12)
Neutro Abs: 1.4 10*3/uL — ABNORMAL LOW (ref 1.7–7.7)
Neutrophils Relative %: 36 % — ABNORMAL LOW (ref 43–77)
PLATELETS: 304 10*3/uL (ref 150–400)
RBC: 4.22 MIL/uL (ref 3.87–5.11)
RDW: 15.7 % — AB (ref 11.5–15.5)
WBC: 4 10*3/uL (ref 4.0–10.5)

## 2014-01-26 LAB — URINALYSIS, ROUTINE W REFLEX MICROSCOPIC
Glucose, UA: NEGATIVE mg/dL
KETONES UR: 15 mg/dL — AB
Leukocytes, UA: NEGATIVE
Nitrite: NEGATIVE
PH: 6 (ref 5.0–8.0)
Protein, ur: NEGATIVE mg/dL
Specific Gravity, Urine: 1.03 (ref 1.005–1.030)
UROBILINOGEN UA: 1 mg/dL (ref 0.0–1.0)

## 2014-01-26 LAB — COMPREHENSIVE METABOLIC PANEL
ALT: 16 U/L (ref 0–35)
AST: 24 U/L (ref 0–37)
Albumin: 3.5 g/dL (ref 3.5–5.2)
Alkaline Phosphatase: 96 U/L (ref 39–117)
Anion gap: 12 (ref 5–15)
BILIRUBIN TOTAL: 0.2 mg/dL — AB (ref 0.3–1.2)
BUN: 13 mg/dL (ref 6–23)
CO2: 22 meq/L (ref 19–32)
CREATININE: 0.63 mg/dL (ref 0.50–1.10)
Calcium: 9.3 mg/dL (ref 8.4–10.5)
Chloride: 104 mEq/L (ref 96–112)
GFR calc Af Amer: >90 mL/min (ref >90)
Glucose, Bld: 94 mg/dL (ref 70–99)
POTASSIUM: 3.7 meq/L (ref 3.7–5.3)
Sodium: 138 mEq/L (ref 137–147)
Total Protein: 6.5 g/dL (ref 6.0–8.3)

## 2014-01-26 LAB — URINE MICROSCOPIC-ADD ON

## 2014-01-26 MED ORDER — MUPIROCIN CALCIUM 2 % EX CREA: 1.0000 "application " | TOPICAL_CREAM | Freq: Two times a day (BID) | CUTANEOUS | Status: DC

## 2014-01-26 MED ORDER — ONDANSETRON HCL 4 MG/2ML IJ SOLN
4.0000 mg | Freq: Once | INTRAMUSCULAR | Status: AC
  Administered 2014-01-26: 4 mg via INTRAVENOUS
  Filled 2014-01-26: qty 2

## 2014-01-26 MED ORDER — ONDANSETRON 4 MG PO TBDP: 4.0000 mg | ORAL_TABLET | Freq: Three times a day (TID) | ORAL | Status: DC | PRN

## 2014-01-26 MED ORDER — SODIUM CHLORIDE 0.9 % IV SOLN
Freq: Once | INTRAVENOUS | Status: AC
  Administered 2014-01-26: 13:00:00 via INTRAVENOUS

## 2014-01-26 NOTE — ED Notes (Signed)
She states "ive been very sick since yesterday. Ive had nausea, vomiting, nervous twitching, sores on my back, and feeling disoriented. Ive not been able to eat anything. Ive had a lot of upset stomach since my gastric bypass but this is worse."

## 2014-01-26 NOTE — ED Provider Notes (Signed)
CSN: [id number]     Arrival date & time 01/26/14  1144 History   First MD Initiated Contact with Patient 01/26/14 1214     Chief Complaint  Patient presents with  . Emesis     (Consider location/radiation/quality/duration/timing/severity/associated sxs/prior Treatment) Patient is a 43 y.o. female presenting with cramps. The history is provided by the patient. No language interpreter was used.  Abdominal Cramping Pain location:  Generalized Pain quality: aching   Pain radiates to:  Does not radiate Pain severity:  Moderate Duration:  1 day Timing:  Constant Progression:  Worsening Chronicity:  New Context: not previous surgeries   Relieved by:  Nothing Worsened by:  Nothing tried Ineffective treatments:  None tried Risk factors: multiple surgeries     Past Medical History  Diagnosis Date  . Hernia   . IBS (irritable bowel syndrome)   . Ulcer   . Thyroid disease   . PIH (pregnancy induced hypertension)   . Asthma   . Gastroparesis   . Pancreatitis   . Partial bowel obstruction    Past Surgical History  Procedure Laterality Date  . Gastric bypass    . Cholecystectomy    . Tubal ligation    . Endometrial ablation     History reviewed. No pertinent family history. History  Substance Use Topics  . Smoking status: Never Smoker   . Smokeless tobacco: Not on file  . Alcohol Use: No   OB History   Grav Para Term Preterm Abortions TAB SAB Ect Mult Living                 Review of Systems  All other systems reviewed and are negative.     Allergies  Amoxicillin; Doxycycline; Morphine; Penicillins; Salmon; and Moxifloxacin  Home Medications   Prior to Admission medications   Medication Sig Start Date End Date Taking? Authorizing Provider  albuterol (PROVENTIL HFA;VENTOLIN HFA) 108 (90 BASE) MCG/ACT inhaler Inhale 1-2 puffs into the lungs every 6 (six) hours as needed for wheezing or shortness of breath.   Yes Historical Provider, MD  albuterol (PROVENTIL)  (2.5 MG/3ML) 0.083% nebulizer solution Take 2.5 mg by nebulization every 6 (six) hours as needed for wheezing or shortness of breath.   Yes Historical Provider, MD  amphetamine-dextroamphetamine (ADDERALL) 20 MG tablet Take 20 mg by mouth 3 (three) times daily.   Yes Historical Provider, MD  clonazePAM (KLONOPIN) 1 MG tablet Take 1 mg by mouth 2 (two) times daily as needed for anxiety.   Yes Historical Provider, MD  dicyclomine (BENTYL) 20 MG tablet Take 20 mg by mouth 4 (four) times daily.   Yes Historical Provider, MD  ferrous sulfate dried (SLOW FE) 160 (50 FE) MG TBCR Take 160 mg by mouth 3 (three) times daily with meals.    Yes Historical Provider, MD  Fiber CAPS Take 3 capsules by mouth daily.   Yes Historical Provider, MD  fluticasone (FLONASE) 50 MCG/ACT nasal spray Place 2 sprays into both nostrils 2 (two) times daily.   Yes Historical Provider, MD  gabapentin (NEURONTIN) 300 MG capsule Take 300 mg by mouth 2 (two) times daily.   Yes Historical Provider, MD  metoCLOPramide (REGLAN) 10 MG tablet Take 10 mg by mouth every 8 (eight) hours as needed for nausea or vomiting.   Yes Historical Provider, MD  mirtazapine (REMERON) 45 MG tablet Take 45 mg by mouth at bedtime.   Yes Historical Provider, MD  montelukast (SINGULAIR) 10 MG tablet Take 10 mg by mouth at  bedtime as needed (for allergies/asthma).   Yes Historical Provider, MD  multivitamin [facility]) per tablet Take 1 tablet by mouth daily.     Yes Historical Provider, MD  pantoprazole (PROTONIX) 40 MG tablet Take 40 mg by mouth daily.   Yes Historical Provider, MD  pregabalin (LYRICA) 25 MG capsule Take 25 mg by mouth 3 (three) times daily.   Yes Historical Provider, MD  promethazine (PHENERGAN) 25 MG tablet Take 25 mg by mouth every 6 (six) hours as needed for nausea or vomiting.   Yes Historical Provider, MD  ibuprofen (ADVIL,MOTRIN) 200 MG tablet Take 600 mg by mouth every 6 (six) hours as needed for mild pain.    Historical Provider, MD   ondansetron (ZOFRAN) 8 MG tablet Take 8 mg by mouth every 8 (eight) hours as needed for nausea or vomiting.    Historical Provider, MD   BP 126/97  Pulse 94  Temp(Src) 98 F (36.7 C) (Oral)  Resp 18  SpO2 98% Physical Exam  Nursing note and vitals reviewed. Constitutional: She is oriented to person, place, and time. She appears well-developed and well-nourished.  HENT:  Head: Normocephalic and atraumatic.  Eyes: EOM are normal. Pupils are equal, round, and reactive to light.  Neck: Normal range of motion.  Cardiovascular: Normal rate and normal heart sounds.   Pulmonary/Chest: Effort normal.  Abdominal: Soft. She exhibits no distension.  Musculoskeletal: Normal range of motion.  Neurological: She is alert and oriented to person, place, and time.  Skin: There is erythema.  Multiple erythematous areas    Psychiatric: She has a normal mood and affect.    ED Course  Procedures (including critical care time) Labs Review Labs Reviewed  COMPREHENSIVE METABOLIC PANEL - Abnormal; Notable for the following:    Total Bilirubin 0.2 (*)    All other components within normal limits  CBC WITH DIFFERENTIAL - Abnormal; Notable for the following:    Hemoglobin 11.5 (*)    HCT 34.4 (*)    RDW 15.7 (*)    Neutrophils Relative % 36 (*)    Neutro Abs 1.4 (*)    Lymphocytes Relative 49 (*)    All other components within normal limits  URINALYSIS, ROUTINE W REFLEX MICROSCOPIC - Abnormal; Notable for the following:    Color, Urine AMBER (*)    APPearance CLOUDY (*)    Hgb urine dipstick LARGE (*)    Bilirubin Urine SMALL (*)    Ketones, ur 15 (*)    All other components within normal limits  URINE MICROSCOPIC-ADD ON - Abnormal; Notable for the following:    Squamous Epithelial / LPF MANY (*)    All other components within normal limits    Imaging Review No results found.   EKG Interpretation None      MDM   Final diagnoses:  Skin irritation  Chronic abdominal pain    Pt  worried about mrsa .   Pt has multiple irritated areas on skin.  Pt given zofran odt for nausea    Fransico Meadow, [place] 01/26/14 1437

## 2014-01-26 NOTE — Discharge Instructions (Signed)
Abdominal Pain Many things can cause abdominal pain. Usually, abdominal pain is not caused by a disease and will improve without treatment. It can often be observed and treated at home. Your health care provider will do a physical exam and possibly order blood tests and X-rays to help determine the seriousness of your pain. However, in many cases, more time must pass before a clear cause of the pain can be found. Before that point, your health care provider may not know if you need more testing or further treatment. HOME CARE INSTRUCTIONS  Monitor your abdominal pain for any changes. The following actions may help to alleviate any discomfort you are experiencing:  Only take over-the-counter or prescription medicines as directed by your health care provider.  Do not take laxatives unless directed to do so by your health care provider.  Try a clear liquid diet (broth, tea, or water) as directed by your health care provider. Slowly move to a bland diet as tolerated. SEEK MEDICAL CARE IF:  You have unexplained abdominal pain.  You have abdominal pain associated with nausea or diarrhea.  You have pain when you urinate or have a bowel movement.  You experience abdominal pain that wakes you in the night.  You have abdominal pain that is worsened or improved by eating food.  You have abdominal pain that is worsened with eating fatty foods.  You have a fever. SEEK IMMEDIATE MEDICAL CARE IF:   Your pain does not go away within 2 hours.  You keep throwing up (vomiting).  Your pain is felt only in portions of the abdomen, such as the right side or the left lower portion of the abdomen.  You pass bloody or black tarry stools. MAKE SURE YOU:  Understand these instructions.   Will watch your condition.   Will get help right away if you are not doing well or get worse.  Document Released: 03/02/2005 Document Revised: 05/28/2013 Document Reviewed: 01/30/2013 [facility] Patient Information  2015 [place], [place]. This information is not intended to replace advice given to you by your health care provider. Make sure you discuss any questions you have with your health care provider. Mupirocin skin cream or ointment What is this medicine? MUPIROCIN (myoo PEER oh sin) is an antibiotic. It is used on the skin to treat skin infections. This medicine may be used for other purposes; ask your health care provider or pharmacist if you have questions. COMMON BRAND NAME(S): Bactroban, Centany, Centany AT What should I tell my health care provider before I take this medicine? They need to know if you have any of these conditions: -an unusual or allergic reaction to mupirocin, polyethylene glycol (PEG), or other topical antibiotic medicine -pregnant or trying to get pregnant -breast-feeding How should I use this medicine? This medicine is for external use only. Follow the directions on the prescription label. Wash your hands before and after use. Before applying, wash the affected area with mild soap and water and pat dry. Apply a small amount to the affected area and rub gently. You can cover the area with a gauze dressing. Do not get this medicine in your eyes. If you do, rinse out with plenty of cool tap water. Do not use your medicine more often than directed. Finish the full course of medicine prescribed by your doctor or health care professional even if you think your condition is better. Do not use over large areas of burnt skin. Talk to your pediatrician regarding the use of this medicine in  children. Special care may be needed. Overdosage: If you think you have taken too much of this medicine contact a poison control center or emergency room at once. NOTE: This medicine is only for you. Do not share this medicine with others. What if I miss a dose? If you miss a dose, take it as soon as you can. If it is almost time for your next dose, take only that dose. Do not take double or extra  doses. What may interact with this medicine? Interactions are not expected. Do not use any other skin products on the affected area without telling your doctor or health care professional. This list may not describe all possible interactions. Give your health care provider a list of all the medicines, herbs, non-prescription drugs, or dietary supplements you use. Also tell them if you smoke, drink alcohol, or use illegal drugs. Some items may interact with your medicine. What should I watch for while using this medicine? Tell your doctor or health care professional if your skin condition does not begin to improve within 3 to 5 days. What side effects may I notice from receiving this medicine? Side effects that you should report to your doctor or health care professional as soon as possible: -skin rash, redness, continued swelling, burning, itching, stinging, or pain Side effects that usually do not require medical attention (report to your doctor or health care professional if they continue or are bothersome): -dry skin, itching This list may not describe all possible side effects. Call your doctor for medical advice about side effects. You may report side effects to FDA at 1-800-FDA-1088. Where should I keep my medicine? Keep out of the reach of children. Store at room temperature between 20 and 25 degrees C (68 and 77 degrees F). Throw away any unused medicine after the expiration date. NOTE: This sheet is a summary. It may not cover all possible information. If you have questions about this medicine, talk to your doctor, pharmacist, or health care provider.  2015, Elsevier/Gold Standard. (2007-12-10 14:38:18)

## 2014-01-29 NOTE — ED Provider Notes (Signed)
Medical screening examination/treatment/procedure(s) were performed by non-physician practitioner and as supervising physician I was immediately available for consultation/collaboration.   EKG Interpretation None        Houston Siren III, MD 01/29/14 [phone]

## 2014-02-03 ENCOUNTER — Encounter (HOSPITAL_COMMUNITY): Payer: Self-pay | Admitting: Emergency Medicine

## 2014-02-03 ENCOUNTER — Emergency Department (HOSPITAL_COMMUNITY)
Admission: EM | Admit: 2014-02-03 | Discharge: 2014-02-03 | Disposition: A | Discharge status: Home / Self Care | Payer: Medicaid Other | Attending: Emergency Medicine | Admitting: Emergency Medicine

## 2014-02-03 ENCOUNTER — Emergency Department (HOSPITAL_COMMUNITY): Payer: Medicaid Other

## 2014-02-03 ENCOUNTER — Emergency Department (HOSPITAL_COMMUNITY)
Admission: EM | Admit: 2014-02-03 | Discharge: 2014-02-03 | Disposition: A | Discharge status: Other Acute Inpatient Hospital | Payer: Medicaid Other | Source: Home / Self Care | Attending: Emergency Medicine | Admitting: Emergency Medicine

## 2014-02-03 DIAGNOSIS — R1012 Left upper quadrant pain: Secondary | ICD-10-CM | POA: Insufficient documentation

## 2014-02-03 DIAGNOSIS — K589 Irritable bowel syndrome without diarrhea: Secondary | ICD-10-CM | POA: Insufficient documentation

## 2014-02-03 DIAGNOSIS — R1013 Epigastric pain: Secondary | ICD-10-CM | POA: Insufficient documentation

## 2014-02-03 DIAGNOSIS — J45909 Unspecified asthma, uncomplicated: Secondary | ICD-10-CM | POA: Insufficient documentation

## 2014-02-03 DIAGNOSIS — R1032 Left lower quadrant pain: Secondary | ICD-10-CM | POA: Diagnosis not present

## 2014-02-03 DIAGNOSIS — Z79899 Other long term (current) drug therapy: Secondary | ICD-10-CM | POA: Diagnosis not present

## 2014-02-03 DIAGNOSIS — IMO0002 Reserved for concepts with insufficient information to code with codable children: Secondary | ICD-10-CM | POA: Diagnosis not present

## 2014-02-03 DIAGNOSIS — Z880 Allergy status to penicillin: Secondary | ICD-10-CM | POA: Diagnosis not present

## 2014-02-03 DIAGNOSIS — K219 Gastro-esophageal reflux disease without esophagitis: Secondary | ICD-10-CM | POA: Diagnosis not present

## 2014-02-03 DIAGNOSIS — Z8639 Personal history of other endocrine, nutritional and metabolic disease: Secondary | ICD-10-CM | POA: Insufficient documentation

## 2014-02-03 DIAGNOSIS — Z862 Personal history of diseases of the blood and blood-forming organs and certain disorders involving the immune mechanism: Secondary | ICD-10-CM | POA: Diagnosis not present

## 2014-02-03 HISTORY — DX: Gastro-esophageal reflux disease without esophagitis: K21.9

## 2014-02-03 LAB — COMPREHENSIVE METABOLIC PANEL
ALT: 12 U/L (ref 0–35)
AST: 14 U/L (ref 0–37)
Albumin: 3.3 g/dL — ABNORMAL LOW (ref 3.5–5.2)
Alkaline Phosphatase: 89 U/L (ref 39–117)
Anion gap: 7 (ref 5–15)
BUN: 12 mg/dL (ref 6–23)
CALCIUM: 10.2 mg/dL (ref 8.4–10.5)
CHLORIDE: 103 meq/L (ref 96–112)
CO2: 28 meq/L (ref 19–32)
CREATININE: 0.82 mg/dL (ref 0.50–1.10)
GFR calc Af Amer: >90 mL/min (ref >90)
GFR, EST NON AFRICAN AMERICAN: 85 mL/min — AB (ref >90)
Glucose, Bld: 104 mg/dL — ABNORMAL HIGH (ref 70–99)
Potassium: 5.5 mEq/L — ABNORMAL HIGH (ref 3.7–5.3)
Sodium: 138 mEq/L (ref 137–147)
Total Protein: 6.5 g/dL (ref 6.0–8.3)

## 2014-02-03 LAB — CBC WITH DIFFERENTIAL/PLATELET
BASOS PCT: 2 % — AB (ref 0–1)
Basophils Absolute: 0.1 10*3/uL (ref 0.0–0.1)
EOS PCT: 6 % — AB (ref 0–5)
Eosinophils Absolute: 0.3 10*3/uL (ref 0.0–0.7)
HCT: 37.2 % (ref 36.0–46.0)
HEMOGLOBIN: 12.2 g/dL (ref 12.0–15.0)
LYMPHS PCT: 39 % (ref 12–46)
Lymphs Abs: 1.6 10*3/uL (ref 0.7–4.0)
MCH: 27.5 pg (ref 26.0–34.0)
MCHC: 32.8 g/dL (ref 30.0–36.0)
MCV: 84 fL (ref 78.0–100.0)
MONO ABS: 0.3 10*3/uL (ref 0.1–1.0)
Monocytes Relative: 8 % (ref 3–12)
Neutro Abs: 1.9 10*3/uL (ref 1.7–7.7)
Neutrophils Relative %: 45 % (ref 43–77)
Platelets: 254 10*3/uL (ref 150–400)
RBC: 4.43 MIL/uL (ref 3.87–5.11)
RDW: 16 % — ABNORMAL HIGH (ref 11.5–15.5)
WBC: 4.2 10*3/uL (ref 4.0–10.5)

## 2014-02-03 LAB — URINALYSIS, ROUTINE W REFLEX MICROSCOPIC
BILIRUBIN URINE: NEGATIVE
GLUCOSE, UA: NEGATIVE mg/dL
HGB URINE DIPSTICK: NEGATIVE
Ketones, ur: NEGATIVE mg/dL
Leukocytes, UA: NEGATIVE
Nitrite: NEGATIVE
PROTEIN: NEGATIVE mg/dL
Specific Gravity, Urine: 1.016 (ref 1.005–1.030)
Urobilinogen, UA: 0.2 mg/dL (ref 0.0–1.0)
pH: 5 (ref 5.0–8.0)

## 2014-02-03 LAB — LIPASE, BLOOD: LIPASE: 36 U/L (ref 11–59)

## 2014-02-03 LAB — I-STAT TROPONIN, ED: Troponin i, poc: 0 ng/mL (ref 0.00–0.08)

## 2014-02-03 MED ORDER — IOHEXOL 300 MG/ML  SOLN
25.0000 mL | INTRAMUSCULAR | Status: AC
  Administered 2014-02-03: 25 mL via ORAL

## 2014-02-03 MED ORDER — METOCLOPRAMIDE HCL 5 MG/ML IJ SOLN
10.0000 mg | Freq: Once | INTRAMUSCULAR | Status: AC
  Administered 2014-02-03: 10 mg via INTRAVENOUS
  Filled 2014-02-03: qty 2

## 2014-02-03 MED ORDER — IOHEXOL 300 MG/ML  SOLN
80.0000 mL | Freq: Once | INTRAMUSCULAR | Status: AC | PRN
  Administered 2014-02-03: 80 mL via INTRAVENOUS

## 2014-02-03 MED ORDER — DICYCLOMINE HCL 20 MG PO TABS: 20.0000 mg | ORAL_TABLET | Freq: Two times a day (BID) | ORAL | Status: DC

## 2014-02-03 MED ORDER — HYDROCODONE-ACETAMINOPHEN 5-325 MG PO TABS: 1.0000 | ORAL_TABLET | Freq: Four times a day (QID) | ORAL | Status: DC | PRN

## 2014-02-03 MED ORDER — HYDROMORPHONE HCL PF 1 MG/ML IJ SOLN
1.0000 mg | Freq: Once | INTRAMUSCULAR | Status: AC
  Administered 2014-02-03: 1 mg via INTRAVENOUS
  Filled 2014-02-03: qty 1

## 2014-02-03 MED ORDER — ONDANSETRON HCL 4 MG/2ML IJ SOLN: 4.0000 mg | Freq: Once | INTRAMUSCULAR | Status: DC

## 2014-02-03 MED ORDER — ONDANSETRON 4 MG PO TBDP: 4.0000 mg | ORAL_TABLET | Freq: Three times a day (TID) | ORAL | Status: DC | PRN

## 2014-02-03 MED ORDER — METOCLOPRAMIDE HCL 10 MG PO TABS: 10.0000 mg | ORAL_TABLET | Freq: Four times a day (QID) | ORAL | Status: DC

## 2014-02-03 NOTE — ED Provider Notes (Signed)
CSN: [id number]     Arrival date & time 02/03/14  2458 History   First MD Initiated Contact with Patient 02/03/14 [phone]     Chief Complaint  Patient presents with  . Abdominal Pain   (Consider location/radiation/quality/duration/timing/severity/associated sxs/prior Treatment) HPI Comments: 43 year old female with history of gastric bypass, cholecystectomy, hernias, pancreatitis, sigmoid volvulus, bowel obstructions, IBS, and chronic abdominal pain presents complaining of severe abdominal pain. She has pain in the epigastrium and left upper quadrant that is severe, 10 out of 10, worse with any movement. She has also been constipated but had a bowel movement yesterday. She normally takes chronic pain medicines for her abdominal pain but she ran out of all of these, and now the pain is getting worse. She says the pain is in a slightly different spot than usual and is more severe than usual. She has nausea but no vomiting. Denies any other symptoms. She thinks she just needs refills of her pain medications.   Past Medical History  Diagnosis Date  . Hernia   . IBS (irritable bowel syndrome)   . Ulcer   . Thyroid disease   . PIH (pregnancy induced hypertension)   . Asthma   . Gastroparesis   . Pancreatitis   . Partial bowel obstruction    Past Surgical History  Procedure Laterality Date  . Gastric bypass    . Cholecystectomy    . Tubal ligation    . Endometrial ablation     History reviewed. No pertinent family history. History  Substance Use Topics  . Smoking status: Never Smoker   . Smokeless tobacco: Not on file  . Alcohol Use: No   OB History   Grav Para Term Preterm Abortions TAB SAB Ect Mult Living                 Review of Systems  Constitutional: Negative for fever, chills and fatigue.  Gastrointestinal: Positive for nausea, abdominal pain and constipation. Negative for vomiting.  All other systems reviewed and are negative.   Allergies  Amoxicillin; Doxycycline;  Morphine; Penicillins; Salmon; and Moxifloxacin  Home Medications   Prior to Admission medications   Medication Sig Start Date End Date Taking? Authorizing Provider  albuterol (PROVENTIL HFA;VENTOLIN HFA) 108 (90 BASE) MCG/ACT inhaler Inhale 1-2 puffs into the lungs every 6 (six) hours as needed for wheezing or shortness of breath.    Historical Provider, MD  albuterol (PROVENTIL) (2.5 MG/3ML) 0.083% nebulizer solution Take 2.5 mg by nebulization every 6 (six) hours as needed for wheezing or shortness of breath.    Historical Provider, MD  amphetamine-dextroamphetamine (ADDERALL) 20 MG tablet Take 20 mg by mouth 3 (three) times daily.    Historical Provider, MD  clonazePAM (KLONOPIN) 1 MG tablet Take 1 mg by mouth 2 (two) times daily as needed for anxiety.    Historical Provider, MD  dicyclomine (BENTYL) 20 MG tablet Take 20 mg by mouth 4 (four) times daily.    Historical Provider, MD  ferrous sulfate dried (SLOW FE) 160 (50 FE) MG TBCR Take 160 mg by mouth 3 (three) times daily with meals.     Historical Provider, MD  Fiber CAPS Take 3 capsules by mouth daily.    Historical Provider, MD  fluticasone (FLONASE) 50 MCG/ACT nasal spray Place 2 sprays into both nostrils 2 (two) times daily.    Historical Provider, MD  gabapentin (NEURONTIN) 300 MG capsule Take 300 mg by mouth 2 (two) times daily.    Historical Provider, MD  ibuprofen (  ADVIL,MOTRIN) 200 MG tablet Take 600 mg by mouth every 6 (six) hours as needed for mild pain.    Historical Provider, MD  metoCLOPramide (REGLAN) 10 MG tablet Take 10 mg by mouth every 8 (eight) hours as needed for nausea or vomiting.    Historical Provider, MD  mirtazapine (REMERON) 45 MG tablet Take 45 mg by mouth at bedtime.    Historical Provider, MD  montelukast (SINGULAIR) 10 MG tablet Take 10 mg by mouth at bedtime as needed (for allergies/asthma).    Historical Provider, MD  multivitamin [facility]) per tablet Take 1 tablet by mouth daily.      Historical  Provider, MD  mupirocin cream (BACTROBAN) 2 % Apply 1 application topically 2 (two) times daily. 01/26/14   Fransico Meadow, PA-C  ondansetron (ZOFRAN ODT) 4 MG disintegrating tablet Take 1 tablet (4 mg total) by mouth every 8 (eight) hours as needed for nausea or vomiting. 01/26/14   Fransico Meadow, PA-C  ondansetron (ZOFRAN) 8 MG tablet Take 8 mg by mouth every 8 (eight) hours as needed for nausea or vomiting.    Historical Provider, MD  pantoprazole (PROTONIX) 40 MG tablet Take 40 mg by mouth daily.    Historical Provider, MD  pregabalin (LYRICA) 25 MG capsule Take 25 mg by mouth 3 (three) times daily.    Historical Provider, MD  promethazine (PHENERGAN) 25 MG tablet Take 25 mg by mouth every 6 (six) hours as needed for nausea or vomiting.    Historical Provider, MD   BP 148/104  Pulse 104  Temp(Src) 98.6 F (37 C) (Oral)  Resp 16  SpO2 98% Physical Exam  Nursing note and vitals reviewed. Constitutional: She is oriented to person, place, and time. Vital signs are normal. She appears well-developed and well-nourished. No distress.  HENT:  Head: Normocephalic and atraumatic.  Cardiovascular: Regular rhythm, normal heart sounds and normal pulses.  Tachycardia present.   Pulmonary/Chest: Effort normal and breath sounds normal. No respiratory distress.  Abdominal: Normal appearance and bowel sounds are normal. There is no hepatosplenomegaly. There is tenderness in the epigastric area and left upper quadrant. There is guarding. There is no rigidity, no rebound, no tenderness at McBurney's point and negative Murphy's sign.  Neurological: She is alert and oriented to person, place, and time. She has normal strength. Coordination normal.  Skin: Skin is warm and dry. No rash noted. She is not diaphoretic.  Psychiatric: She has a normal mood and affect. Judgment normal.    ED Course  Procedures (including critical care time) Labs Review Labs Reviewed - No data to display  Imaging Review No  results found.   MDM   1. Epigastric pain    Severe abdominal pain, with tenderness, Hx of pancreatitis and obstructions, and volvulus, transferred to ED for thorough eval     Liam Graham, PA-C 02/03/14 1014

## 2014-02-03 NOTE — ED Notes (Signed)
PA at bedside.

## 2014-02-03 NOTE — ED Notes (Signed)
Patient transported to CT 

## 2014-02-03 NOTE — ED Notes (Signed)
Pt here for mid left abd pain with hx of pancreatitis, gastric bypass and obstructions. Started a couple weeks ago and is in between PCP's. Wasn't able to get in until mid sept. Nausea, vomiting, no diarrhea. Does have IBS and feels more constipated.

## 2014-02-03 NOTE — ED Provider Notes (Signed)
CSN: [id number]     Arrival date & time 02/03/14  1013 History   First MD Initiated Contact with Patient 02/03/14 1043     Chief Complaint  Patient presents with  . Abdominal Pain     (Consider location/radiation/quality/duration/timing/severity/associated sxs/prior Treatment) HPI Comments: Patient with a history of gastric bypass, cholecystectomy, hernias, pancreatitis, sigmoid volvulus, bowel obstructions, IBS, and chronic abdominal pain presents today with a chief complaint of abdominal pain.  She reports that her pain came on acutely approximately 1 hour prior to arrival.  Pain located in the LUQ and LLQ.  She states that the pain is more severe than the pain that she has had in the past.  She describes the pain as a "sharp, stabbing pain."  Pain does not radiate.  She reports that the pain is associated with nausea and vomiting.  She denies urinary symptoms.  Denies fever or chills.  She states that she has been constipated.  Last BM was yesterday morning.  She denies diarrhea.  She reports that she does have a history of Chronic Abdominal pain, but feels that this pain feels different than the chronic pain she has had in the past.  She reports that she has been on Hydrocodone in the past for pain, but ran out of the medication one week ago.  The history is provided by the patient.    Past Medical History  Diagnosis Date  . Hernia   . IBS (irritable bowel syndrome)   . Ulcer   . Thyroid disease   . PIH (pregnancy induced hypertension)   . Asthma   . Gastroparesis   . Pancreatitis   . Partial bowel obstruction   . GERD (gastroesophageal reflux disease)    Past Surgical History  Procedure Laterality Date  . Gastric bypass    . Cholecystectomy    . Tubal ligation    . Endometrial ablation     No family history on file. History  Substance Use Topics  . Smoking status: Never Smoker   . Smokeless tobacco: Not on file  . Alcohol Use: No   OB History   Grav Para Term Preterm  Abortions TAB SAB Ect Mult Living                 Review of Systems  All other systems reviewed and are negative.     Allergies  Amoxicillin; Doxycycline; Morphine; Penicillins; Salmon; and Moxifloxacin  Home Medications   Prior to Admission medications   Medication Sig Start Date End Date Taking? Authorizing Provider  albuterol (PROVENTIL HFA;VENTOLIN HFA) 108 (90 BASE) MCG/ACT inhaler Inhale 1-2 puffs into the lungs every 6 (six) hours as needed for wheezing or shortness of breath.    Historical Provider, MD  albuterol (PROVENTIL) (2.5 MG/3ML) 0.083% nebulizer solution Take 2.5 mg by nebulization every 6 (six) hours as needed for wheezing or shortness of breath.    Historical Provider, MD  amphetamine-dextroamphetamine (ADDERALL) 20 MG tablet Take 20 mg by mouth 3 (three) times daily.    Historical Provider, MD  clonazePAM (KLONOPIN) 1 MG tablet Take 1 mg by mouth 2 (two) times daily as needed for anxiety.    Historical Provider, MD  dicyclomine (BENTYL) 20 MG tablet Take 20 mg by mouth 4 (four) times daily.    Historical Provider, MD  ferrous sulfate dried (SLOW FE) 160 (50 FE) MG TBCR Take 160 mg by mouth 3 (three) times daily with meals.     Historical Provider, MD  Fiber CAPS Take  3 capsules by mouth daily.    Historical Provider, MD  fluticasone (FLONASE) 50 MCG/ACT nasal spray Place 2 sprays into both nostrils 2 (two) times daily.    Historical Provider, MD  gabapentin (NEURONTIN) 300 MG capsule Take 300 mg by mouth 2 (two) times daily.    Historical Provider, MD  ibuprofen (ADVIL,MOTRIN) 200 MG tablet Take 600 mg by mouth every 6 (six) hours as needed for mild pain.    Historical Provider, MD  metoCLOPramide (REGLAN) 10 MG tablet Take 10 mg by mouth every 8 (eight) hours as needed for nausea or vomiting.    Historical Provider, MD  mirtazapine (REMERON) 45 MG tablet Take 45 mg by mouth at bedtime.    Historical Provider, MD  montelukast (SINGULAIR) 10 MG tablet Take 10 mg by  mouth at bedtime as needed (for allergies/asthma).    Historical Provider, MD  multivitamin [facility]) per tablet Take 1 tablet by mouth daily.      Historical Provider, MD  mupirocin cream (BACTROBAN) 2 % Apply 1 application topically 2 (two) times daily. 01/26/14   Fransico Meadow, PA-C  ondansetron (ZOFRAN ODT) 4 MG disintegrating tablet Take 1 tablet (4 mg total) by mouth every 8 (eight) hours as needed for nausea or vomiting. 01/26/14   Fransico Meadow, PA-C  ondansetron (ZOFRAN) 8 MG tablet Take 8 mg by mouth every 8 (eight) hours as needed for nausea or vomiting.    Historical Provider, MD  pantoprazole (PROTONIX) 40 MG tablet Take 40 mg by mouth daily.    Historical Provider, MD  pregabalin (LYRICA) 25 MG capsule Take 25 mg by mouth 3 (three) times daily.    Historical Provider, MD  promethazine (PHENERGAN) 25 MG tablet Take 25 mg by mouth every 6 (six) hours as needed for nausea or vomiting.    Historical Provider, MD   BP 124/82  Pulse 98  Temp(Src) 98 F (36.7 C) (Oral)  Resp 18  SpO2 100% Physical Exam  Nursing note and vitals reviewed. Constitutional: She appears well-developed and well-nourished.  HENT:  Head: Normocephalic and atraumatic.  Mouth/Throat: Oropharynx is clear and moist.  Eyes: EOM are normal.  Neck: Normal range of motion. Neck supple.  Cardiovascular: Normal rate, regular rhythm and normal heart sounds.   Pulmonary/Chest: Effort normal and breath sounds normal.  Abdominal: Soft. Bowel sounds are normal. She exhibits no distension and no mass. There is tenderness in the epigastric area, left upper quadrant and left lower quadrant. There is no rebound and no guarding.  Musculoskeletal: Normal range of motion.  Neurological: She is alert.  Skin: Skin is warm and dry.  Psychiatric: She has a normal mood and affect.    ED Course  Procedures (including critical care time) Labs Review Labs Reviewed  CBC WITH DIFFERENTIAL  COMPREHENSIVE METABOLIC PANEL   LIPASE, BLOOD  URINALYSIS, ROUTINE W REFLEX MICROSCOPIC  I-STAT TROPOININ, ED    Imaging Review Ct Abdomen Pelvis W Contrast  02/03/2014   CLINICAL DATA:  43 year old female with abdominal and pelvic pain. History of pancreatitis, gastric bypass, cholecystectomy and prior small bowel obstructions.  EXAM: CT ABDOMEN AND PELVIS WITH CONTRAST  TECHNIQUE: Multidetector CT imaging of the abdomen and pelvis was performed using the standard protocol following bolus administration of intravenous contrast.  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  The liver, spleen, pancreas,  adrenal glands and kidneys are unremarkable.  The patient is status post gastric bypass and cholecystectomy.  There is no evidence of bowel obstruction, pneumoperitoneum, bowel wall thickening or abscess.  No free-fluid, enlarged lymph nodes or abdominal aortic aneurysm identified.  The uterus, adnexal regions and bladder are unremarkable.  No acute or suspicious bony abnormalities are identified.  IMPRESSION: No evidence of acute abnormality.  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  Gastric bypass changes without definite complicating features or bowel obstruction.   Electronically Signed   By: Hassan Rowan M.D.   On: 02/03/2014 13:27     EKG Interpretation None     1:45 PM Discussed results of the CT with the patient.  She reports that her pain and nausea has significantly improved at this time.  MDM   Final diagnoses:  None   Patient with a history of Gastric Bypass, cholecystectomy, hernias, pancreatitis, sigmoid volvulus, bowel obstructions, IBS, and chronic abdominal pain presents today with abdominal pain.  She reports that her pain today is different than her chronic pain.  Labs unremarkable.  CT showing no evidence of acute  abnormality.  Symptoms improved during ED course.  Patient able to tolerate PO liquids.  Patient stable for discharge.  Return precautions given.    Hyman Bible, PA-C 02/04/14 [phone]

## 2014-02-03 NOTE — ED Notes (Signed)
Cannot see her new MD until mid September, in ED a couple of weeks ago out of medications

## 2014-02-03 NOTE — ED Provider Notes (Signed)
Medical screening examination/treatment/procedure(s) were performed by non-physician practitioner and as supervising physician I was immediately available for consultation/collaboration.  Philipp Deputy, M.D.  Harden Mo, MD 02/03/14 [phone]

## 2014-02-07 NOTE — ED Provider Notes (Signed)
Medical screening examination/treatment/procedure(s) were performed by non-physician practitioner and as supervising physician I was immediately available for consultation/collaboration.   EKG Interpretation   Date/Time:  Monday February 03 2014 10:27:14 EDT Ventricular Rate:  101 PR Interval:  138 QRS Duration: 98 QT Interval:  324 QTC Calculation: 420 R Axis:   94 Text Interpretation:  Sinus tachycardia Rightward axis Borderline ECG ED  PHYSICIAN INTERPRETATION AVAILABLE IN [facility] Confirmed by TEST,  Record ([id number]) on 02/05/2014 6:53:57 AM       Varney Biles, MD 02/07/14 0349

## 2014-02-15 ENCOUNTER — Encounter (HOSPITAL_COMMUNITY): Payer: Self-pay | Admitting: Emergency Medicine

## 2014-02-15 ENCOUNTER — Emergency Department (HOSPITAL_COMMUNITY): Payer: Medicaid Other

## 2014-02-15 ENCOUNTER — Emergency Department (HOSPITAL_COMMUNITY)
Admission: EM | Admit: 2014-02-15 | Discharge: 2014-02-15 | Disposition: A | Discharge status: Home / Self Care | Payer: Medicaid Other | Source: Home / Self Care

## 2014-02-15 ENCOUNTER — Emergency Department (HOSPITAL_COMMUNITY)
Admission: EM | Admit: 2014-02-15 | Discharge: 2014-02-15 | Disposition: A | Discharge status: Home / Self Care | Payer: Medicaid Other | Attending: Emergency Medicine | Admitting: Emergency Medicine

## 2014-02-15 DIAGNOSIS — Z862 Personal history of diseases of the blood and blood-forming organs and certain disorders involving the immune mechanism: Secondary | ICD-10-CM | POA: Insufficient documentation

## 2014-02-15 DIAGNOSIS — R4182 Altered mental status, unspecified: Secondary | ICD-10-CM

## 2014-02-15 DIAGNOSIS — IMO0002 Reserved for concepts with insufficient information to code with codable children: Secondary | ICD-10-CM | POA: Insufficient documentation

## 2014-02-15 DIAGNOSIS — R443 Hallucinations, unspecified: Secondary | ICD-10-CM | POA: Insufficient documentation

## 2014-02-15 DIAGNOSIS — K219 Gastro-esophageal reflux disease without esophagitis: Secondary | ICD-10-CM | POA: Insufficient documentation

## 2014-02-15 DIAGNOSIS — Z880 Allergy status to penicillin: Secondary | ICD-10-CM | POA: Diagnosis not present

## 2014-02-15 DIAGNOSIS — T50905A Adverse effect of unspecified drugs, medicaments and biological substances, initial encounter: Secondary | ICD-10-CM

## 2014-02-15 DIAGNOSIS — Z792 Long term (current) use of antibiotics: Secondary | ICD-10-CM | POA: Diagnosis not present

## 2014-02-15 DIAGNOSIS — H5316 Psychophysical visual disturbances: Secondary | ICD-10-CM | POA: Diagnosis not present

## 2014-02-15 DIAGNOSIS — T4275XA Adverse effect of unspecified antiepileptic and sedative-hypnotic drugs, initial encounter: Secondary | ICD-10-CM | POA: Insufficient documentation

## 2014-02-15 DIAGNOSIS — Z79899 Other long term (current) drug therapy: Secondary | ICD-10-CM | POA: Diagnosis not present

## 2014-02-15 DIAGNOSIS — Z872 Personal history of diseases of the skin and subcutaneous tissue: Secondary | ICD-10-CM | POA: Diagnosis not present

## 2014-02-15 DIAGNOSIS — J45909 Unspecified asthma, uncomplicated: Secondary | ICD-10-CM | POA: Insufficient documentation

## 2014-02-15 DIAGNOSIS — Z8639 Personal history of other endocrine, nutritional and metabolic disease: Secondary | ICD-10-CM | POA: Insufficient documentation

## 2014-02-15 DIAGNOSIS — R441 Visual hallucinations: Secondary | ICD-10-CM

## 2014-02-15 LAB — COMPREHENSIVE METABOLIC PANEL
ALBUMIN: 3.2 g/dL — AB (ref 3.5–5.2)
ALT: 17 U/L (ref 0–35)
AST: 27 U/L (ref 0–37)
Alkaline Phosphatase: 80 U/L (ref 39–117)
Anion gap: 8 (ref 5–15)
BUN: 15 mg/dL (ref 6–23)
CALCIUM: 8.6 mg/dL (ref 8.4–10.5)
CO2: 25 mEq/L (ref 19–32)
Chloride: 101 mEq/L (ref 96–112)
Creatinine, Ser: 0.58 mg/dL (ref 0.50–1.10)
GFR calc non Af Amer: >90 mL/min (ref >90)
GLUCOSE: 99 mg/dL (ref 70–99)
Potassium: 4.1 mEq/L (ref 3.7–5.3)
SODIUM: 134 meq/L — AB (ref 137–147)
Total Bilirubin: <0.2 mg/dL — ABNORMAL LOW (ref 0.3–1.2)
Total Protein: 6.1 g/dL (ref 6.0–8.3)

## 2014-02-15 LAB — CBC
HEMATOCRIT: 33.6 % — AB (ref 36.0–46.0)
HEMOGLOBIN: 10.9 g/dL — AB (ref 12.0–15.0)
MCH: 26.9 pg (ref 26.0–34.0)
MCHC: 32.4 g/dL (ref 30.0–36.0)
MCV: 83 fL (ref 78.0–100.0)
Platelets: 270 10*3/uL (ref 150–400)
RBC: 4.05 MIL/uL (ref 3.87–5.11)
RDW: 15.2 % (ref 11.5–15.5)
WBC: 5 10*3/uL (ref 4.0–10.5)

## 2014-02-15 LAB — URINALYSIS, ROUTINE W REFLEX MICROSCOPIC
Bilirubin Urine: NEGATIVE
GLUCOSE, UA: NEGATIVE mg/dL
HGB URINE DIPSTICK: NEGATIVE
Ketones, ur: NEGATIVE mg/dL
Leukocytes, UA: NEGATIVE
Nitrite: NEGATIVE
Protein, ur: NEGATIVE mg/dL
SPECIFIC GRAVITY, URINE: 1.024 (ref 1.005–1.030)
UROBILINOGEN UA: 0.2 mg/dL (ref 0.0–1.0)
pH: 5 (ref 5.0–8.0)

## 2014-02-15 LAB — SALICYLATE LEVEL

## 2014-02-15 LAB — RAPID URINE DRUG SCREEN, HOSP PERFORMED
Amphetamines: NOT DETECTED
Barbiturates: NOT DETECTED
Benzodiazepines: NOT DETECTED
Cocaine: NOT DETECTED
OPIATES: POSITIVE — AB
Tetrahydrocannabinol: NOT DETECTED

## 2014-02-15 LAB — ACETAMINOPHEN LEVEL: Acetaminophen (Tylenol), Serum: <15 ug/mL (ref 10–30)

## 2014-02-15 LAB — ETHANOL: Alcohol, Ethyl (B): <11 mg/dL (ref 0–11)

## 2014-02-15 NOTE — Discharge Instructions (Signed)
Go back to the old dose of the Lyrica. Followup with her doctors. Return for worsening symptoms.

## 2014-02-15 NOTE — ED Provider Notes (Signed)
CSN: [id number]     Arrival date & time 02/15/14  1440 History   None    Chief Complaint  Patient presents with  . Rash   (Consider location/radiation/quality/duration/timing/severity/associated sxs/prior Treatment) Patient is a 43 y.o. female presenting with rash. The history is provided by the patient.  Rash Location:  Face and head/neck Head/neck rash location:  Scalp and R neck Quality: scaling   Severity:  Mild Onset quality:  Gradual Duration:  6 days Progression:  Worsening Chronicity:  New Context comment:  Seeing ants on floor at home and at [facility], falling asleep at restaraunt last eve, episodes of confusion and disorientation. 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   Past Medical History  Diagnosis Date  . Hernia   . IBS (irritable bowel syndrome)   . Ulcer   . Thyroid disease   . PIH (pregnancy induced hypertension)   . Asthma   . Gastroparesis   . Pancreatitis   . Partial bowel obstruction   . GERD (gastroesophageal reflux disease)    Past Surgical History  Procedure Laterality Date  . Gastric bypass    . Cholecystectomy    . Tubal ligation    . Endometrial ablation     History reviewed. No pertinent family history. History  Substance Use Topics  . Smoking status: Never Smoker   . Smokeless tobacco: Not on file  . Alcohol Use: No   OB History   Grav Para Term Preterm Abortions TAB SAB Ect Mult Living                 Review of Systems  Constitutional: Positive for fever.  Skin: Positive for rash.    Allergies  Amoxicillin; Doxycycline; Morphine; Penicillins; Salmon; and Moxifloxacin  Home Medications   Prior to Admission medications   Medication Sig Start Date End Date Taking? Authorizing Provider  amphetamine-dextroamphetamine (ADDERALL) 20 MG tablet Take 20 mg by mouth 3 (three) times daily.   Yes Historical Provider, MD  clonazePAM (KLONOPIN) 1 MG tablet Take  1 mg by mouth 2 (two) times daily as needed for anxiety.   Yes Historical Provider, MD  dicyclomine (BENTYL) 20 MG tablet Take 20 mg by mouth 4 (four) times daily.   Yes Historical Provider, MD  dicyclomine (BENTYL) 20 MG tablet Take 1 tablet (20 mg total) by mouth 2 (two) times daily. 02/03/14  Yes Heather Laisure, PA-C  ferrous sulfate dried (SLOW FE) 160 (50 FE) MG TBCR Take 160 mg by mouth 3 (three) times daily with meals.    Yes Historical Provider, MD  Fiber CAPS Take 3 capsules by mouth daily.   Yes Historical Provider, MD  fluticasone (FLONASE) 50 MCG/ACT nasal spray Place 2 sprays into both nostrils 2 (two) times daily.   Yes Historical Provider, MD  gabapentin (NEURONTIN) 300 MG capsule Take 300 mg by mouth 2 (two) times daily.   Yes Historical Provider, MD  mirtazapine (REMERON) 45 MG tablet Take 45 mg by mouth at bedtime.   Yes Historical Provider, MD  ondansetron (ZOFRAN ODT) 4 MG disintegrating tablet Take 1 tablet (4 mg total) by mouth every 8 (eight) hours as needed for nausea or vomiting. 02/03/14  Yes Heather Laisure, PA-C  ondansetron (ZOFRAN) 8 MG tablet Take 8 mg by mouth every 8 (eight) hours as needed for nausea or vomiting.   Yes Historical Provider, MD  pantoprazole (PROTONIX) 40 MG tablet Take 40 mg by mouth daily.  Yes Historical Provider, MD  pregabalin (LYRICA) 25 MG capsule Take 25 mg by mouth 3 (three) times daily.   Yes Historical Provider, MD  albuterol (PROVENTIL HFA;VENTOLIN HFA) 108 (90 BASE) MCG/ACT inhaler Inhale 1-2 puffs into the lungs every 6 (six) hours as needed for wheezing or shortness of breath.    Historical Provider, MD  albuterol (PROVENTIL) (2.5 MG/3ML) 0.083% nebulizer solution Take 2.5 mg by nebulization every 6 (six) hours as needed for wheezing or shortness of breath.    Historical Provider, MD  HYDROcodone-acetaminophen (NORCO/VICODIN) 5-325 MG per tablet Take 1-2 tablets by mouth every 6 (six) hours as needed. 02/03/14   Heather Laisure, PA-C   ibuprofen (ADVIL,MOTRIN) 200 MG tablet Take 600 mg by mouth every 6 (six) hours as needed for mild pain.    Historical Provider, MD  metoCLOPramide (REGLAN) 10 MG tablet Take 10 mg by mouth every 8 (eight) hours as needed for nausea or vomiting.    Historical Provider, MD  metoCLOPramide (REGLAN) 10 MG tablet Take 1 tablet (10 mg total) by mouth every 6 (six) hours. 02/03/14   Heather Laisure, PA-C  montelukast (SINGULAIR) 10 MG tablet Take 10 mg by mouth at bedtime as needed (for allergies/asthma).    Historical Provider, MD  multivitamin [facility]) per tablet Take 1 tablet by mouth daily.      Historical Provider, MD  mupirocin cream (BACTROBAN) 2 % Apply 1 application topically 2 (two) times daily. 01/26/14   Fransico Meadow, PA-C  ondansetron (ZOFRAN ODT) 4 MG disintegrating tablet Take 1 tablet (4 mg total) by mouth every 8 (eight) hours as needed for nausea or vomiting. 01/26/14   Fransico Meadow, PA-C  promethazine (PHENERGAN) 25 MG tablet Take 25 mg by mouth every 6 (six) hours as needed for nausea or vomiting.    Historical Provider, MD   BP 148/100  Pulse 99  Temp(Src) 98.7 F (37.1 C) (Oral)  Resp 16  SpO2 100% Physical Exam  Nursing note and vitals reviewed. Constitutional: She is oriented to person, place, and time. She appears well-developed and well-nourished. She appears distressed.  Neck: Normal range of motion. Neck supple.  Cardiovascular: Normal heart sounds.   Pulmonary/Chest: Breath sounds normal.  Lymphadenopathy:    She has no cervical adenopathy.  Neurological: She is alert and oriented to person, place, and time. No cranial nerve deficit.  Skin: Skin is warm and dry. Rash noted.  right facial excoriations.    ED Course  Procedures (including critical care time) Labs Review Labs Reviewed - No data to display  Imaging Review No results found.   MDM   1. Altered mental status, unspecified altered mental status type    Sent for visual hallucinations,  concern about mrsa in brain , reported fever.    Billy Fischer, MD 02/15/14 [phone]

## 2014-02-15 NOTE — ED Notes (Signed)
Pt transported to CT ?

## 2014-02-15 NOTE — ED Notes (Signed)
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 Pt reports talking incoherently yesterday and seeing things that are not there. Pt reports that her family told her that she was talking crazy, and falling asleep in the restaurant. Today pt thought dust on the ground was ants crawling and electric cords where snakes crawling up the wall.

## 2014-02-15 NOTE — ED Provider Notes (Signed)
CSN: [id number]     Arrival date & time 02/15/14  1541 History   First MD Initiated Contact with Patient 02/15/14 1749     Chief Complaint  Patient presents with  . Hallucinations     (Consider location/radiation/quality/duration/timing/severity/associated sxs/prior Treatment) The history is provided by the patient.   patient's been having visual hallucinations for last couple days. States she saw some what she thought were in some floor. There was some dirt. She also sees some blurring are wiggling around the edges of things. No auditory hallucinations. She's had no headache. She's had a recent change of an increase in her Lyrica and decrease in her pain medicines. The symptoms began a few days after that. No numbness or weakness. No abdominal pain. She states she has chronic pain. No previous history of hallucinations.  Past Medical History  Diagnosis Date  . Hernia   . IBS (irritable bowel syndrome)   . Ulcer   . Thyroid disease   . PIH (pregnancy induced hypertension)   . Asthma   . Gastroparesis   . Pancreatitis   . Partial bowel obstruction   . GERD (gastroesophageal reflux disease)    Past Surgical History  Procedure Laterality Date  . Gastric bypass    . Cholecystectomy    . Tubal ligation    . Endometrial ablation     No family history on file. History  Substance Use Topics  . Smoking status: Never Smoker   . Smokeless tobacco: Not on file  . Alcohol Use: No   OB History   Grav Para Term Preterm Abortions TAB SAB Ect Mult Living                 Review of Systems  Constitutional: Negative for activity change and appetite change.  Eyes: Negative for pain.  Respiratory: Negative for chest tightness and shortness of breath.   Cardiovascular: Negative for chest pain and leg swelling.  Gastrointestinal: Negative for nausea, vomiting, abdominal pain and diarrhea.  Genitourinary: Negative for flank pain.  Musculoskeletal: Negative for back pain and neck stiffness.   Skin: Negative for rash.  Neurological: Negative for weakness, numbness and headaches.  Psychiatric/Behavioral: Positive for hallucinations. Negative for behavioral problems.      Allergies  Amoxicillin; Doxycycline; Morphine; Penicillins; Salmon; and Moxifloxacin  Home Medications   Prior to Admission medications   Medication Sig Start Date End Date Taking? Authorizing Provider  albuterol (PROVENTIL HFA;VENTOLIN HFA) 108 (90 BASE) MCG/ACT inhaler Inhale 1-2 puffs into the lungs every 6 (six) hours as needed for wheezing or shortness of breath.    Historical Provider, MD  albuterol (PROVENTIL) (2.5 MG/3ML) 0.083% nebulizer solution Take 2.5 mg by nebulization every 6 (six) hours as needed for wheezing or shortness of breath.    Historical Provider, MD  amphetamine-dextroamphetamine (ADDERALL) 20 MG tablet Take 20 mg by mouth 3 (three) times daily.    Historical Provider, MD  clonazePAM (KLONOPIN) 1 MG tablet Take 1 mg by mouth 2 (two) times daily as needed for anxiety.    Historical Provider, MD  dicyclomine (BENTYL) 20 MG tablet Take 20 mg by mouth 4 (four) times daily.    Historical Provider, MD  dicyclomine (BENTYL) 20 MG tablet Take 1 tablet (20 mg total) by mouth 2 (two) times daily. 02/03/14   Hyman Bible, PA-C  ferrous sulfate dried (SLOW FE) 160 (50 FE) MG TBCR Take 160 mg by mouth 3 (three) times daily with meals.     Historical Provider, MD  Fiber  CAPS Take 3 capsules by mouth daily.    Historical Provider, MD  fluticasone (FLONASE) 50 MCG/ACT nasal spray Place 2 sprays into both nostrils 2 (two) times daily.    Historical Provider, MD  gabapentin (NEURONTIN) 300 MG capsule Take 300 mg by mouth 2 (two) times daily.    Historical Provider, MD  HYDROcodone-acetaminophen (NORCO/VICODIN) 5-325 MG per tablet Take 1-2 tablets by mouth every 6 (six) hours as needed. 02/03/14   Heather Laisure, PA-C  ibuprofen (ADVIL,MOTRIN) 200 MG tablet Take 600 mg by mouth every 6 (six) hours as  needed for mild pain.    Historical Provider, MD  metoCLOPramide (REGLAN) 10 MG tablet Take 10 mg by mouth every 8 (eight) hours as needed for nausea or vomiting.    Historical Provider, MD  metoCLOPramide (REGLAN) 10 MG tablet Take 1 tablet (10 mg total) by mouth every 6 (six) hours. 02/03/14   Heather Laisure, PA-C  mirtazapine (REMERON) 45 MG tablet Take 45 mg by mouth at bedtime.    Historical Provider, MD  montelukast (SINGULAIR) 10 MG tablet Take 10 mg by mouth at bedtime as needed (for allergies/asthma).    Historical Provider, MD  multivitamin [facility]) per tablet Take 1 tablet by mouth daily.      Historical Provider, MD  mupirocin cream (BACTROBAN) 2 % Apply 1 application topically 2 (two) times daily. 01/26/14   Fransico Meadow, PA-C  ondansetron (ZOFRAN ODT) 4 MG disintegrating tablet Take 1 tablet (4 mg total) by mouth every 8 (eight) hours as needed for nausea or vomiting. 01/26/14   Fransico Meadow, PA-C  ondansetron (ZOFRAN ODT) 4 MG disintegrating tablet Take 1 tablet (4 mg total) by mouth every 8 (eight) hours as needed for nausea or vomiting. 02/03/14   Hyman Bible, PA-C  ondansetron (ZOFRAN) 8 MG tablet Take 8 mg by mouth every 8 (eight) hours as needed for nausea or vomiting.    Historical Provider, MD  pantoprazole (PROTONIX) 40 MG tablet Take 40 mg by mouth daily.    Historical Provider, MD  pregabalin (LYRICA) 25 MG capsule Take 25 mg by mouth 3 (three) times daily.    Historical Provider, MD  promethazine (PHENERGAN) 25 MG tablet Take 25 mg by mouth every 6 (six) hours as needed for nausea or vomiting.    Historical Provider, MD   BP 103/80  Pulse 88  Temp(Src) 98.9 F (37.2 C) (Oral)  Resp 12  Ht 5\' 2"  (1.575 m)  Wt 160 lb (72.576 kg)  BMI 29.26 kg/m2  SpO2 100% Physical Exam  Constitutional: She is oriented to person, place, and time. She appears well-developed and well-nourished.  HENT:  Head: Normocephalic and atraumatic.  Eyes: EOM are normal. Pupils are  equal, round, and reactive to light.  Cardiovascular: Normal rate and regular rhythm.   Pulmonary/Chest: Effort normal and breath sounds normal.  Abdominal: Soft. Bowel sounds are normal.  Musculoskeletal: Normal range of motion.  Neurological: She is alert and oriented to person, place, and time.  Skin: Skin is warm.  Psychiatric: She has a normal mood and affect.    ED Course  Procedures (including critical care time) Labs Review Labs Reviewed  CBC - Abnormal; Notable for the following:    Hemoglobin 10.9 (*)    HCT 33.6 (*)    All other components within normal limits  COMPREHENSIVE METABOLIC PANEL - Abnormal; Notable for the following:    Sodium 134 (*)    Albumin 3.2 (*)    Total Bilirubin <0.2 (*)  All other components within normal limits  SALICYLATE LEVEL - Abnormal; Notable for the following:    Salicylate Lvl <2.8 (*)    All other components within normal limits  URINE RAPID DRUG SCREEN (HOSP PERFORMED) - Abnormal; Notable for the following:    Opiates POSITIVE (*)    All other components within normal limits  URINALYSIS, ROUTINE W REFLEX MICROSCOPIC - Abnormal; Notable for the following:    Color, Urine AMBER (*)    All other components within normal limits  ACETAMINOPHEN LEVEL  ETHANOL    Imaging Review Ct Head Wo Contrast  02/15/2014   CLINICAL DATA:  Frontal headache and dizziness.  EXAM: CT HEAD WITHOUT CONTRAST  TECHNIQUE: Contiguous axial images were obtained from the base of the skull through the vertex without intravenous contrast.  COMPARISON:  None.  FINDINGS: No acute cortical infarct, hemorrhage, or mass lesion ispresent. Ventricles are of normal size. No significant extra-axial fluid collection is present. The paranasal sinuses andmastoid air cells are clear. The osseous skull is intact.  IMPRESSION: 1. No acute intracranial abnormalities.  Normal brain   Electronically Signed   By: Kerby Moors M.D.   On: 02/15/2014 18:56     EKG  Interpretation None      MDM   Final diagnoses:  Visual hallucinations  Medication adverse effect, initial encounter    Patient with hallucinations. Had recent increase in Lyrica and decreasing pain medicines. Head CT reassuring. Likely medicine related hallucinations. Will decreased Lyrica back to previous May need further workup if hallucinations continue    Jasper Riling. Alvino Chapel, MD 02/16/14 2320

## 2014-02-15 NOTE — ED Notes (Signed)
Reports rash on face and visual changes.  States"The floor is moving. 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

## 2014-03-08 ENCOUNTER — Emergency Department (HOSPITAL_BASED_OUTPATIENT_CLINIC_OR_DEPARTMENT_OTHER): Payer: Medicaid Other

## 2014-03-08 ENCOUNTER — Emergency Department (HOSPITAL_BASED_OUTPATIENT_CLINIC_OR_DEPARTMENT_OTHER)
Admission: EM | Admit: 2014-03-08 | Discharge: 2014-03-08 | Disposition: A | Discharge status: Home / Self Care | Payer: Medicaid Other | Attending: Emergency Medicine | Admitting: Emergency Medicine

## 2014-03-08 ENCOUNTER — Encounter (HOSPITAL_BASED_OUTPATIENT_CLINIC_OR_DEPARTMENT_OTHER): Payer: Self-pay | Admitting: Emergency Medicine

## 2014-03-08 DIAGNOSIS — Z9884 Bariatric surgery status: Secondary | ICD-10-CM | POA: Insufficient documentation

## 2014-03-08 DIAGNOSIS — R109 Unspecified abdominal pain: Secondary | ICD-10-CM

## 2014-03-08 DIAGNOSIS — E079 Disorder of thyroid, unspecified: Secondary | ICD-10-CM | POA: Insufficient documentation

## 2014-03-08 DIAGNOSIS — Z872 Personal history of diseases of the skin and subcutaneous tissue: Secondary | ICD-10-CM | POA: Diagnosis not present

## 2014-03-08 DIAGNOSIS — Z3202 Encounter for pregnancy test, result negative: Secondary | ICD-10-CM | POA: Insufficient documentation

## 2014-03-08 DIAGNOSIS — R1012 Left upper quadrant pain: Secondary | ICD-10-CM | POA: Insufficient documentation

## 2014-03-08 DIAGNOSIS — K219 Gastro-esophageal reflux disease without esophagitis: Secondary | ICD-10-CM | POA: Insufficient documentation

## 2014-03-08 DIAGNOSIS — Z7951 Long term (current) use of inhaled steroids: Secondary | ICD-10-CM | POA: Insufficient documentation

## 2014-03-08 DIAGNOSIS — Z9851 Tubal ligation status: Secondary | ICD-10-CM | POA: Diagnosis not present

## 2014-03-08 DIAGNOSIS — Z79899 Other long term (current) drug therapy: Secondary | ICD-10-CM | POA: Diagnosis not present

## 2014-03-08 DIAGNOSIS — R42 Dizziness and giddiness: Secondary | ICD-10-CM | POA: Insufficient documentation

## 2014-03-08 DIAGNOSIS — Z9089 Acquired absence of other organs: Secondary | ICD-10-CM | POA: Insufficient documentation

## 2014-03-08 DIAGNOSIS — K3184 Gastroparesis: Secondary | ICD-10-CM | POA: Diagnosis not present

## 2014-03-08 DIAGNOSIS — Z9889 Other specified postprocedural states: Secondary | ICD-10-CM | POA: Insufficient documentation

## 2014-03-08 DIAGNOSIS — R1013 Epigastric pain: Secondary | ICD-10-CM | POA: Insufficient documentation

## 2014-03-08 DIAGNOSIS — J45901 Unspecified asthma with (acute) exacerbation: Secondary | ICD-10-CM | POA: Insufficient documentation

## 2014-03-08 DIAGNOSIS — Z880 Allergy status to penicillin: Secondary | ICD-10-CM | POA: Insufficient documentation

## 2014-03-08 DIAGNOSIS — G8929 Other chronic pain: Secondary | ICD-10-CM | POA: Diagnosis not present

## 2014-03-08 DIAGNOSIS — R079 Chest pain, unspecified: Secondary | ICD-10-CM | POA: Diagnosis present

## 2014-03-08 LAB — URINALYSIS, ROUTINE W REFLEX MICROSCOPIC
BILIRUBIN URINE: NEGATIVE
Glucose, UA: NEGATIVE mg/dL
Hgb urine dipstick: NEGATIVE
Ketones, ur: NEGATIVE mg/dL
Leukocytes, UA: NEGATIVE
Nitrite: NEGATIVE
PH: 6.5 (ref 5.0–8.0)
Protein, ur: NEGATIVE mg/dL
SPECIFIC GRAVITY, URINE: 1.016 (ref 1.005–1.030)
UROBILINOGEN UA: 0.2 mg/dL (ref 0.0–1.0)

## 2014-03-08 LAB — COMPREHENSIVE METABOLIC PANEL
ALT: 11 U/L (ref 0–35)
AST: 14 U/L (ref 0–37)
Albumin: 3.1 g/dL — ABNORMAL LOW (ref 3.5–5.2)
Alkaline Phosphatase: 79 U/L (ref 39–117)
Anion gap: 14 (ref 5–15)
BUN: 11 mg/dL (ref 6–23)
CHLORIDE: 104 meq/L (ref 96–112)
CO2: 22 mEq/L (ref 19–32)
CREATININE: 0.6 mg/dL (ref 0.50–1.10)
Calcium: 9.6 mg/dL (ref 8.4–10.5)
GFR calc Af Amer: >90 mL/min (ref >90)
Glucose, Bld: 99 mg/dL (ref 70–99)
Potassium: 4.8 mEq/L (ref 3.7–5.3)
Sodium: 140 mEq/L (ref 137–147)
Total Protein: 6.6 g/dL (ref 6.0–8.3)

## 2014-03-08 LAB — LIPASE, BLOOD: LIPASE: 37 U/L (ref 11–59)

## 2014-03-08 LAB — CBC
HEMATOCRIT: 37.4 % (ref 36.0–46.0)
Hemoglobin: 12 g/dL (ref 12.0–15.0)
MCH: 27.3 pg (ref 26.0–34.0)
MCHC: 32.1 g/dL (ref 30.0–36.0)
MCV: 85.2 fL (ref 78.0–100.0)
PLATELETS: 315 10*3/uL (ref 150–400)
RBC: 4.39 MIL/uL (ref 3.87–5.11)
RDW: 14.9 % (ref 11.5–15.5)
WBC: 5.4 10*3/uL (ref 4.0–10.5)

## 2014-03-08 LAB — PREGNANCY, URINE: Preg Test, Ur: NEGATIVE

## 2014-03-08 LAB — TROPONIN I: Troponin I: <0.3 ng/mL (ref <0.30)

## 2014-03-08 MED ORDER — IOHEXOL 300 MG/ML  SOLN
100.0000 mL | Freq: Once | INTRAMUSCULAR | Status: AC | PRN
  Administered 2014-03-08: 100 mL via INTRAVENOUS

## 2014-03-08 MED ORDER — PROMETHAZINE HCL 25 MG PO TABS: 25.0000 mg | ORAL_TABLET | Freq: Four times a day (QID) | ORAL | Status: DC | PRN

## 2014-03-08 MED ORDER — METOCLOPRAMIDE HCL 5 MG/ML IJ SOLN
10.0000 mg | Freq: Once | INTRAMUSCULAR | Status: AC
  Administered 2014-03-08: 10 mg via INTRAVENOUS
  Filled 2014-03-08: qty 2

## 2014-03-08 MED ORDER — HYDROMORPHONE HCL 1 MG/ML IJ SOLN
1.0000 mg | Freq: Once | INTRAMUSCULAR | Status: AC
  Administered 2014-03-08: 1 mg via INTRAVENOUS
  Filled 2014-03-08: qty 1

## 2014-03-08 MED ORDER — OXYCODONE-ACETAMINOPHEN 5-325 MG PO TABS
2.0000 | ORAL_TABLET | Freq: Once | ORAL | Status: AC
  Administered 2014-03-08: 2 via ORAL
  Filled 2014-03-08: qty 2

## 2014-03-08 MED ORDER — IOHEXOL 300 MG/ML  SOLN
50.0000 mL | Freq: Once | INTRAMUSCULAR | Status: AC | PRN
  Administered 2014-03-08: 50 mL via ORAL

## 2014-03-08 MED ORDER — SODIUM CHLORIDE 0.9 % IV BOLUS (SEPSIS)
1000.0000 mL | Freq: Once | INTRAVENOUS | Status: AC
  Administered 2014-03-08: 1000 mL via INTRAVENOUS

## 2014-03-08 MED ORDER — IOHEXOL 300 MG/ML  SOLN: 100.0000 mL | Freq: Once | INTRAMUSCULAR | Status: DC | PRN

## 2014-03-08 MED ORDER — HYDROMORPHONE HCL 1 MG/ML IJ SOLN
1.0000 mg | Freq: Once | INTRAMUSCULAR | Status: AC
Start: 2014-03-08 — End: 2014-03-08
  Administered 2014-03-08: 1 mg via INTRAVENOUS
  Filled 2014-03-08: qty 1

## 2014-03-08 NOTE — ED Provider Notes (Signed)
CSN: [id number]     Arrival date & time 03/08/14  1517 History  This chart was scribed for Peggy Bucy, MD by Jeanell Sparrow, ED Scribe. This patient was seen in room MH09/MH09 and the patient's care was started at 3:48 PM.   Chief Complaint  Patient presents with  . Chest Pain   Patient is a 43 y.o. female presenting with abdominal pain. The history is provided by the patient. No language interpreter was used.  Abdominal Pain Pain location:  LUQ Pain quality: pressure   Pain radiates to:  Epigastric region Pain severity:  Moderate Onset quality:  Sudden Duration:  2 hours Timing:  Constant Progression:  Unchanged Chronicity:  New Relieved by:  None tried Worsened by:  Nothing tried Ineffective treatments:  None tried Associated symptoms: shortness of breath   Associated symptoms: no diarrhea and no fever    HPI Comments: Peggy Austin is a 43 y.o. female who presents to the Emergency Department complaining of constant moderate LUQ abdominal pain that started about an hour ago. She states that the pain radiated to her epigastric area. She describes the pain as a pressure feeling. She reports that the pain feels similar to pancreatitis. Pt reports associated , emesis, SOB, and dizziness. She states that she had one episode of emesis today. She reports that she has a hx of cholecystectomy and gastric bypass. She denies any fever or diarrhea  Past Medical History  Diagnosis Date  . Hernia   . IBS (irritable bowel syndrome)   . Ulcer   . Thyroid disease   . PIH (pregnancy induced hypertension)   . Asthma   . Gastroparesis   . Pancreatitis   . Partial bowel obstruction   . GERD (gastroesophageal reflux disease)    Past Surgical History  Procedure Laterality Date  . Gastric bypass    . Cholecystectomy    . Tubal ligation    . Endometrial ablation     History reviewed. No pertinent family history. History  Substance Use Topics  . Smoking status: Never Smoker   . Smokeless  tobacco: Not on file  . Alcohol Use: 0.5 oz/week    1 drink(s) per week     Comment: occasionally    OB History   Grav Para Term Preterm Abortions TAB SAB Ect Mult Living                 Review of Systems  Constitutional: Negative for fever.  Respiratory: Positive for shortness of breath.   Gastrointestinal: Positive for abdominal pain. Negative for diarrhea.  Neurological: Positive for dizziness.  All other systems reviewed and are negative.   Allergies  Amoxicillin; Doxycycline; Morphine; Penicillins; Salmon; and Moxifloxacin  Home Medications   Prior to Admission medications   Medication Sig Start Date End Date Taking? Authorizing Provider  albuterol (PROVENTIL HFA;VENTOLIN HFA) 108 (90 BASE) MCG/ACT inhaler Inhale 1-2 puffs into the lungs every 6 (six) hours as needed for wheezing or shortness of breath.   Yes Historical Provider, MD  albuterol (PROVENTIL) (2.5 MG/3ML) 0.083% nebulizer solution Take 2.5 mg by nebulization every 6 (six) hours as needed for wheezing or shortness of breath.   Yes Historical Provider, MD  amphetamine-dextroamphetamine (ADDERALL) 20 MG tablet Take 20 mg by mouth 3 (three) times daily.   Yes Historical Provider, MD  clonazePAM (KLONOPIN) 1 MG tablet Take 1 mg by mouth 2 (two) times daily as needed for anxiety.   Yes Historical Provider, MD  dicyclomine (BENTYL) 20 MG tablet  Take 20 mg by mouth 4 (four) times daily.   Yes Historical Provider, MD  dicyclomine (BENTYL) 20 MG tablet Take 1 tablet (20 mg total) by mouth 2 (two) times daily. 02/03/14  Yes Heather Laisure, PA-C  ferrous sulfate dried (SLOW FE) 160 (50 FE) MG TBCR Take 160 mg by mouth 3 (three) times daily with meals.    Yes Historical Provider, MD  Fiber CAPS Take 3 capsules by mouth daily.   Yes Historical Provider, MD  fluticasone (FLONASE) 50 MCG/ACT nasal spray Place 2 sprays into both nostrils 2 (two) times daily.   Yes Historical Provider, MD  gabapentin (NEURONTIN) 300 MG capsule Take  300 mg by mouth 2 (two) times daily.   Yes Historical Provider, MD  HYDROcodone-acetaminophen (NORCO/VICODIN) 5-325 MG per tablet Take 1-2 tablets by mouth every 6 (six) hours as needed. 02/03/14  Yes Heather Laisure, PA-C  ibuprofen (ADVIL,MOTRIN) 200 MG tablet Take 600 mg by mouth every 6 (six) hours as needed for mild pain.   Yes Historical Provider, MD  metoCLOPramide (REGLAN) 10 MG tablet Take 10 mg by mouth every 8 (eight) hours as needed for nausea or vomiting.   Yes Historical Provider, MD  metoCLOPramide (REGLAN) 10 MG tablet Take 1 tablet (10 mg total) by mouth every 6 (six) hours. 02/03/14  Yes Heather Laisure, PA-C  mirtazapine (REMERON) 45 MG tablet Take 45 mg by mouth at bedtime.   Yes Historical Provider, MD  montelukast (SINGULAIR) 10 MG tablet Take 10 mg by mouth at bedtime as needed (for allergies/asthma).   Yes Historical Provider, MD  multivitamin [facility]) per tablet Take 1 tablet by mouth daily.     Yes Historical Provider, MD  mupirocin cream (BACTROBAN) 2 % Apply 1 application topically 2 (two) times daily. 01/26/14  Yes Hollace Kinnier Sofia, PA-C  ondansetron (ZOFRAN ODT) 4 MG disintegrating tablet Take 1 tablet (4 mg total) by mouth every 8 (eight) hours as needed for nausea or vomiting. 01/26/14  Yes Hollace Kinnier Sofia, PA-C  ondansetron (ZOFRAN ODT) 4 MG disintegrating tablet Take 1 tablet (4 mg total) by mouth every 8 (eight) hours as needed for nausea or vomiting. 02/03/14  Yes Heather Laisure, PA-C  ondansetron (ZOFRAN) 8 MG tablet Take 8 mg by mouth every 8 (eight) hours as needed for nausea or vomiting.   Yes Historical Provider, MD  pantoprazole (PROTONIX) 40 MG tablet Take 40 mg by mouth daily.   Yes Historical Provider, MD  pregabalin (LYRICA) 25 MG capsule Take 25 mg by mouth 3 (three) times daily.   Yes Historical Provider, MD  promethazine (PHENERGAN) 25 MG tablet Take 25 mg by mouth every 6 (six) hours as needed for nausea or vomiting.   Yes Historical Provider, MD   Pulse  116  Temp(Src) 97.6 F (36.4 C) (Oral)  Resp 20  Ht 5\' 2"  (1.575 m)  Wt 165 lb (74.844 kg)  BMI 30.17 kg/m2  SpO2 96% Physical Exam  Nursing note and vitals reviewed. Constitutional: She is oriented to person, place, and time. She appears well-developed and well-nourished. No distress.  HENT:  Head: Normocephalic and atraumatic.  Eyes: EOM are normal. Pupils are equal, round, and reactive to light.  Neck: Normal range of motion. Neck supple.  Cardiovascular: Normal rate and regular rhythm.  Exam reveals no friction rub.   No murmur heard. Pulmonary/Chest: Effort normal and breath sounds normal. No respiratory distress. She has no wheezes. She has no rales.  Abdominal: Soft. She exhibits no distension. There is  tenderness (LUQ, epigastric). There is no rebound.  Musculoskeletal: Normal range of motion. She exhibits no edema.  Neurological: She is alert and oriented to person, place, and time.  Skin: She is not diaphoretic.    ED Course  Procedures (including critical care time) DIAGNOSTIC STUDIES: Oxygen Saturation is 96% on RA, normal by my interpretation.    COORDINATION OF CARE: 3:52 PM- Pt advised of plan for treatment which includes medication, radiology, and labs and pt agrees.  Labs Review Labs Reviewed  COMPREHENSIVE METABOLIC PANEL - Abnormal; Notable for the following:    Albumin 3.1 (*)    Total Bilirubin <0.2 (*)    All other components within normal limits  URINALYSIS, ROUTINE W REFLEX MICROSCOPIC - Abnormal; Notable for the following:    APPearance CLOUDY (*)    All other components within normal limits  CBC  LIPASE, BLOOD  TROPONIN I  PREGNANCY, URINE    Imaging Review Ct Abdomen Pelvis W Contrast  03/08/2014   CLINICAL DATA:  Left upper quadrant abdominal pain that began today, radiating to epigastric area with in the cysts, shortness of breath, and dizziness, history of irritable bowel syndrome, pancreatitis, partial bowel obstruction, and gastric  bypass as well as cholecystectomy and tubal  EXAM: CT ABDOMEN AND PELVIS WITH CONTRAST  TECHNIQUE: Multidetector CT imaging of the abdomen and pelvis was performed using the standard protocol following bolus administration of intravenous contrast.  CONTRAST:  114mL OMNIPAQUE IOHEXOL 300 MG/ML  SOLN  COMPARISON:  02/03/2014  FINDINGS: Visualized lung bases clear.  No acute musculoskeletal findings.  Findings of status post gastric bypass unchanged.  Intrahepatic and extrahepatic biliary dilatation to the ampulla, stable with maximal common bile duct diameter 2 cm. This is unchanged from 2012. 1 cm cyst inferior right liver stable. Status post cholecystectomy.  Spleen, pancreas, adrenal glands, and kidneys normal. Aorta iliac vessels normal.  The bladder is normal. Uterus and right ovary appear normal. 2.9 cm cystic lesion left ovary with thin peripherally enhancing wall. No free fluid.  Diverticulosis sigmoid colon. No evidence of diverticulitis. Stool throughout much of the colon consistent with moderate to significant fecal retention. Appendix normal.  IMPRESSION: Multiple chronic findings as described above. Additionally, there is evidence of constipation. There is also a 2.9 cm benign appearing left ovarian cyst.   Electronically Signed   By: Skipper Cliche M.D.   On: 03/08/2014 17:59     EKG Interpretation   Date/Time:  Saturday March 08 2014 15:31:58 EDT Ventricular Rate:  119 PR Interval:  144 QRS Duration: 94 QT Interval:  300 QTC Calculation: 422 R Axis:   98 Text Interpretation:  Sinus tachycardia Right atrial enlargement Rightward  axis Pulmonary disease pattern Similar to prior Confirmed by Mingo Amber  MD,  [facility] ([id number]) on 03/08/2014 3:42:20 PM      MDM   Final diagnoses:  Chronic abdominal pain    89F presents with abdominal pain. Hx of chronic pain, recently had lyrica increased with auditory hallucinations. Patient states worsening of her LUQ chronic pain and new central  epigastric pressure like pain. Vomiting x 1. No fevers, no diarrhea. Afebrile, tachycardic here.  On exam, LUQ pain and epigastric pain. No rebound or guarding. Also has hx of pancreatitis and biliary issues, will plan on labs and scan. Pain and nausea meds given.  CT normal. Feeling better after pain meds. Stable for discharge.  I personally performed the services described in this documentation, which was scribed in my presence. The recorded information has been  reviewed and is accurate.      Peggy Bucy, MD 03/08/14 2120

## 2014-03-08 NOTE — ED Notes (Signed)
Pt reports centralized chest pressure, nausea, vomiting, LUQ pain, shortness of breath, dizziness that began one hour PTA, EKG done.

## 2014-03-08 NOTE — Discharge Instructions (Signed)
Abdominal Pain, Women °Abdominal (stomach, pelvic, or belly) pain can be caused by many things. It is important to tell your doctor: °· The location of the pain. °· Does it come and go or is it present all the time? °· Are there things that start the pain (eating certain foods, exercise)? °· Are there other symptoms associated with the pain (fever, nausea, vomiting, diarrhea)? °All of this is helpful to know when trying to find the cause of the pain. °CAUSES  °· Stomach: virus or bacteria infection, or ulcer. °· Intestine: appendicitis (inflamed appendix), regional ileitis (Crohn's disease), ulcerative colitis (inflamed colon), irritable bowel syndrome, diverticulitis (inflamed diverticulum of the colon), or cancer of the stomach or intestine. °· Gallbladder disease or stones in the gallbladder. °· Kidney disease, kidney stones, or infection. °· Pancreas infection or cancer. °· Fibromyalgia (pain disorder). °· Diseases of the female organs: °¨ Uterus: fibroid (non-cancerous) tumors or infection. °¨ Fallopian tubes: infection or tubal pregnancy. °¨ Ovary: cysts or tumors. °¨ Pelvic adhesions (scar tissue). °¨ Endometriosis (uterus lining tissue growing in the pelvis and on the pelvic organs). °¨ Pelvic congestion syndrome (female organs filling up with blood just before the menstrual period). °¨ Pain with the menstrual period. °¨ Pain with ovulation (producing an egg). °¨ Pain with an IUD (intrauterine device, birth control) in the uterus. °¨ Cancer of the female organs. °· Functional pain (pain not caused by a disease, may improve without treatment). °· Psychological pain. °· Depression. °DIAGNOSIS  °Your doctor will decide the seriousness of your pain by doing an examination. °· Blood tests. °· X-rays. °· Ultrasound. °· CT scan (computed tomography, special type of X-ray). °· MRI (magnetic resonance imaging). °· Cultures, for infection. °· Barium enema (dye inserted in the large intestine, to better view it with  X-rays). °· Colonoscopy (looking in intestine with a lighted tube). °· Laparoscopy (minor surgery, looking in abdomen with a lighted tube). °· Major abdominal exploratory surgery (looking in abdomen with a large incision). °TREATMENT  °The treatment will depend on the cause of the pain.  °· Many cases can be observed and treated at home. °· Over-the-counter medicines recommended by your caregiver. °· Prescription medicine. °· Antibiotics, for infection. °· Birth control pills, for painful periods or for ovulation pain. °· Hormone treatment, for endometriosis. °· Nerve blocking injections. °· Physical therapy. °· Antidepressants. °· Counseling with a psychologist or psychiatrist. °· Minor or major surgery. °HOME CARE INSTRUCTIONS  °· Do not take laxatives, unless directed by your caregiver. °· Take over-the-counter pain medicine only if ordered by your caregiver. Do not take aspirin because it can cause an upset stomach or bleeding. °· Try a clear liquid diet (broth or water) as ordered by your caregiver. Slowly move to a bland diet, as tolerated, if the pain is related to the stomach or intestine. °· Have a thermometer and take your temperature several times a day, and record it. °· Bed rest and sleep, if it helps the pain. °· Avoid sexual intercourse, if it causes pain. °· Avoid stressful situations. °· Keep your follow-up appointments and tests, as your caregiver orders. °· If the pain does not go away with medicine or surgery, you may try: °¨ Acupuncture. °¨ Relaxation exercises (yoga, meditation). °¨ Group therapy. °¨ Counseling. °SEEK MEDICAL CARE IF:  °· You notice certain foods cause stomach pain. °· Your home care treatment is not helping your pain. °· You need stronger pain medicine. °· You want your IUD removed. °· You feel faint or   lightheaded. °· You develop nausea and vomiting. °· You develop a rash. °· You are having side effects or an allergy to your medicine. °SEEK IMMEDIATE MEDICAL CARE IF:  °· Your  pain does not go away or gets worse. °· You have a fever. °· Your pain is felt only in portions of the abdomen. The right side could possibly be appendicitis. The left lower portion of the abdomen could be colitis or diverticulitis. °· You are passing blood in your stools (bright red or black tarry stools, with or without vomiting). °· You have blood in your urine. °· You develop chills, with or without a fever. °· You pass out. °MAKE SURE YOU:  °· Understand these instructions. °· Will watch your condition. °· Will get help right away if you are not doing well or get worse. °Document Released: 03/20/2007 Document Revised: 10/07/2013 Document Reviewed: 04/09/2009 °Exit[employer]® Patient Information ©2015 [employer]. This information is not intended to replace advice given to you by your health care provider. Make sure you discuss any questions you have with your health care provider. ° °

## 2014-04-02 ENCOUNTER — Emergency Department (HOSPITAL_COMMUNITY)
Admission: EM | Admit: 2014-04-02 | Discharge: 2014-04-02 | Disposition: A | Discharge status: Home / Self Care | Payer: Medicaid Other | Attending: Emergency Medicine | Admitting: Emergency Medicine

## 2014-04-02 ENCOUNTER — Encounter (HOSPITAL_COMMUNITY): Payer: Self-pay | Admitting: Emergency Medicine

## 2014-04-02 ENCOUNTER — Emergency Department (HOSPITAL_COMMUNITY): Payer: Medicaid Other

## 2014-04-02 DIAGNOSIS — K219 Gastro-esophageal reflux disease without esophagitis: Secondary | ICD-10-CM | POA: Insufficient documentation

## 2014-04-02 DIAGNOSIS — K589 Irritable bowel syndrome without diarrhea: Secondary | ICD-10-CM | POA: Insufficient documentation

## 2014-04-02 DIAGNOSIS — Z8639 Personal history of other endocrine, nutritional and metabolic disease: Secondary | ICD-10-CM | POA: Insufficient documentation

## 2014-04-02 DIAGNOSIS — Z880 Allergy status to penicillin: Secondary | ICD-10-CM | POA: Diagnosis not present

## 2014-04-02 DIAGNOSIS — Z7952 Long term (current) use of systemic steroids: Secondary | ICD-10-CM | POA: Insufficient documentation

## 2014-04-02 DIAGNOSIS — Z79899 Other long term (current) drug therapy: Secondary | ICD-10-CM | POA: Insufficient documentation

## 2014-04-02 DIAGNOSIS — K5900 Constipation, unspecified: Secondary | ICD-10-CM | POA: Diagnosis not present

## 2014-04-02 DIAGNOSIS — J45909 Unspecified asthma, uncomplicated: Secondary | ICD-10-CM | POA: Insufficient documentation

## 2014-04-02 DIAGNOSIS — R110 Nausea: Secondary | ICD-10-CM | POA: Diagnosis not present

## 2014-04-02 DIAGNOSIS — R109 Unspecified abdominal pain: Secondary | ICD-10-CM

## 2014-04-02 DIAGNOSIS — R1012 Left upper quadrant pain: Secondary | ICD-10-CM | POA: Insufficient documentation

## 2014-04-02 DIAGNOSIS — R1032 Left lower quadrant pain: Secondary | ICD-10-CM | POA: Diagnosis not present

## 2014-04-02 LAB — CBC WITH DIFFERENTIAL/PLATELET
BASOS ABS: 0 10*3/uL (ref 0.0–0.1)
BASOS PCT: 1 % (ref 0–1)
Eosinophils Absolute: 0.1 10*3/uL (ref 0.0–0.7)
Eosinophils Relative: 3 % (ref 0–5)
HCT: 33.4 % — ABNORMAL LOW (ref 36.0–46.0)
Hemoglobin: 10.7 g/dL — ABNORMAL LOW (ref 12.0–15.0)
Lymphocytes Relative: 33 % (ref 12–46)
Lymphs Abs: 1.5 10*3/uL (ref 0.7–4.0)
MCH: 26.8 pg (ref 26.0–34.0)
MCHC: 32 g/dL (ref 30.0–36.0)
MCV: 83.5 fL (ref 78.0–100.0)
Monocytes Absolute: 0.3 10*3/uL (ref 0.1–1.0)
Monocytes Relative: 7 % (ref 3–12)
NEUTROS ABS: 2.6 10*3/uL (ref 1.7–7.7)
Neutrophils Relative %: 58 % (ref 43–77)
Platelets: 236 10*3/uL (ref 150–400)
RBC: 4 MIL/uL (ref 3.87–5.11)
RDW: 14.9 % (ref 11.5–15.5)
WBC: 4.5 10*3/uL (ref 4.0–10.5)

## 2014-04-02 LAB — COMPREHENSIVE METABOLIC PANEL
ALK PHOS: 65 U/L (ref 39–117)
ALT: 15 U/L (ref 0–35)
AST: 18 U/L (ref 0–37)
Albumin: 3.1 g/dL — ABNORMAL LOW (ref 3.5–5.2)
Anion gap: 10 (ref 5–15)
BUN: 13 mg/dL (ref 6–23)
CO2: 24 mEq/L (ref 19–32)
CREATININE: 0.68 mg/dL (ref 0.50–1.10)
Calcium: 9.2 mg/dL (ref 8.4–10.5)
Chloride: 106 mEq/L (ref 96–112)
GFR calc non Af Amer: >90 mL/min (ref >90)
GLUCOSE: 101 mg/dL — AB (ref 70–99)
POTASSIUM: 4.9 meq/L (ref 3.7–5.3)
Sodium: 140 mEq/L (ref 137–147)
TOTAL PROTEIN: 6.1 g/dL (ref 6.0–8.3)
Total Bilirubin: <0.2 mg/dL — ABNORMAL LOW (ref 0.3–1.2)

## 2014-04-02 MED ORDER — TRAMADOL HCL 50 MG PO TABS: 50.0000 mg | ORAL_TABLET | Freq: Four times a day (QID) | ORAL | Status: DC | PRN

## 2014-04-02 MED ORDER — SODIUM CHLORIDE 0.9 % IV BOLUS (SEPSIS)
1000.0000 mL | Freq: Once | INTRAVENOUS | Status: AC
  Administered 2014-04-02: 1000 mL via INTRAVENOUS

## 2014-04-02 MED ORDER — FLEET ENEMA 7-19 GM/118ML RE ENEM: 1.0000 | ENEMA | Freq: Once | RECTAL | Status: DC

## 2014-04-02 MED ORDER — METOCLOPRAMIDE HCL 5 MG/ML IJ SOLN
10.0000 mg | INTRAMUSCULAR | Status: AC
  Administered 2014-04-02: 10 mg via INTRAVENOUS
  Filled 2014-04-02: qty 2

## 2014-04-02 MED ORDER — FENTANYL CITRATE 0.05 MG/ML IJ SOLN
50.0000 ug | Freq: Once | INTRAMUSCULAR | Status: AC
  Administered 2014-04-02: 50 ug via INTRAVENOUS
  Filled 2014-04-02: qty 2

## 2014-04-02 MED ORDER — POLYETHYLENE GLYCOL 3350 17 GM/SCOOP PO POWD: 17.0000 g | Freq: Every day | ORAL | Status: DC

## 2014-04-02 MED ORDER — KETOROLAC TROMETHAMINE 30 MG/ML IJ SOLN
30.0000 mg | Freq: Once | INTRAMUSCULAR | Status: AC
  Administered 2014-04-02: 30 mg via INTRAVENOUS
  Filled 2014-04-02: qty 1

## 2014-04-02 NOTE — ED Notes (Signed)
Pt having abdominal pain with N,V. Denies diarrhea. Sent here by her doctor./

## 2014-04-02 NOTE — Discharge Instructions (Signed)
Please follow the directions provided.  Be sure to follow-up with your primary care provider regarding this abdominal pain.  Use the enema and the miralax as directed to help produce a bowel movement which should help with your pain.  Don't hesitate to return forn new, worsening or concerning pain.    SEEK IMMEDIATE MEDICAL CARE IF:  You have bright red blood in your stool.  Your constipation lasts for more than 4 days or gets worse.  You have abdominal or rectal pain.  You have thin, pencil-like stools.  You have unexplained weight loss.

## 2014-04-02 NOTE — ED Notes (Signed)
Pt to xray at this time.

## 2014-04-02 NOTE — ED Provider Notes (Signed)
CSN: [id number]     Arrival date & time 04/02/14  1640 History   First MD Initiated Contact with Patient 04/02/14 1824     Chief Complaint  Patient presents with  . Abdominal Pain   (Consider location/radiation/quality/duration/timing/severity/associated sxs/prior Treatment) HPI Peggy Austin is a 43 yo female presenting with abd pain x 2 days.  She saw her PCP 10 days ago and he took her off her bentyl, lyrica, and started her on linzess.  She describes it as a gradual achy pain and rates it as 8/10.  She reports the pain has progressed today and is now a 10/10.  She states she was seen by her PCP today and she was sent to the ED.  She reports nausea when the pain gets worse but denies any vomiting.  She denies any diarrhea or bloody stools.  Her last bm was 4 days ago. She does report passing gas.    Past Medical History  Diagnosis Date  . Hernia   . IBS (irritable bowel syndrome)   . Ulcer   . Thyroid disease   . PIH (pregnancy induced hypertension)   . Asthma   . Gastroparesis   . Pancreatitis   . Partial bowel obstruction   . GERD (gastroesophageal reflux disease)    Past Surgical History  Procedure Laterality Date  . Gastric bypass    . Cholecystectomy    . Tubal ligation    . Endometrial ablation     History reviewed. No pertinent family history. History  Substance Use Topics  . Smoking status: Never Smoker   . Smokeless tobacco: Not on file  . Alcohol Use: 0.5 oz/week    1 drink(s) per week     Comment: occasionally    OB History   Grav Para Term Preterm Abortions TAB SAB Ect Mult Living                 Review of Systems  Constitutional: Negative for fever and chills.  HENT: Negative for sore throat.   Eyes: Negative for visual disturbance.  Respiratory: Negative for cough and shortness of breath.   Cardiovascular: Negative for chest pain and leg swelling.  Gastrointestinal: Positive for nausea, abdominal pain and constipation. Negative for vomiting and  diarrhea.  Genitourinary: Negative for dysuria.  Musculoskeletal: Negative for myalgias.  Skin: Negative for rash.  Neurological: Negative for weakness, numbness and headaches.    Allergies  Amoxicillin; Avelox; Doxycycline; Morphine; Penicillins; Salmon; and Moxifloxacin  Home Medications   Prior to Admission medications   Medication Sig Start Date End Date Taking? Authorizing Provider  albuterol (PROVENTIL HFA;VENTOLIN HFA) 108 (90 BASE) MCG/ACT inhaler Inhale 1-2 puffs into the lungs every 6 (six) hours as needed for wheezing or shortness of breath.   Yes Historical Provider, MD  albuterol (PROVENTIL) (2.5 MG/3ML) 0.083% nebulizer solution Take 2.5 mg by nebulization every 6 (six) hours as needed for wheezing or shortness of breath.   Yes Historical Provider, MD  amphetamine-dextroamphetamine (ADDERALL) 20 MG tablet Take 20 mg by mouth 2 (two) times daily as needed. For nerves per patient   Yes Historical Provider, MD  clonazePAM (KLONOPIN) 1 MG tablet Take 1 mg by mouth 2 (two) times daily as needed for anxiety.   Yes Historical Provider, MD  ferrous sulfate dried (SLOW FE) 160 (50 FE) MG TBCR Take 160 mg by mouth 3 (three) times daily with meals.    Yes Historical Provider, MD  Fiber CAPS Take 3 capsules by mouth daily.  Yes Historical Provider, MD  fluticasone (FLONASE) 50 MCG/ACT nasal spray Place 2 sprays into both nostrils 2 (two) times daily.   Yes Historical Provider, MD  gabapentin (NEURONTIN) 300 MG capsule Take 300 mg by mouth 2 (two) times daily.   Yes Historical Provider, MD  Linaclotide Rol[facility] Lipa) 145 MCG CAPS capsule Take 145 mcg by mouth daily.   Yes Historical Provider, MD  mirtazapine (REMERON) 45 MG tablet Take 45 mg by mouth at bedtime.   Yes Historical Provider, MD  montelukast (SINGULAIR) 10 MG tablet Take 10 mg by mouth at bedtime as needed (for allergies/asthma).   Yes Historical Provider, MD  multivitamin [facility]) per tablet Take 1 tablet by mouth daily.      Yes Historical Provider, MD  mupirocin cream (BACTROBAN) 2 % Apply 1 application topically 2 (two) times daily. 01/26/14  Yes Hollace Kinnier Sofia, PA-C  ondansetron (ZOFRAN ODT) 4 MG disintegrating tablet Take 1 tablet (4 mg total) by mouth every 8 (eight) hours as needed for nausea or vomiting. 02/03/14  Yes Heather Laisure, PA-C  pantoprazole (PROTONIX) 40 MG tablet Take 40 mg by mouth daily.   Yes Historical Provider, MD  pregabalin (LYRICA) 25 MG capsule Take 25 mg by mouth 3 (three) times daily.   Yes Historical Provider, MD   BP 114/79  Pulse 86  Temp(Src) 97.4 F (36.3 C) (Oral)  Resp 18  SpO2 100% Physical Exam  Nursing note and vitals reviewed. Constitutional: She appears well-developed and well-nourished. No distress.  HENT:  Head: Normocephalic and atraumatic.  Mouth/Throat: Oropharynx is clear and moist. No oropharyngeal exudate.  Eyes: Conjunctivae are normal.  Neck: Neck supple. No thyromegaly present.  Cardiovascular: Normal rate, regular rhythm and intact distal pulses.   Pulmonary/Chest: Effort normal and breath sounds normal. No respiratory distress. She has no wheezes. She has no rales. She exhibits no tenderness.  Abdominal: Soft. Bowel sounds are normal. She exhibits no distension and no mass. There is no hepatosplenomegaly. There is tenderness in the left upper quadrant and left lower quadrant. There is no rigidity, no rebound, no guarding, no CVA tenderness, no tenderness at McBurney's point and negative Murphy's sign.    Musculoskeletal: She exhibits no tenderness.  Lymphadenopathy:    She has no cervical adenopathy.  Neurological: She is alert.  Skin: Skin is warm and dry. No rash noted. She is not diaphoretic.  Psychiatric: She has a normal mood and affect.    ED Course  Procedures (including critical care time) Labs Review Labs Reviewed  COMPREHENSIVE METABOLIC PANEL - Abnormal; Notable for the following:    Glucose, Bld 101 (*)    Albumin 3.1 (*)    Total  Bilirubin <0.2 (*)    All other components within normal limits  CBC WITH DIFFERENTIAL - Abnormal; Notable for the following:    Hemoglobin 10.7 (*)    HCT 33.4 (*)    All other components within normal limits    Imaging Review Dg Abd Acute W/chest  04/02/2014   CLINICAL DATA:  Abdominal pain beginning 2 days ago in the left abdomen.  EXAM: ACUTE ABDOMEN SERIES (ABDOMEN 2 VIEW & CHEST 1 VIEW)  COMPARISON:  03/08/2014  FINDINGS: Subsegmental atelectasis or scarring along the left hemidiaphragm. Cardiac and mediastinal margins appear normal.  Postoperative findings in the vicinity of the stomach. Air-fluid levels in the ascending colon and in scattered nondilated loops of small bowel. Appearance is abnormal but nonspecific and there does appear to be formed stool in the distal colon.  No significant abnormal calcifications noted.  IMPRESSION: 1. Abnormal but nonspecific bowel gas patter mid scattered air-fluid levels in the proximal and mid colon and in nondilated loops of small bowel. This could reflect regional ileus or less likely early obstruction.   Electronically Signed   By: Sherryl Barters M.D.   On: 04/02/2014 20:34     EKG Interpretation None      MDM   Final diagnoses:  Abdominal pain  Constipation, unspecified constipation type   43 yo female with recurrent left peri-umbilical abd pain.  She recently received a CT abd/pelvis for similar pain 3 weeks ago without significant findings other than large stool burden.  On pt's exam, her abd was not rigid and without any peritonitic signs.  CBC, CMP, NS bolus, IV toradol and reglan and acute abd xray.  She is requesting food, will keep NPO until xray results.  Initial read on xray reports possible regional ileus. Case discussed with Dr. Johnney Killian.     9:45 Consulted general surgery.  His read of xray is not ileus or any bowel obstruction but continued constipation.    Pt continues to be well-appearing.  IV fentanyl improved her pain.  Her CBC is normal, vital signs are stable, her abd is still soft, with no guarding or signs of peritonitis.  She has eaten and drank in the ED without difficulty.  Discharge instructions include prescription for miralax to help produce soft bowel movement and instructions to follow-up with her PCP regarding this visit.  Return precautions provided.  Pt aware of plan and in agreement.    Filed Vitals:   04/02/14 1830 04/02/14 1900 04/02/14 1930 04/02/14 2151  BP: 114/79 128/83 115/83 127/84  Pulse: 86 87 84 86  Temp:      TempSrc:      Resp:    20  SpO2: 100% 100% 100% 100%   Meds given in ED:  Medications  sodium chloride 0.9 % bolus 1,000 mL (0 mLs Intravenous Stopped 04/02/14 2225)  metoCLOPramide (REGLAN) injection 10 mg (10 mg Intravenous Given 04/02/14 1944)  ketorolac (TORADOL) 30 MG/ML injection 30 mg (30 mg Intravenous Given 04/02/14 1948)  fentaNYL (SUBLIMAZE) injection 50 mcg (50 mcg Intravenous Given 04/02/14 2110)    Discharge Medication List as of 04/02/2014 10:28 PM    START taking these medications   Details  polyethylene glycol powder (GLYCOLAX/MIRALAX) powder Take 17 g by mouth daily. Until daily soft stools  OTC, Starting 04/02/2014, Until Discontinued, Print    sodium phosphate (FLEET) 7-19 GM/118ML ENEM Place 133 mLs (1 enema total) rectally once., Starting 04/02/2014, Print    traMADol (ULTRAM) 50 MG tablet Take 1 tablet (50 mg total) by mouth every 6 (six) hours as needed., Starting 04/02/2014, Until Discontinued, Print           Britt Bottom, NP 04/03/14 1316

## 2014-04-05 NOTE — ED Provider Notes (Signed)
Medical screening examination/treatment/procedure(s) were performed by non-physician practitioner and as supervising physician I was immediately available for consultation/collaboration.   EKG Interpretation None       Charlesetta Shanks, MD 04/05/14 [phone]

## 2014-05-25 ENCOUNTER — Emergency Department (HOSPITAL_BASED_OUTPATIENT_CLINIC_OR_DEPARTMENT_OTHER): Payer: Medicaid Other

## 2014-05-25 ENCOUNTER — Encounter (HOSPITAL_BASED_OUTPATIENT_CLINIC_OR_DEPARTMENT_OTHER): Payer: Self-pay | Admitting: *Deleted

## 2014-05-25 ENCOUNTER — Emergency Department (HOSPITAL_BASED_OUTPATIENT_CLINIC_OR_DEPARTMENT_OTHER)
Admission: EM | Admit: 2014-05-25 | Discharge: 2014-05-25 | Disposition: A | Discharge status: Home / Self Care | Payer: Medicaid Other | Attending: Emergency Medicine | Admitting: Emergency Medicine

## 2014-05-25 DIAGNOSIS — Z9889 Other specified postprocedural states: Secondary | ICD-10-CM | POA: Diagnosis not present

## 2014-05-25 DIAGNOSIS — J45909 Unspecified asthma, uncomplicated: Secondary | ICD-10-CM | POA: Diagnosis not present

## 2014-05-25 DIAGNOSIS — R112 Nausea with vomiting, unspecified: Secondary | ICD-10-CM | POA: Diagnosis not present

## 2014-05-25 DIAGNOSIS — Z79899 Other long term (current) drug therapy: Secondary | ICD-10-CM | POA: Insufficient documentation

## 2014-05-25 DIAGNOSIS — Z3202 Encounter for pregnancy test, result negative: Secondary | ICD-10-CM | POA: Diagnosis not present

## 2014-05-25 DIAGNOSIS — K589 Irritable bowel syndrome without diarrhea: Secondary | ICD-10-CM | POA: Insufficient documentation

## 2014-05-25 DIAGNOSIS — Z8639 Personal history of other endocrine, nutritional and metabolic disease: Secondary | ICD-10-CM | POA: Insufficient documentation

## 2014-05-25 DIAGNOSIS — Z872 Personal history of diseases of the skin and subcutaneous tissue: Secondary | ICD-10-CM | POA: Diagnosis not present

## 2014-05-25 DIAGNOSIS — Z9049 Acquired absence of other specified parts of digestive tract: Secondary | ICD-10-CM | POA: Insufficient documentation

## 2014-05-25 DIAGNOSIS — Z9851 Tubal ligation status: Secondary | ICD-10-CM | POA: Insufficient documentation

## 2014-05-25 DIAGNOSIS — Z7951 Long term (current) use of inhaled steroids: Secondary | ICD-10-CM | POA: Insufficient documentation

## 2014-05-25 DIAGNOSIS — Z9884 Bariatric surgery status: Secondary | ICD-10-CM | POA: Insufficient documentation

## 2014-05-25 DIAGNOSIS — R109 Unspecified abdominal pain: Secondary | ICD-10-CM

## 2014-05-25 DIAGNOSIS — R197 Diarrhea, unspecified: Secondary | ICD-10-CM | POA: Diagnosis not present

## 2014-05-25 DIAGNOSIS — K219 Gastro-esophageal reflux disease without esophagitis: Secondary | ICD-10-CM | POA: Insufficient documentation

## 2014-05-25 DIAGNOSIS — Z880 Allergy status to penicillin: Secondary | ICD-10-CM | POA: Diagnosis not present

## 2014-05-25 LAB — CBC WITH DIFFERENTIAL/PLATELET
Basophils Absolute: 0 10*3/uL (ref 0.0–0.1)
Basophils Relative: 0 % (ref 0–1)
Eosinophils Absolute: 0.1 10*3/uL (ref 0.0–0.7)
Eosinophils Relative: 1 % (ref 0–5)
HCT: 34.4 % — ABNORMAL LOW (ref 36.0–46.0)
Hemoglobin: 11 g/dL — ABNORMAL LOW (ref 12.0–15.0)
LYMPHS ABS: 2.5 10*3/uL (ref 0.7–4.0)
LYMPHS PCT: 40 % (ref 12–46)
MCH: 26 pg (ref 26.0–34.0)
MCHC: 32 g/dL (ref 30.0–36.0)
MCV: 81.3 fL (ref 78.0–100.0)
Monocytes Absolute: 0.4 10*3/uL (ref 0.1–1.0)
Monocytes Relative: 7 % (ref 3–12)
NEUTROS ABS: 3.3 10*3/uL (ref 1.7–7.7)
Neutrophils Relative %: 52 % (ref 43–77)
Platelets: 304 10*3/uL (ref 150–400)
RBC: 4.23 MIL/uL (ref 3.87–5.11)
RDW: 16.5 % — ABNORMAL HIGH (ref 11.5–15.5)
WBC: 6.3 10*3/uL (ref 4.0–10.5)

## 2014-05-25 LAB — COMPREHENSIVE METABOLIC PANEL
ALK PHOS: 94 U/L (ref 39–117)
ALT: 17 U/L (ref 0–35)
AST: 21 U/L (ref 0–37)
Albumin: 3.7 g/dL (ref 3.5–5.2)
Anion gap: 12 (ref 5–15)
BUN: 13 mg/dL (ref 6–23)
CO2: 25 mEq/L (ref 19–32)
Calcium: 9.9 mg/dL (ref 8.4–10.5)
Chloride: 102 mEq/L (ref 96–112)
Creatinine, Ser: 0.6 mg/dL (ref 0.50–1.10)
GFR calc non Af Amer: >90 mL/min (ref >90)
GLUCOSE: 106 mg/dL — AB (ref 70–99)
POTASSIUM: 4.3 meq/L (ref 3.7–5.3)
Sodium: 139 mEq/L (ref 137–147)
Total Bilirubin: <0.2 mg/dL — ABNORMAL LOW (ref 0.3–1.2)
Total Protein: 7 g/dL (ref 6.0–8.3)

## 2014-05-25 LAB — URINALYSIS, ROUTINE W REFLEX MICROSCOPIC
Bilirubin Urine: NEGATIVE
Glucose, UA: NEGATIVE mg/dL
Hgb urine dipstick: NEGATIVE
Ketones, ur: NEGATIVE mg/dL
Leukocytes, UA: NEGATIVE
NITRITE: NEGATIVE
Protein, ur: NEGATIVE mg/dL
SPECIFIC GRAVITY, URINE: 1.027 (ref 1.005–1.030)
UROBILINOGEN UA: 0.2 mg/dL (ref 0.0–1.0)
pH: 5.5 (ref 5.0–8.0)

## 2014-05-25 LAB — PREGNANCY, URINE: Preg Test, Ur: NEGATIVE

## 2014-05-25 LAB — LIPASE, BLOOD: Lipase: 48 U/L (ref 11–59)

## 2014-05-25 MED ORDER — SODIUM CHLORIDE 0.9 % IV BOLUS (SEPSIS)
1000.0000 mL | Freq: Once | INTRAVENOUS | Status: AC
  Administered 2014-05-25: 1000 mL via INTRAVENOUS

## 2014-05-25 MED ORDER — HYDROMORPHONE HCL 1 MG/ML IJ SOLN
1.0000 mg | Freq: Once | INTRAMUSCULAR | Status: AC
  Administered 2014-05-25: 1 mg via INTRAVENOUS
  Filled 2014-05-25: qty 1

## 2014-05-25 MED ORDER — METOCLOPRAMIDE HCL 5 MG/ML IJ SOLN
10.0000 mg | Freq: Once | INTRAMUSCULAR | Status: AC
  Administered 2014-05-25: 10 mg via INTRAVENOUS
  Filled 2014-05-25: qty 2

## 2014-05-25 NOTE — Discharge Instructions (Signed)
Abdominal Pain, Women °Abdominal (stomach, pelvic, or belly) pain can be caused by many things. It is important to tell your doctor: °· The location of the pain. °· Does it come and go or is it present all the time? °· Are there things that start the pain (eating certain foods, exercise)? °· Are there other symptoms associated with the pain (fever, nausea, vomiting, diarrhea)? °All of this is helpful to know when trying to find the cause of the pain. °CAUSES  °· Stomach: virus or bacteria infection, or ulcer. °· Intestine: appendicitis (inflamed appendix), regional ileitis (Crohn's disease), ulcerative colitis (inflamed colon), irritable bowel syndrome, diverticulitis (inflamed diverticulum of the colon), or cancer of the stomach or intestine. °· Gallbladder disease or stones in the gallbladder. °· Kidney disease, kidney stones, or infection. °· Pancreas infection or cancer. °· Fibromyalgia (pain disorder). °· Diseases of the female organs: °¨ Uterus: fibroid (non-cancerous) tumors or infection. °¨ Fallopian tubes: infection or tubal pregnancy. °¨ Ovary: cysts or tumors. °¨ Pelvic adhesions (scar tissue). °¨ Endometriosis (uterus lining tissue growing in the pelvis and on the pelvic organs). °¨ Pelvic congestion syndrome (female organs filling up with blood just before the menstrual period). °¨ Pain with the menstrual period. °¨ Pain with ovulation (producing an egg). °¨ Pain with an IUD (intrauterine device, birth control) in the uterus. °¨ Cancer of the female organs. °· Functional pain (pain not caused by a disease, may improve without treatment). °· Psychological pain. °· Depression. °DIAGNOSIS  °Your doctor will decide the seriousness of your pain by doing an examination. °· Blood tests. °· X-rays. °· Ultrasound. °· CT scan (computed tomography, special type of X-ray). °· MRI (magnetic resonance imaging). °· Cultures, for infection. °· Barium enema (dye inserted in the large intestine, to better view it with  X-rays). °· Colonoscopy (looking in intestine with a lighted tube). °· Laparoscopy (minor surgery, looking in abdomen with a lighted tube). °· Major abdominal exploratory surgery (looking in abdomen with a large incision). °TREATMENT  °The treatment will depend on the cause of the pain.  °· Many cases can be observed and treated at home. °· Over-the-counter medicines recommended by your caregiver. °· Prescription medicine. °· Antibiotics, for infection. °· Birth control pills, for painful periods or for ovulation pain. °· Hormone treatment, for endometriosis. °· Nerve blocking injections. °· Physical therapy. °· Antidepressants. °· Counseling with a psychologist or psychiatrist. °· Minor or major surgery. °HOME CARE INSTRUCTIONS  °· Do not take laxatives, unless directed by your caregiver. °· Take over-the-counter pain medicine only if ordered by your caregiver. Do not take aspirin because it can cause an upset stomach or bleeding. °· Try a clear liquid diet (broth or water) as ordered by your caregiver. Slowly move to a bland diet, as tolerated, if the pain is related to the stomach or intestine. °· Have a thermometer and take your temperature several times a day, and record it. °· Bed rest and sleep, if it helps the pain. °· Avoid sexual intercourse, if it causes pain. °· Avoid stressful situations. °· Keep your follow-up appointments and tests, as your caregiver orders. °· If the pain does not go away with medicine or surgery, you may try: °¨ Acupuncture. °¨ Relaxation exercises (yoga, meditation). °¨ Group therapy. °¨ Counseling. °SEEK MEDICAL CARE IF:  °· You notice certain foods cause stomach pain. °· Your home care treatment is not helping your pain. °· You need stronger pain medicine. °· You want your IUD removed. °· You feel faint or   lightheaded. °· You develop nausea and vomiting. °· You develop a rash. °· You are having side effects or an allergy to your medicine. °SEEK IMMEDIATE MEDICAL CARE IF:  °· Your  pain does not go away or gets worse. °· You have a fever. °· Your pain is felt only in portions of the abdomen. The right side could possibly be appendicitis. The left lower portion of the abdomen could be colitis or diverticulitis. °· You are passing blood in your stools (bright red or black tarry stools, with or without vomiting). °· You have blood in your urine. °· You develop chills, with or without a fever. °· You pass out. °MAKE SURE YOU:  °· Understand these instructions. °· Will watch your condition. °· Will get help right away if you are not doing well or get worse. °Document Released: 03/20/2007 Document Revised: 10/07/2013 Document Reviewed: 04/09/2009 °Exit[employer]® Patient Information ©2015 [employer]. This information is not intended to replace advice given to you by your health care provider. Make sure you discuss any questions you have with your health care provider. ° °

## 2014-05-25 NOTE — ED Notes (Signed)
Pt c/o diffuse abd pain x 2 hrs HX IBS

## 2014-05-25 NOTE — ED Provider Notes (Signed)
CSN: [id number]     Arrival date & time 05/25/14  1848 History   First MD Initiated Contact with Patient 05/25/14 1949     Chief Complaint  Patient presents with  . Abdominal Pain     (Consider location/radiation/quality/duration/timing/severity/associated sxs/prior Treatment) HPI   43 year old female with history of IBS, ulcers, gastroparesis, pancreatitis, and GERD who presents complaining of diffuse abdominal pain. Patient states approximately 2 hours ago she was sitting down wrapping gifts when she developed acute onset of sharp stabbing pain to her left side abdomen. Pain is intense, she felt nauseous, vomited several times and also had several bouts of nonbloody non-mucousy loose stools. States she was constipated earlier this morning and took her medication and now she is having diarrhea. Patient mentioned that she has had this same pain in the exact same location for quite a while and correlated to a nerve injury from a prior abdominal surgery. Today the pain is more intense. No clicks of fever, chills, chest pain, shortness of breath, productive cough, back pain, dysuria, or rash.. Patient states in the past she did follow up with pain management for her abdominal pain but because she does not like to take pain medication she stopped going to pain management. She has not been taking pain medication for the past 2 months.  Past Medical History  Diagnosis Date  . Hernia   . IBS (irritable bowel syndrome)   . Ulcer   . Thyroid disease   . PIH (pregnancy induced hypertension)   . Asthma   . Gastroparesis   . Pancreatitis   . Partial bowel obstruction   . GERD (gastroesophageal reflux disease)    Past Surgical History  Procedure Laterality Date  . Gastric bypass    . Cholecystectomy    . Tubal ligation    . Endometrial ablation     History reviewed. No pertinent family history. History  Substance Use Topics  . Smoking status: Never Smoker   . Smokeless tobacco: Not on file   . Alcohol Use: 0.5 oz/week    1 drink(s) per week     Comment: occasionally    OB History    No data available     Review of Systems  All other systems reviewed and are negative.     Allergies  Amoxicillin; Avelox; Doxycycline; Morphine; Penicillins; Salmon; and Moxifloxacin  Home Medications   Prior to Admission medications   Medication Sig Start Date End Date Taking? Authorizing Provider  albuterol (PROVENTIL HFA;VENTOLIN HFA) 108 (90 BASE) MCG/ACT inhaler Inhale 1-2 puffs into the lungs every 6 (six) hours as needed for wheezing or shortness of breath.    Historical Provider, MD  albuterol (PROVENTIL) (2.5 MG/3ML) 0.083% nebulizer solution Take 2.5 mg by nebulization every 6 (six) hours as needed for wheezing or shortness of breath.    Historical Provider, MD  amphetamine-dextroamphetamine (ADDERALL) 20 MG tablet Take 20 mg by mouth 2 (two) times daily as needed. For nerves per patient    Historical Provider, MD  clonazePAM (KLONOPIN) 1 MG tablet Take 1 mg by mouth 2 (two) times daily as needed for anxiety.    Historical Provider, MD  ferrous sulfate dried (SLOW FE) 160 (50 FE) MG TBCR Take 160 mg by mouth 3 (three) times daily with meals.     Historical Provider, MD  Fiber CAPS Take 3 capsules by mouth daily.    Historical Provider, MD  fluticasone (FLONASE) 50 MCG/ACT nasal spray Place 2 sprays into both nostrils 2 (two) times  daily.    Historical Provider, MD  gabapentin (NEURONTIN) 300 MG capsule Take 300 mg by mouth 2 (two) times daily.    Historical Provider, MD  Linaclotide Rolan Lipa) 145 MCG CAPS capsule Take 145 mcg by mouth daily.    Historical Provider, MD  mirtazapine (REMERON) 45 MG tablet Take 90 mg by mouth at bedtime.     Historical Provider, MD  multivitamin [facility]) per tablet Take 1 tablet by mouth daily.      Historical Provider, MD  mupirocin cream (BACTROBAN) 2 % Apply 1 application topically 2 (two) times daily. 01/26/14   Fransico Meadow, PA-C   ondansetron (ZOFRAN ODT) 4 MG disintegrating tablet Take 1 tablet (4 mg total) by mouth every 8 (eight) hours as needed for nausea or vomiting. 02/03/14   Hyman Bible, PA-C  pantoprazole (PROTONIX) 40 MG tablet Take 40 mg by mouth daily.    Historical Provider, MD  polyethylene glycol powder (GLYCOLAX/MIRALAX) powder Take 17 g by mouth daily. Until daily soft stools  OTC 04/02/14   Britt Bottom, NP  sodium phosphate (FLEET) 7-19 GM/118ML ENEM Place 133 mLs (1 enema total) rectally once. 04/02/14   Britt Bottom, NP   BP 139/92 mmHg  Pulse 105  Temp(Src) 98.2 F (36.8 C) (Oral)  Resp 18  Ht 5\' 2"  (1.575 m)  Wt 160 lb (72.576 kg)  BMI 29.26 kg/m2  SpO2 100% Physical Exam  Constitutional: She appears well-developed and well-nourished. No distress.  HENT:  Head: Atraumatic.  Eyes: Conjunctivae are normal.  Neck: Neck supple.  Cardiovascular: Normal rate and regular rhythm.   Pulmonary/Chest: Effort normal and breath sounds normal. No respiratory distress. She has no wheezes. She exhibits no tenderness.  Abdominal: Soft. There is tenderness (Point tenderness to left lateral mid abdomen on palpation without guarding or rebound tenderness.).  Neurological: She is alert.  Skin: No rash noted.  Psychiatric: She has a normal mood and affect.  Nursing note and vitals reviewed.   ED Course  Procedures (including critical care time)  11:13 PM Patient presents with recurrent abdominal pain primarily to her left mid anterior abdomen. Patient states that this pain is likely due to a nerve irritation from prior surgery which she has had intermittent pain in the past. She also endorsed nausea vomiting and diarrhea as well. An acute abdominal series performed showing no evidence of small bowel obstruction. Her labs are reassuring. Her pain is well managed at this time and she is able to tolerates by mouth. I strongly recommend patient to follow-up closely with her primary care  provider for further management of her condition. Patient voiced understanding and agrees with plan. Patient is stable for discharge.  Labs Review Labs Reviewed  URINALYSIS, ROUTINE W REFLEX MICROSCOPIC - Abnormal; Notable for the following:    APPearance CLOUDY (*)    All other components within normal limits  CBC WITH DIFFERENTIAL - Abnormal; Notable for the following:    Hemoglobin 11.0 (*)    HCT 34.4 (*)    RDW 16.5 (*)    All other components within normal limits  COMPREHENSIVE METABOLIC PANEL - Abnormal; Notable for the following:    Glucose, Bld 106 (*)    Total Bilirubin <0.2 (*)    All other components within normal limits  LIPASE, BLOOD  PREGNANCY, URINE    Imaging Review Dg Abd Acute W/chest  05/25/2014   CLINICAL DATA:  History of air level bowel syndrome. Cholecystectomy. Diffuse abdominal pain 2 hr  EXAM: ACUTE ABDOMEN SERIES (  ABDOMEN 2 VIEW & CHEST 1 VIEW)  COMPARISON:  Radiograph 05/24/2014  FINDINGS: Lungs are clear.  No free air beneath hemidiaphragm.  Surgical clips in the left abdomen. No dilated loops of large or small bowel. There is gas the rectum.  IMPRESSION: Lungs are clear.  No bowel obstruction.  Multiple surgical clips in the left abdomen.   Electronically Signed   By: Suzy Bouchard M.D.   On: 05/25/2014 21:24     EKG Interpretation None      MDM   Final diagnoses:  Abdominal pain  Nausea vomiting and diarrhea    BP 136/91 mmHg  Pulse 99  Temp(Src) 98.2 F (36.8 C) (Oral)  Resp 14  Ht 5\' 2"  (1.575 m)  Wt 160 lb (72.576 kg)  BMI 29.26 kg/m2  SpO2 100%  I have reviewed nursing notes and vital signs. I personally reviewed the imaging tests through PACS system  I reviewed available ER/hospitalization records thought the EMR    Domenic Moras, [place] 05/25/14 2315

## 2014-05-25 NOTE — ED Notes (Signed)
Pt drank 2 juices and ate several saltine crackers with no nausea or vomiting. Connye Burkitt, RN

## 2014-07-26 ENCOUNTER — Emergency Department (INDEPENDENT_AMBULATORY_CARE_PROVIDER_SITE_OTHER): Payer: Medicaid Other

## 2014-07-26 ENCOUNTER — Emergency Department (HOSPITAL_COMMUNITY)
Admission: EM | Admit: 2014-07-26 | Discharge: 2014-07-26 | Disposition: A | Discharge status: Home / Self Care | Payer: Medicaid Other | Source: Home / Self Care

## 2014-07-26 ENCOUNTER — Encounter (HOSPITAL_COMMUNITY): Payer: Self-pay | Admitting: *Deleted

## 2014-07-26 DIAGNOSIS — R0781 Pleurodynia: Secondary | ICD-10-CM

## 2014-07-26 DIAGNOSIS — M940 Chondrocostal junction syndrome [Tietze]: Secondary | ICD-10-CM

## 2014-07-26 MED ORDER — METHYLPREDNISOLONE ACETATE 80 MG/ML IJ SUSP
80.0000 mg | Freq: Once | INTRAMUSCULAR | Status: AC
  Administered 2014-07-26: 80 mg via INTRAMUSCULAR

## 2014-07-26 MED ORDER — METHYLPREDNISOLONE ACETATE 80 MG/ML IJ SUSP
INTRAMUSCULAR | Status: AC
  Filled 2014-07-26: qty 1

## 2014-07-26 NOTE — ED Provider Notes (Signed)
CSN: [id number]     Arrival date & time 07/26/14  1355 History   None    Chief Complaint  Patient presents with  . Shortness of Breath   (Consider location/radiation/quality/duration/timing/severity/associated sxs/prior Treatment) HPI Comments: Peggy Austin is a 44 yo female with a past medical history of Asthma and recent diagnosis of Bronchitis who presents with chest tenderness and feelings of "breathlessness". She completed both a Z-pack and Pred Pack 4 days ago. She seemed improved however last pm developed a "stabbing" pain in the left lower anterior ribs with soreness to touch or ROM. No cough or congestion. No fevers or chills. No injury.   Patient is a 44 y.o. female presenting with shortness of breath. The history is provided by the patient.  Shortness of Breath Associated symptoms: chest pain and sore throat   Associated symptoms: no cough, no fever and no wheezing     Past Medical History  Diagnosis Date  . Hernia   . IBS (irritable bowel syndrome)   . Ulcer   . Thyroid disease   . PIH (pregnancy induced hypertension)   . Asthma   . Gastroparesis   . Pancreatitis   . Partial bowel obstruction   . GERD (gastroesophageal reflux disease)    Past Surgical History  Procedure Laterality Date  . Gastric bypass    . Cholecystectomy    . Tubal ligation    . Endometrial ablation     History reviewed. No pertinent family history. History  Substance Use Topics  . Smoking status: Never Smoker   . Smokeless tobacco: Not on file  . Alcohol Use: 0.5 oz/week    1 drink(s) per week     Comment: occasionally    OB History    No data available     Review of Systems  Constitutional: Negative for fever, chills and activity change.  HENT: Positive for sore throat.   Respiratory: Positive for shortness of breath. Negative for cough and wheezing.   Cardiovascular: Positive for chest pain.  Gastrointestinal: Negative.   Musculoskeletal: Negative.   Skin: Negative.    Neurological: Negative.   Psychiatric/Behavioral: Negative.     Allergies  Amoxicillin; Avelox; Doxycycline; Morphine; Penicillins; Salmon; and Moxifloxacin  Home Medications   Prior to Admission medications   Medication Sig Start Date End Date Taking? Authorizing Provider  albuterol (PROVENTIL HFA;VENTOLIN HFA) 108 (90 BASE) MCG/ACT inhaler Inhale 1-2 puffs into the lungs every 6 (six) hours as needed for wheezing or shortness of breath.    Historical Provider, MD  albuterol (PROVENTIL) (2.5 MG/3ML) 0.083% nebulizer solution Take 2.5 mg by nebulization every 6 (six) hours as needed for wheezing or shortness of breath.    Historical Provider, MD  amphetamine-dextroamphetamine (ADDERALL) 20 MG tablet Take 20 mg by mouth 2 (two) times daily as needed. For nerves per patient    Historical Provider, MD  clonazePAM (KLONOPIN) 1 MG tablet Take 1 mg by mouth 2 (two) times daily as needed for anxiety.    Historical Provider, MD  ferrous sulfate dried (SLOW FE) 160 (50 FE) MG TBCR Take 160 mg by mouth 3 (three) times daily with meals.     Historical Provider, MD  Fiber CAPS Take 3 capsules by mouth daily.    Historical Provider, MD  fluticasone (FLONASE) 50 MCG/ACT nasal spray Place 2 sprays into both nostrils 2 (two) times daily.    Historical Provider, MD  gabapentin (NEURONTIN) 300 MG capsule Take 300 mg by mouth 2 (two) times daily.  Historical Provider, MD  Linaclotide Rolan Lipa) 145 MCG CAPS capsule Take 145 mcg by mouth daily.    Historical Provider, MD  mirtazapine (REMERON) 45 MG tablet Take 90 mg by mouth at bedtime.     Historical Provider, MD  multivitamin [facility]) per tablet Take 1 tablet by mouth daily.      Historical Provider, MD  mupirocin cream (BACTROBAN) 2 % Apply 1 application topically 2 (two) times daily. 01/26/14   Fransico Meadow, PA-C  ondansetron (ZOFRAN ODT) 4 MG disintegrating tablet Take 1 tablet (4 mg total) by mouth every 8 (eight) hours as needed for nausea or  vomiting. 02/03/14   Hyman Bible, PA-C  pantoprazole (PROTONIX) 40 MG tablet Take 40 mg by mouth daily.    Historical Provider, MD  polyethylene glycol powder (GLYCOLAX/MIRALAX) powder Take 17 g by mouth daily. Until daily soft stools  OTC 04/02/14   Britt Bottom, NP  sodium phosphate (FLEET) 7-19 GM/118ML ENEM Place 133 mLs (1 enema total) rectally once. 04/02/14   Britt Bottom, NP   BP 122/77 mmHg  Pulse 101  Temp(Src) 98.4 F (36.9 C) (Oral)  Resp 18  SpO2 100%  LMP 07/12/2014 Physical Exam  Constitutional: She is oriented to person, place, and time. She appears well-developed and well-nourished.  Appears anxious though NAD, sitting comfortably on exam table, speaking in complete sentences  HENT:  Head: Normocephalic and atraumatic.  Mouth/Throat: No oropharyngeal exudate.  Mild oropharynx injection. No exudate  Eyes: Pupils are equal, round, and reactive to light.  Neck: Normal range of motion.  Cardiovascular: Normal rate, regular rhythm and normal heart sounds.   Pulmonary/Chest: Effort normal and breath sounds normal. She has no wheezes. She exhibits tenderness.  Sats 100%. Tenderness to light palpation along the left lateral ribs along 9-12 areas. Pain with ROM. No crepitus  Musculoskeletal: Normal range of motion. She exhibits no edema.  Lymphadenopathy:    She has no cervical adenopathy.  Neurological: She is alert and oriented to person, place, and time.  Skin: Skin is warm and dry. No rash noted. No erythema.  Psychiatric: Her behavior is normal.  Nursing note and vitals reviewed.   ED Course  Procedures (including critical care time) Labs Review Labs Reviewed - No data to display  Imaging Review Dg Chest 2 View  07/26/2014   CLINICAL DATA:  Initial evaluation for cough and shortness of breath for 10 days, personal history of asthma  EXAM: CHEST  2 VIEW  COMPARISON:  05/25/2014  FINDINGS: The heart size and mediastinal contours are within normal  limits. Both lungs are clear. The visualized skeletal structures are unremarkable.  IMPRESSION: No active cardiopulmonary disease.   Electronically Signed   By: Skipper Cliche M.D.   On: 07/26/2014 15:49     MDM   1. Costochondritis   2. Rib pain on left side    1. Vitals, exam, CXR all non-revealing for respiratory findings. Exam tenderness suggest Costochondritis. Will treat with Steroid injection as patient cannot tolerate NSAID's in the setting of Peptic Ulcers. F/U if worsens.     Bjorn Pippin, PA-C 07/26/14 1610

## 2014-07-26 NOTE — ED Notes (Signed)
Pt  Reports  Symptoms    Of  Breath     Tenderness  Of  l  Side     With  Pain on  Inspiration       And  On palpation              Pt  Reports  Like  She  Is  Unable  To  Take  A  Deep  Breath   Completely           She  Reports  A  History  Of  Asthma     And uses      Nebulizers/ inhalers  -  Pt  States      Was  Seen  10  Days  Ago  For    Bronchitis

## 2014-07-26 NOTE — Discharge Instructions (Signed)
Chest Wall Pain Chest wall pain is pain felt in or around the chest bones and muscles. It may take up to 6 weeks to get better. It may take longer if you are active. Chest wall pain can happen on its own. Other times, things like germs, injury, coughing, or exercise can cause the pain. HOME CARE   Avoid activities that make you tired or cause pain. Try not to use your chest, belly (abdominal), or side muscles. Do not use heavy weights.  Put ice on the sore area.  Put ice in a plastic bag.  Place a towel between your skin and the bag.  Leave the ice on for 15-20 minutes for the first 2 days.  Only take medicine as told by your doctor. GET HELP RIGHT AWAY IF:   You have more pain or are very uncomfortable.  You have a fever.  Your chest pain gets worse.  You have new problems.  You feel sick to your stomach (nauseous) or throw up (vomit).  You start to sweat or feel lightheaded.  You have a cough with mucus (phlegm).  You cough up blood. MAKE SURE YOU:   Understand these instructions.  Will watch your condition.  Will get help right away if you are not doing well or get worse. Document Released: 11/09/2007 Document Revised: 08/15/2011 Document Reviewed: 01/17/2011 [facility] Patient Information 2015 [place], [place]. This information is not intended to replace advice given to you by your health care provider. Make sure you discuss any questions you have with your health care provider.  Costochondritis Costochondritis is a condition in which the tissue (cartilage) that connects your ribs with your breastbone (sternum) becomes irritated. It causes pain in the chest and rib area. It usually goes away on its own over time. HOME CARE  Avoid activities that wear you out.  Do not strain your ribs. Avoid activities that use your:  Chest.  Belly.  Side muscles.  Put ice on the area for the first 2 days after the pain starts.  Put ice in a plastic bag.  Place a towel  between your skin and the bag.  Leave the ice on for 20 minutes, 2-3 times a day.  Only take medicine as told by your doctor. GET HELP IF:  You have redness or puffiness (swelling) in the rib area.  Your pain does not go away with rest or medicine. GET HELP RIGHT AWAY IF:   Your pain gets worse.  You are very uncomfortable.  You have trouble breathing.  You cough up blood.  You start sweating or throwing up (vomiting).  You have a fever or lasting symptoms for more than 2-3 days.  You have a fever and your symptoms suddenly get worse. MAKE SURE YOU:   Understand these instructions.  Will watch your condition.  Will get help right away if you are not doing well or get worse. Document Released: 11/09/2007 Document Revised: 01/23/2013 Document Reviewed: 12/25/2012 [facility] Patient Information 2015 [place], [place]. This information is not intended to replace advice given to you by your health care provider. Make sure you discuss any questions you have with your health care provider.    May use Tylenol when needed as well. Steroid injection should start helping tomorrow. Certainly if worsens f/u. Use your nebulizer's as needed.

## 2014-08-28 ENCOUNTER — Emergency Department (HOSPITAL_COMMUNITY): Payer: Medicaid Other

## 2014-08-28 ENCOUNTER — Encounter (HOSPITAL_BASED_OUTPATIENT_CLINIC_OR_DEPARTMENT_OTHER): Payer: Self-pay | Admitting: *Deleted

## 2014-08-28 ENCOUNTER — Emergency Department (HOSPITAL_BASED_OUTPATIENT_CLINIC_OR_DEPARTMENT_OTHER)
Admission: EM | Admit: 2014-08-28 | Discharge: 2014-08-29 | Disposition: A | Discharge status: Other Acute Inpatient Hospital | Payer: Medicaid Other | Attending: Emergency Medicine | Admitting: Emergency Medicine

## 2014-08-28 DIAGNOSIS — S39012A Strain of muscle, fascia and tendon of lower back, initial encounter: Secondary | ICD-10-CM

## 2014-08-28 DIAGNOSIS — M545 Low back pain, unspecified: Secondary | ICD-10-CM

## 2014-08-28 DIAGNOSIS — R29898 Other symptoms and signs involving the musculoskeletal system: Secondary | ICD-10-CM

## 2014-08-28 DIAGNOSIS — M549 Dorsalgia, unspecified: Secondary | ICD-10-CM | POA: Diagnosis present

## 2014-08-28 DIAGNOSIS — G8929 Other chronic pain: Secondary | ICD-10-CM | POA: Insufficient documentation

## 2014-08-28 LAB — BASIC METABOLIC PANEL
ANION GAP: 9 (ref 5–15)
BUN: 15 mg/dL (ref 6–23)
CO2: 22 mmol/L (ref 19–32)
CREATININE: 0.78 mg/dL (ref 0.50–1.10)
Calcium: 9.4 mg/dL (ref 8.4–10.5)
Chloride: 106 mmol/L (ref 96–112)
GFR calc non Af Amer: >90 mL/min (ref >90)
Glucose, Bld: 92 mg/dL (ref 70–99)
Potassium: 3.7 mmol/L (ref 3.5–5.1)
Sodium: 137 mmol/L (ref 135–145)

## 2014-08-28 LAB — CBC WITH DIFFERENTIAL/PLATELET
Basophils Absolute: 0 10*3/uL (ref 0.0–0.1)
Basophils Relative: 1 % (ref 0–1)
Eosinophils Absolute: 0.1 10*3/uL (ref 0.0–0.7)
Eosinophils Relative: 1 % (ref 0–5)
HCT: 34.8 % — ABNORMAL LOW (ref 36.0–46.0)
HEMOGLOBIN: 11 g/dL — AB (ref 12.0–15.0)
LYMPHS ABS: 2.1 10*3/uL (ref 0.7–4.0)
LYMPHS PCT: 37 % (ref 12–46)
MCH: 25.8 pg — ABNORMAL LOW (ref 26.0–34.0)
MCHC: 31.6 g/dL (ref 30.0–36.0)
MCV: 81.5 fL (ref 78.0–100.0)
MONOS PCT: 9 % (ref 3–12)
Monocytes Absolute: 0.5 10*3/uL (ref 0.1–1.0)
NEUTROS ABS: 3 10*3/uL (ref 1.7–7.7)
Neutrophils Relative %: 52 % (ref 43–77)
Platelets: 373 10*3/uL (ref 150–400)
RBC: 4.27 MIL/uL (ref 3.87–5.11)
RDW: 18.3 % — ABNORMAL HIGH (ref 11.5–15.5)
WBC: 5.8 10*3/uL (ref 4.0–10.5)

## 2014-08-28 LAB — PREGNANCY, URINE: Preg Test, Ur: NEGATIVE

## 2014-08-28 MED ORDER — HYDROMORPHONE HCL 1 MG/ML IJ SOLN
0.5000 mg | Freq: Once | INTRAMUSCULAR | Status: AC
  Administered 2014-08-28: 0.5 mg via INTRAVENOUS
  Filled 2014-08-28: qty 1

## 2014-08-28 MED ORDER — ACETAMINOPHEN 325 MG PO TABS: 325.0000 mg | ORAL_TABLET | Freq: Four times a day (QID) | ORAL | Status: DC | PRN

## 2014-08-28 MED ORDER — ONDANSETRON HCL 4 MG/2ML IJ SOLN
4.0000 mg | Freq: Once | INTRAMUSCULAR | Status: AC
  Administered 2014-08-28: 4 mg via INTRAVENOUS
  Filled 2014-08-28: qty 2

## 2014-08-28 MED ORDER — HYDROCODONE-ACETAMINOPHEN 5-325 MG PO TABS: 1.0000 | ORAL_TABLET | Freq: Four times a day (QID) | ORAL | Status: DC | PRN

## 2014-08-28 MED ORDER — HYDROMORPHONE HCL 1 MG/ML IJ SOLN
1.0000 mg | Freq: Once | INTRAMUSCULAR | Status: AC
  Administered 2014-08-28: 1 mg via INTRAVENOUS
  Filled 2014-08-28: qty 1

## 2014-08-28 NOTE — ED Notes (Addendum)
Pt standing in ED room and appears anxious and states she feels like she cannot breathe well. States she used nebulizer at home today.  She tells me she has chronic back pain, however back pain has become worse over the past few days.  Pain is described as severe and she is unable to get comfortable.  90 degree forward flexion, however reports pain worsens.  Bowel incontinence x 2 which is unusual for her.  Strength 5/5, no focal neuro deficits. Denies injury. Also reports a "spot" on left lower leg that has been treated with Bactrim.  No erythema, streaking, warmth or swelling.  Area appears to be healing. States she was evaluated by her PMD today and had an elevated "white count".

## 2014-08-28 NOTE — ED Notes (Signed)
Asking how long the wait is.

## 2014-08-28 NOTE — ED Notes (Signed)
Patient asked to change into a gown.  

## 2014-08-28 NOTE — ED Notes (Signed)
Pt to mri via stretcher with transporter

## 2014-08-28 NOTE — ED Notes (Signed)
Report called to [place], [place] ED Charge nurse

## 2014-08-28 NOTE — ED Provider Notes (Signed)
CSN: [id number]     Arrival date & time 08/28/14  1432 History   First MD Initiated Contact with Patient 08/28/14 1606     No chief complaint on file.    (Consider location/radiation/quality/duration/timing/severity/associated sxs/prior Treatment) HPI  This is a 44 year old female presents emergency Department with chief complaint of acute back pain and leg weakness. (Before meals was moving a chair 2 days ago when she fell. She had immediate severe pain. Since that time she has had new onset left leg weakness, but she states she is unable to wiggle her toes. She has had the leg give out from underneath her several times. She also complains of saddle paresthesia. She has had 2 episodes of bowel incontinence. She states she is not sure if this is due to IBS, but she states she was unable to feel the stool coming out. She has a past medical history of previous spinal surgeries performed by Dr. Tonita Cong.    Past Medical History  Diagnosis Date  . Hernia   . IBS (irritable bowel syndrome)   . Ulcer   . Thyroid disease   . PIH (pregnancy induced hypertension)   . Asthma   . Gastroparesis   . Pancreatitis   . Partial bowel obstruction   . GERD (gastroesophageal reflux disease)    Past Surgical History  Procedure Laterality Date  . Gastric bypass    . Cholecystectomy    . Tubal ligation    . Endometrial ablation     No family history on file. History  Substance Use Topics  . Smoking status: Never Smoker   . Smokeless tobacco: Not on file  . Alcohol Use: 0.5 oz/week    1 drink(s) per week     Comment: occasionally    OB History    No data available     Review of Systems  Ten systems reviewed and are negative for acute change, except as noted in the HPI.    Allergies  Amoxicillin; Avelox; Doxycycline; Morphine; Penicillins; Salmon; and Moxifloxacin  Home Medications   Prior to Admission medications   Medication Sig Start Date End Date Taking? Authorizing Provider   Levothyroxine Sodium (SYNTHROID PO) Take by mouth.   Yes Historical Provider, MD  albuterol (PROVENTIL HFA;VENTOLIN HFA) 108 (90 BASE) MCG/ACT inhaler Inhale 1-2 puffs into the lungs every 6 (six) hours as needed for wheezing or shortness of breath.    Historical Provider, MD  albuterol (PROVENTIL) (2.5 MG/3ML) 0.083% nebulizer solution Take 2.5 mg by nebulization every 6 (six) hours as needed for wheezing or shortness of breath.    Historical Provider, MD  amphetamine-dextroamphetamine (ADDERALL) 20 MG tablet Take 20 mg by mouth 2 (two) times daily as needed. For nerves per patient    Historical Provider, MD  clonazePAM (KLONOPIN) 1 MG tablet Take 1 mg by mouth 2 (two) times daily as needed for anxiety.    Historical Provider, MD  ferrous sulfate dried (SLOW FE) 160 (50 FE) MG TBCR Take 160 mg by mouth 3 (three) times daily with meals.     Historical Provider, MD  Fiber CAPS Take 3 capsules by mouth daily.    Historical Provider, MD  fluticasone (FLONASE) 50 MCG/ACT nasal spray Place 2 sprays into both nostrils 2 (two) times daily.    Historical Provider, MD  gabapentin (NEURONTIN) 300 MG capsule Take 300 mg by mouth 2 (two) times daily.    Historical Provider, MD  HYDROcodone-acetaminophen (NORCO/VICODIN) 5-325 MG per tablet Take 1 tablet by mouth every  6 (six) hours as needed. 08/28/14   Marissa Sciacca, PA-C  Linaclotide (LINZESS) 145 MCG CAPS capsule Take 145 mcg by mouth daily.    Historical Provider, MD  mirtazapine (REMERON) 45 MG tablet Take 90 mg by mouth at bedtime.     Historical Provider, MD  multivitamin [facility]) per tablet Take 1 tablet by mouth daily.      Historical Provider, MD  mupirocin cream (BACTROBAN) 2 % Apply 1 application topically 2 (two) times daily. 01/26/14   Fransico Meadow, PA-C  ondansetron (ZOFRAN ODT) 4 MG disintegrating tablet Take 1 tablet (4 mg total) by mouth every 8 (eight) hours as needed for nausea or vomiting. 02/03/14   Hyman Bible, PA-C  pantoprazole  (PROTONIX) 40 MG tablet Take 40 mg by mouth daily.    Historical Provider, MD  polyethylene glycol powder (GLYCOLAX/MIRALAX) powder Take 17 g by mouth daily. Until daily soft stools  OTC 04/02/14   Britt Bottom, NP  sodium phosphate (FLEET) 7-19 GM/118ML ENEM Place 133 mLs (1 enema total) rectally once. 04/02/14   Britt Bottom, NP   BP 130/98 mmHg  Pulse 97  Temp(Src) 98 F (36.7 C) (Oral)  Resp 18  Wt 160 lb (72.576 kg)  SpO2 100%  LMP 08/21/2014 Physical Exam  Constitutional: She is oriented to person, place, and time. She appears well-developed and well-nourished. No distress.  HENT:  Head: Normocephalic and atraumatic.  Eyes: Conjunctivae are normal. No scleral icterus.  Neck: Normal range of motion.  Cardiovascular: Normal rate, regular rhythm and normal heart sounds.  Exam reveals no gallop and no friction rub.   No murmur heard. Pulmonary/Chest: Effort normal and breath sounds normal. No respiratory distress.  Abdominal: Soft. Bowel sounds are normal. She exhibits no distension and no mass. There is no tenderness. There is no guarding.  Genitourinary:  Digital Rectal Exam reveals sphincter with good tone. No external hemorrhoids. No masses or fissures. Stool color is brown with no overt blood.  Patient reports decreased perineal sensation to pinprick and light tough.   Neurological: She is alert and oriented to person, place, and time. She has normal reflexes.  Sig weakness of the R leg with hip flexion and extension.  Weakness with  R dorsi and plantar flexion.  Patient is able to ambulate without footdrop.   Skin: Skin is warm and dry. She is not diaphoretic.  Nursing note and vitals reviewed.   ED Course  Procedures (including critical care time) Labs Review Labs Reviewed  CBC WITH DIFFERENTIAL/PLATELET - Abnormal; Notable for the following:    Hemoglobin 11.0 (*)    HCT 34.8 (*)    MCH 25.8 (*)    RDW 18.3 (*)    All other components within normal  limits  URINE CULTURE  BASIC METABOLIC PANEL  PREGNANCY, URINE    Imaging Review No results found.   EKG Interpretation None      MDM   Final diagnoses:  Right-sided low back pain without sciatica  Right leg weakness  Lumbosacral strain, initial encounter    Patient with concerning symptoms for acute cord compression her exam however appears questionable considering her marked weakness with testing and her ability to ambulate without  Foot drop or other gait abnormality.  I feel  Her sxs warrant emerent MRI and will transfer to [facility].   [facility] the [facility] ED for MRI of the Lumbar spine. I have spoken to Doctor Ray who is the accepting physician.  Margarita Mail, PA-C  09/01/14 [place], MD 09/08/14 1455

## 2014-08-28 NOTE — Discharge Instructions (Signed)
Please call your doctor for a followup appointment within 24-48 hours. When you talk to your doctor please let them know that you were seen in the emergency department and have them acquire all of your records so that they can discuss the findings with you and formulate a treatment plan to fully care for your new and ongoing problems. Please follow-up with primary care provider Please follow-up with orthopedics Please rest and stay hydrated Please avoid any physical or strenuous activity Please apply warm compressions and massage icy hot ointment Please avoid any heavy lifting Please continue to monitor symptoms closely and if symptoms are to worsen or change (fever greater than 101, chills, sweating, nausea, vomiting, chest pain, shortness of breathe, difficulty breathing, weakness, numbness, tingling, worsening or changes to pain pattern, fall, injury, loss of sensation, inability to control urine or bowel movements, worsening or changes to back pain, weakness) please report back to the Emergency Department immediately.  Back Pain, Adult Low back pain is very common. About 1 in 5 people have back pain.The cause of low back pain is rarely dangerous. The pain often gets better over time.About half of people with a sudden onset of back pain feel better in just 2 weeks. About 8 in 10 people feel better by 6 weeks.  CAUSES Some common causes of back pain include:  Strain of the muscles or ligaments supporting the spine.  Wear and tear (degeneration) of the spinal discs.  Arthritis.  Direct injury to the back. DIAGNOSIS Most of the time, the direct cause of low back pain is not known.However, back pain can be treated effectively even when the exact cause of the pain is unknown.Answering your caregiver's questions about your overall health and symptoms is one of the most accurate ways to make sure the cause of your pain is not dangerous. If your caregiver needs more information, he or she may  order lab work or imaging tests (X-rays or MRIs).However, even if imaging tests show changes in your back, this usually does not require surgery. HOME CARE INSTRUCTIONS For many people, back pain returns.Since low back pain is rarely dangerous, it is often a condition that people can learn to [facility] their own.   Remain active. It is stressful on the back to sit or stand in one place. Do not sit, drive, or stand in one place for more than 30 minutes at a time. Take short walks on level surfaces as soon as pain allows.Try to increase the length of time you walk each day.  Do not stay in bed.Resting more than 1 or 2 days can delay your recovery.  Do not avoid exercise or work.Your body is made to move.It is not dangerous to be active, even though your back may hurt.Your back will likely heal faster if you return to being active before your pain is gone.  Pay attention to your body when you bend and lift. Many people have less discomfortwhen lifting if they bend their knees, keep the load close to their bodies,and avoid twisting. Often, the most comfortable positions are those that put less stress on your recovering back.  Find a comfortable position to sleep. Use a firm mattress and lie on your side with your knees slightly bent. If you lie on your back, put a pillow under your knees.  Only take over-the-counter or prescription medicines as directed by your caregiver. Over-the-counter medicines to reduce pain and inflammation are often the most helpful.Your caregiver may prescribe muscle relaxant drugs.These medicines help dull  your pain so you can more quickly return to your normal activities and healthy exercise.  Put ice on the injured area.  Put ice in a plastic bag.  Place a towel between your skin and the bag.  Leave the ice on for 15-20 minutes, 03-04 times a day for the first 2 to 3 days. After that, ice and heat may be alternated to reduce pain and spasms.  Ask your  caregiver about trying back exercises and gentle massage. This may be of some benefit.  Avoid feeling anxious or stressed.Stress increases muscle tension and can worsen back pain.It is important to recognize when you are anxious or stressed and learn ways to manage it.Exercise is a great option. SEEK MEDICAL CARE IF:  You have pain that is not relieved with rest or medicine.  You have pain that does not improve in 1 week.  You have new symptoms.  You are generally not feeling well. SEEK IMMEDIATE MEDICAL CARE IF:   You have pain that radiates from your back into your legs.  You develop new bowel or bladder control problems.  You have unusual weakness or numbness in your arms or legs.  You develop nausea or vomiting.  You develop abdominal pain.  You feel faint. Document Released: 05/23/2005 Document Revised: 11/22/2011 Document Reviewed: 09/24/2013 [employer] [facility] Patient Information 2015 [place], [place]. This information is not intended to replace advice given to you by your health care provider. Make sure you discuss any questions you have with your health care provider.    Emergency Department Resource Guide 1) Find a Doctor and Pay Out of Pocket Although you won't have to find out who is covered by your insurance plan, it is a good idea to ask around and get recommendations. You will then need to call the office and see if the doctor you have chosen will accept you as a new patient and what types of options they offer for patients who are self-pay. Some doctors offer discounts or will set up payment plans for their patients who do not have insurance, but you will need to ask so you aren't surprised when you get to your appointment.  2) Contact Your Local Health Department Not all health departments have doctors that can see patients for sick visits, but many do, so it is worth a call to see if yours does. If you don't know where your local health department is, you can check in your  phone book. The CDC also has a tool to help you locate your state's health department, and many state websites also have listings of all of their local health departments.  3) Find a [facility] If your illness is not likely to be very severe or complicated, you may want to try a walk in clinic. These are popping up all over the country in pharmacies, drugstores, and shopping centers. They're usually staffed by nurse practitioners or physician assistants that have been trained to treat common illnesses and complaints. They're usually fairly quick and inexpensive. However, if you have serious medical issues or chronic medical problems, these are probably not your best option.  No Primary Care Doctor: - Call Health Connect at  [phone] - they can help you locate a primary care doctor that  accepts your insurance, provides certain services, etc. - Physician Referral Service- [phone]  Chronic Pain Problems: Organization         Address  Phone   Notes  [facility]  [phone] Patients need  to be referred by their primary care doctor.   Medication Assistance: Organization         Address  Phone   Notes  [facility] Medication [facility] [address] [place], [place] [address] [phone] --Must be a resident of [facility] -- Must have NO insurance coverage whatsoever (no Medicaid/ Medicare, etc.) -- The pt. MUST have a primary care doctor that directs their care regularly and follows them in the community   MedAssist  [phone]   [employer]  [phone]    Agencies that provide inexpensive medical care: Organization         Address  Phone   Notes  [place]  [phone]   [facility]    [phone]   [facility] [place], [place] [address] [phone]   [place] [address], [place] [phone]   [facility]anned  [facility]    [phone]   [facility]    [phone]   [place] and [place] [address], [place] Phone:  [phone], Fax:  [phone] Hours of Operation:  9 am - 6 pm, M-F.  Also accepts Medicaid/Medicare and self-pay.  [facility] for [place] [address], [address], [place] Phone: [phone], Fax: [phone]. Hours of Operation:  8:30 am - 5:30 pm, M-F.  Also accepts Medicaid and self-pay.  [facility] [place] [address], [place] Phone: [phone]   [place], [place], [place] [phone], Ext. [phone] Mondays & Thursdays: 7-9 AM.  First 15 patients are seen on a first come, first serve basis.    [place] Providers:  Organization         Address  Phone   Notes  [facility] [address], [place] [phone] Also accepts self-pay patients.  [facility] [address], [place]  [phone]   [place], [address], [place] [phone]   [facility] [address], [place] [phone]   Lucianne Lei [address], [place]   [phone] Only accepts [place] Access [place] patients after they have their name applied to their card.   Self-Pay (no insurance) in [facility]:  Organization         Address  Phone   Notes  Sickle Cell Patients, [facility] [place] [phone]   [facility] [place] [phone]   [facility] [place]  [place], [place], [place] [phone]   [facility]/Dr. Osei-Bonsu  [address] [place] or [address], [place] [phone] Phone number for both [place] and [place] locations is the same.    Urgent Medical and [facility] [address] [place] [phone]   [facility] [address] [place] or [address] [phone]   [facility] [address], [place] [phone], phone; [phone], fax Sees patients 1st and 3rd Saturday of every month.  Must not qualify for public or private insurance (i.e. Medicaid, Medicare, [place] Health Choice, Veterans' Benefits)  Household income  should be no more than 200% of the poverty level The clinic cannot treat you if you are pregnant or think you are pregnant  Sexually transmitted diseases are not treated at the clinic.    Dental Care: Organization         Address  Phone  Notes  [facility] Department of [facility] [place] [phone] Accepts children up to age 38 who are enrolled in [place] or [place]; pregnant women with a Medicaid card; and children who have applied for Medicaid or [place] Health Choice, but were declined, whose parents can pay a reduced fee at time of service.  [facility] Department of [facility]  [address], [place] [phone] Accepts children up to age 1 who are enrolled in [place] or [place]; pregnant women with a Medicaid card; and children who have applied for Medicaid or [place] Health Choice, but were declined, whose parents can pay a reduced fee at time of service.  [facility] Adult [facility] Access PROGRAM  [place] [phone] Patients are seen by appointment only. Walk-ins are not accepted. [place] will see patients 13 years of age and older. Monday - Tuesday (8am-5pm) Most Wednesdays (8:30-5pm) $30 per visit, cash only  [facility] Adult Dental Access PROGRAM  [address], [facility] [phone] Patients are seen by appointment only. Walk-ins are not accepted. [place] will see patients 61  years of age and older. One Wednesday Evening (Monthly: Volunteer Based).  $30 per visit, cash only  [place]  [phone] for adults; Children under age 32, call Graduate Pediatric Dentistry at [phone]. Children aged 67-14, please call [phone] to request a pediatric application.  Dental services are provided in all areas of dental care including fillings, crowns and bridges, complete and partial dentures, implants, gum treatment, root canals, and extractions. Preventive care is also provided. Treatment is provided to both adults and children. Patients are selected via a lottery and there is often a waiting list.   [facility] [address] [place]  [phone] [email or web address]   [facility] [address] [place], [place] [phone], Ext. [phone] Second and Fourth Thursday of each month, opens at 6:30 AM; Clinic ends at 9 AM.  Patients are seen on a first-come first-served basis, and a limited number are seen during each clinic.   [facility]  [address] [place], [place] [phone]   Eligibility Requirements You must have lived in [place], [place], or [place] counties for at least the last three months.   You cannot be eligible for state or federal sponsored [employer], including [employer], [place], or [employer].   You generally cannot be eligible for healthcare insurance through your employer.    How to apply: Eligibility screenings are held every Tuesday and Wednesday afternoon from 1:00 pm until 4:00 pm. You do not need an appointment for the interview!  [facility] [address], [place], [place]   [place]  [place] Department  [place]  [phone]    Behavioral Health Resources in the Community: Intensive Outpatient  Programs Organization         Address  Phone  Notes  [place]. [address] [place], [place]  [facility] Outpatient [address] [place], [place]   ADS: [facility] [address] [place], [place]   [place] [address],  [place], [place] or [phone]   Substance Abuse Resources Organization         Address  Phone  Notes  Alcohol and Drug Services  [phone]   [place]  [phone]   [facility] [place]   Chinita Pester  ([phone]   Residential & Outpatient Substance Abuse Program  [phone]   Psychological Services Organization         Address  Phone  Notes  [facility] [place]  [place]  [phone]   [place] [address], [place] or [phone]    Mobile Crisis Teams Organization         Address  Phone  Notes  [facility], Mobile Crisis Care Unit  [phone]   [facility]ive Psychotherapeutic Services  [address] [place], [place]   [place] [address] [place] [phone]    Self-Help/Support Groups Organization         Address  Phone             Notes  [place]. of [place] - variety of support groups  [place] Call for more information  Narcotics Anonymous (NA), [facility]aring [facility] [address], [employer] [place]  2 meetings at this location   Special educational needs teacher         Address  Phone  Notes  [facility] Residential Treatment [place],    [place]  [phone]   [facility]  [address], [place] [address], [place], [place]   [place], [place] [phone] Admissions: 8am-3pm M-F  Incentives Substance [place] [address],    [place], [place]  [phone]   [facility] [address] [place] Community, [place], [place]   [facility] [address]  [place], [place]   [facility] Programs - Intensive Outpatient [address], [place], [place]   [facility] [place].,  [place], [place] [phone] or [phone]   [facility]idential Treatment Services (RTS) [address], [place], [place] Accepts Medicaid  [facility] [place] [address],  [place] [phone] Substance Abuse/Addiction Treatment   [facility] Organization         Address  Phone  Notes  [facility]  [phone]   Domenic Schwab, PhD [address] [place], [place]   [phone] or [phone]   [place], [place] [phone]   [facility] [phone] [place], [place] [phone] Insurance/Medicaid/sponsorship through [facility] and Families [address]                                    [place], [place] [phone] [place] [address], [place] [phone]    Dr. Adele Schilder  [phone]   [facility] Dept. 1) [address], [place] 2) [place] 3)  [place], [place] [phone]  [phone]   [facility] Child Abuse Hotline 604-544-7534 or [phone] (After Hours)

## 2014-08-28 NOTE — ED Provider Notes (Signed)
Patient transferred from [facility].  This provider discussed case in great detail with Margarita Mail, PA-C. Reported that patient needs an MRI of the lumbar spine to be performed without contrast. If negative patient can be discharged home.  Peggy Austin is a 44 year old female with past medical history of hernia, IBS, ulcers, thyroid disease, PIH, asthma, gastroparesis, small bowel obstruction presenting to the ED with lower lumbar back pain that started approximately 2 days ago. Patient reported that she was moving furniture around with her son 2 days ago. Reported that the next morning she was experiencing some pain in her lower aspect of her back, bilateral sides. Reported that as the day progressed the pain went to her buttocks and then radiating down her right leg. Reported that she is unable to feel her right pinky toe. Reported that she has pain in her lower lumbar spine. Reported that she had at least 3 episodes of bowel incontinence yesterday and one episode of bowel incontinence today. Patient reports that she has a history of back surgeries performed by Dr. been an history of T-spine bulging disc. Denied recent injury, nausea, vomiting, abdominal pain, urinary incontinence.  Alert and oriented. GCS 15. When this provider walked into the room, patient was walking around without any difficulty while in the room. Negative ataxic gait. Negative drop foot with walking or dragging. Negative step-offs or sway noted. Full range of motion to upper and lower tremors identified. Equal grip strength. Decreased strength to right lower extremity identified - decreased strength in dorsiflexion and plantar flexion. Pulses palpable and strong. Negative signs of ischemia. Tenderness upon palpation to the mid lumbosacral spine and paraspinal regions bilaterally - healed scar from previous surgery identified with negative abnormalities. Heart rate and rhythm normal. Lungs good auscultation to upper  lower lobes bilaterally. Patient follows commands well. Patient responds to questions appropriately.  CBC negative elevated leukocytosis. Hemoglobin 11.0, hematocrit 34.8. BMP unremarkable. Urine pregnancy negative.  Plan: MRI of lumbar spine.   Medications  HYDROmorphone (DILAUDID) injection 0.5 mg (not administered)  HYDROmorphone (DILAUDID) injection 1 mg (1 mg Intravenous Given 08/28/14 1742)  ondansetron (ZOFRAN) injection 4 mg (4 mg Intravenous Given 08/28/14 1742)    Filed Vitals:   08/28/14 1441 08/28/14 1822  BP: 112/87 127/80  Pulse: 120 104  Temp: 98.4 F (36.9 C) 98.1 F (36.7 C)  TempSrc: Oral Oral  Resp: 20 18  Weight: 160 lb (72.576 kg)   SpO2: 98% 98%   Mr Lumbar Spine Wo Contrast  08/28/2014   CLINICAL DATA:  Initial evaluation for chronic back pain, worsened in past few days status post fall. Also with bowel incontinence.  EXAM: MRI LUMBAR SPINE WITHOUT CONTRAST  TECHNIQUE: Multiplanar, multisequence MR imaging of the lumbar spine was performed. No intravenous contrast was administered.  COMPARISON:  Prior study from 05/25/2014  FINDINGS: Study is somewhat degraded by motion artifact.  For the purposes of this dictation, the lowest well-formed intervertebral disc spaces presumed to be the L5-S1 level, and there presumed to be 5 lumbar type vertebral bodies.  Vertebral bodies are normally aligned with preservation of the normal lumbar lordosis. Vertebral body heights are well maintained. Benign hemangiomas noted within the L2, L4, and L5 vertebral bodies. No other focal osseous lesion. No fracture or listhesis. No marrow edema.  Conus medullaris terminates normally at the L1 level. Signal intensity within the visualized cord is normal. Nerve roots of the cauda equina within normal limits.  Paraspinous soft tissues are within normal  limits. No retroperitoneal adenopathy.  At T11-12 through L2-3, there is no significant disc bulge or disc protrusion. No facet arthrosis. No canal  or foraminal stenosis.  L3-4: No disc bulge or disc protrusion. Intervertebral discs is well hydrated. Mild bilateral facet hypertrophy. No canal or foraminal stenosis.  L4-5: Very mild diffuse disc bulge with disc desiccation. No focal disc herniation. Left greater than right facet arthrosis. Asymmetric fluid noted within the right L4-5 facet. No significant canal or foraminal stenosis.  L5-S1: Degenerative intervertebral disc space narrowing with disc desiccation and associated degenerative endplate changes. Mild bilateral facet arthrosis. There is a superimposed small left paracentral disc protrusion which mildly flattens the left ventral thecal sac. No significant canal stenosis. The protruding disc closely approximates the transiting left S1 nerve root in the left lateral recess, and could potentially result in left lower extremity radicular symptoms (series 900, image 37). There is moderate left foraminal narrowing. Right neural foramen widely patent.  IMPRESSION: 1. No acute abnormality within the lumbar spine or evidence of cord compression. 2. Degenerative disc disease at L5-S1 with superimposed small left paracentral disc protrusion. This closely approximates the transiting left S1 nerve root in the left lateral recess, and could potentially result in left lower extremity radicular symptoms. There is associated moderate left foraminal stenosis at this level without significant canal narrowing. 3. Additional mild degenerative changes as above.   Electronically Signed   By: Jeannine Boga M.D.   On: 08/28/2014 23:05   Lumbar spine MRI no acute abnormalities within the lumbar spine or evidence of cord compression. Degenerative disc disease at L5-S1 with superimposed small left paracentral disc protrusion noted. This closely approximates the transiting left S1 nerve root in the left lateral recess could potentially result in left lower extremity radicular symptoms. Associated moderate left form in all  stenosis at this level without significant canal narrowing. Additional mild degenerative changes as above.  Diagnoses that have been ruled out:  None  Diagnoses that are still under consideration:  None  Final diagnoses:  Right-sided low back pain without sciatica  Right leg weakness  Lumbosacral strain, initial encounter   Lumbar spine MRI without contrast unremarkable for acute cord compression. Unremarkable acute abnormalities noted. Degenerative disc disease noted at L5-S1. Gait proper-negative step-offs or sway. Degenerative disc disease leading to impingement of nerve. Neck with episodes of bowel or urine incontinence while in ED setting. Patient stable, afebrile. Patient not septic appearing. Negative signs of respiratory distress. Discharged patient. Discharged patient with small dose of pain medications-discussed course, precautions, disposal technique. Discharge patient with referral to PCP and orthopedics. Discussed with patient to rest and stay hydrated. Discussed with patient to avoid any physical or strenuous activity. Discussed with patient to closely monitor symptoms and if symptoms are to worsen or change to report back to the ED - strict return instructions given.  Patient agreed to plan of care, understood, all questions answered.   Jamse Mead, PA-C 08/28/14 2351  [address], PA-C 08/28/14 2358  Sherwood Gambler, MD 09/02/14 [phone]

## 2014-08-28 NOTE — ED Notes (Signed)
Back pain. She fell 2 days ago. She has multiple complaints but her most concerning complaint is back pain.

## 2014-08-29 LAB — URINE CULTURE

## 2014-10-22 ENCOUNTER — Emergency Department (HOSPITAL_COMMUNITY)
Admission: EM | Admit: 2014-10-22 | Discharge: 2014-10-22 | Disposition: A | Discharge status: Home / Self Care | Payer: Medicaid Other | Attending: Emergency Medicine | Admitting: Emergency Medicine

## 2014-10-22 ENCOUNTER — Encounter (HOSPITAL_COMMUNITY): Payer: Self-pay | Admitting: Emergency Medicine

## 2014-10-22 DIAGNOSIS — Z8679 Personal history of other diseases of the circulatory system: Secondary | ICD-10-CM | POA: Diagnosis not present

## 2014-10-22 DIAGNOSIS — Z9889 Other specified postprocedural states: Secondary | ICD-10-CM | POA: Diagnosis not present

## 2014-10-22 DIAGNOSIS — M5410 Radiculopathy, site unspecified: Secondary | ICD-10-CM

## 2014-10-22 DIAGNOSIS — Z87828 Personal history of other (healed) physical injury and trauma: Secondary | ICD-10-CM | POA: Insufficient documentation

## 2014-10-22 DIAGNOSIS — E079 Disorder of thyroid, unspecified: Secondary | ICD-10-CM | POA: Diagnosis not present

## 2014-10-22 DIAGNOSIS — M549 Dorsalgia, unspecified: Secondary | ICD-10-CM | POA: Diagnosis present

## 2014-10-22 DIAGNOSIS — K219 Gastro-esophageal reflux disease without esophagitis: Secondary | ICD-10-CM | POA: Insufficient documentation

## 2014-10-22 DIAGNOSIS — Z880 Allergy status to penicillin: Secondary | ICD-10-CM | POA: Insufficient documentation

## 2014-10-22 DIAGNOSIS — Z7951 Long term (current) use of inhaled steroids: Secondary | ICD-10-CM | POA: Insufficient documentation

## 2014-10-22 DIAGNOSIS — G8929 Other chronic pain: Secondary | ICD-10-CM | POA: Insufficient documentation

## 2014-10-22 DIAGNOSIS — M5416 Radiculopathy, lumbar region: Secondary | ICD-10-CM | POA: Diagnosis not present

## 2014-10-22 DIAGNOSIS — Z872 Personal history of diseases of the skin and subcutaneous tissue: Secondary | ICD-10-CM | POA: Diagnosis not present

## 2014-10-22 DIAGNOSIS — Z79899 Other long term (current) drug therapy: Secondary | ICD-10-CM | POA: Diagnosis not present

## 2014-10-22 DIAGNOSIS — Z792 Long term (current) use of antibiotics: Secondary | ICD-10-CM | POA: Diagnosis not present

## 2014-10-22 DIAGNOSIS — R200 Anesthesia of skin: Secondary | ICD-10-CM | POA: Insufficient documentation

## 2014-10-22 DIAGNOSIS — J45909 Unspecified asthma, uncomplicated: Secondary | ICD-10-CM | POA: Diagnosis not present

## 2014-10-22 HISTORY — DX: Other cervical disc degeneration, unspecified cervical region: M50.30

## 2014-10-22 MED ORDER — ONDANSETRON HCL 4 MG/2ML IJ SOLN
4.0000 mg | Freq: Once | INTRAMUSCULAR | Status: AC
  Administered 2014-10-22: 4 mg via INTRAVENOUS
  Filled 2014-10-22: qty 2

## 2014-10-22 MED ORDER — CYCLOBENZAPRINE HCL 10 MG PO TABS: 10.0000 mg | ORAL_TABLET | Freq: Two times a day (BID) | ORAL | Status: DC | PRN

## 2014-10-22 MED ORDER — PREDNISONE 20 MG PO TABS: 40.0000 mg | ORAL_TABLET | Freq: Every day | ORAL | Status: DC

## 2014-10-22 MED ORDER — HYDROMORPHONE HCL 1 MG/ML IJ SOLN
1.0000 mg | Freq: Once | INTRAMUSCULAR | Status: AC
  Administered 2014-10-22: 1 mg via INTRAVENOUS
  Filled 2014-10-22: qty 1

## 2014-10-22 MED ORDER — OXYCODONE-ACETAMINOPHEN 5-325 MG PO TABS: 2.0000 | ORAL_TABLET | ORAL | Status: DC | PRN

## 2014-10-22 MED ORDER — SODIUM CHLORIDE 0.9 % IV BOLUS (SEPSIS)
1000.0000 mL | Freq: Once | INTRAVENOUS | Status: AC
  Administered 2014-10-22: 1000 mL via INTRAVENOUS

## 2014-10-22 MED ORDER — DEXAMETHASONE SODIUM PHOSPHATE 10 MG/ML IJ SOLN
10.0000 mg | Freq: Once | INTRAMUSCULAR | Status: AC
  Administered 2014-10-22: 10 mg via INTRAVENOUS
  Filled 2014-10-22: qty 1

## 2014-10-22 NOTE — ED Notes (Signed)
Pt. reports chronic low back pain for several weeks worse these past several days unrelieved by prescription Tramadol.  , denies recent fall or injury , no urinary discomfort.

## 2014-10-22 NOTE — ED Provider Notes (Signed)
CSN: [id number]     Arrival date & time 10/22/14  1944 History   First MD Initiated Contact with Patient 10/22/14 1954     Chief Complaint  Patient presents with  . Back Pain     (Consider location/radiation/quality/duration/timing/severity/associated sxs/prior Treatment) HPI Comments: Patient is a 44 year old female with a past medical history of chronic back pain from previous injury and IBS following gastric bypass who presents with worsening back pain for the past 3 days. Patient reports moving a heavy chair 6 weeks ago and injuring her back. She reports having an MRI at that time which showed "bulging discs" and was treated with pain medication. Patient reports injuring her back again last week when she got something out of her car. The pain is located in her midline low back with radiation down the right leg. She reports associated numbness in her right foot and lower leg. She has been taking Tramadol for pain without relief. She reports passing watery stool the past few days which she describes as "leaking" but attributes this to her IBS. The pain is similar to her chronic pain, just worse. No alleviating factors. Any movement makes the pain worse.    Past Medical History  Diagnosis Date  . Hernia   . IBS (irritable bowel syndrome)   . Ulcer   . Thyroid disease   . PIH (pregnancy induced hypertension)   . Asthma   . Gastroparesis   . Pancreatitis   . Partial bowel obstruction   . GERD (gastroesophageal reflux disease)   . DDD (degenerative disc disease), cervical    Past Surgical History  Procedure Laterality Date  . Gastric bypass    . Cholecystectomy    . Tubal ligation    . Endometrial ablation    . Hernia repair    . Back surgery    . Cesarean section     No family history on file. History  Substance Use Topics  . Smoking status: Never Smoker   . Smokeless tobacco: Not on file  . Alcohol Use: 0.5 oz/week    1 Standard drinks or equivalent per week     Comment:  occasionally    OB History    No data available     Review of Systems  Constitutional: Negative for fever, chills and fatigue.  HENT: Negative for trouble swallowing.   Eyes: Negative for visual disturbance.  Respiratory: Negative for shortness of breath.   Cardiovascular: Negative for chest pain and palpitations.  Gastrointestinal: Negative for nausea, vomiting, abdominal pain and diarrhea.  Genitourinary: Negative for dysuria and difficulty urinating.  Musculoskeletal: Positive for back pain. Negative for arthralgias and neck pain.  Skin: Negative for color change.  Neurological: Positive for numbness. Negative for dizziness and weakness.  Psychiatric/Behavioral: Negative for dysphoric mood.      Allergies  Amoxicillin; Avelox; Doxycycline; Morphine; Penicillins; Salmon; and Moxifloxacin  Home Medications   Prior to Admission medications   Medication Sig Start Date End Date Taking? Authorizing Provider  albuterol (PROVENTIL HFA;VENTOLIN HFA) 108 (90 BASE) MCG/ACT inhaler Inhale 1-2 puffs into the lungs every 6 (six) hours as needed for wheezing or shortness of breath.    Historical Provider, MD  albuterol (PROVENTIL) (2.5 MG/3ML) 0.083% nebulizer solution Take 2.5 mg by nebulization every 6 (six) hours as needed for wheezing or shortness of breath.    Historical Provider, MD  amphetamine-dextroamphetamine (ADDERALL) 20 MG tablet Take 20 mg by mouth 2 (two) times daily as needed. For nerves per patient  Historical Provider, MD  clonazePAM (KLONOPIN) 1 MG tablet Take 1 mg by mouth 2 (two) times daily as needed for anxiety.    Historical Provider, MD  ferrous sulfate dried (SLOW FE) 160 (50 FE) MG TBCR Take 160 mg by mouth 3 (three) times daily with meals.     Historical Provider, MD  Fiber CAPS Take 3 capsules by mouth daily.    Historical Provider, MD  fluticasone (FLONASE) 50 MCG/ACT nasal spray Place 2 sprays into both nostrils 2 (two) times daily.    Historical Provider, MD   gabapentin (NEURONTIN) 300 MG capsule Take 300 mg by mouth 2 (two) times daily.    Historical Provider, MD  HYDROcodone-acetaminophen (NORCO/VICODIN) 5-325 MG per tablet Take 1 tablet by mouth every 6 (six) hours as needed. 08/28/14   Marissa Sciacca, PA-C  Levothyroxine Sodium (SYNTHROID PO) Take by mouth.    Historical Provider, MD  Linaclotide Rolan Lipa) 145 MCG CAPS capsule Take 145 mcg by mouth daily.    Historical Provider, MD  mirtazapine (REMERON) 45 MG tablet Take 90 mg by mouth at bedtime.     Historical Provider, MD  multivitamin [facility]) per tablet Take 1 tablet by mouth daily.      Historical Provider, MD  mupirocin cream (BACTROBAN) 2 % Apply 1 application topically 2 (two) times daily. 01/26/14   Fransico Meadow, PA-C  ondansetron (ZOFRAN ODT) 4 MG disintegrating tablet Take 1 tablet (4 mg total) by mouth every 8 (eight) hours as needed for nausea or vomiting. 02/03/14   Hyman Bible, PA-C  pantoprazole (PROTONIX) 40 MG tablet Take 40 mg by mouth daily.    Historical Provider, MD  polyethylene glycol powder (GLYCOLAX/MIRALAX) powder Take 17 g by mouth daily. Until daily soft stools  OTC 04/02/14   Britt Bottom, NP  sodium phosphate (FLEET) 7-19 GM/118ML ENEM Place 133 mLs (1 enema total) rectally once. 04/02/14   Britt Bottom, NP   BP 148/100 mmHg  Pulse 86  Temp(Src) 97.8 F (36.6 C) (Oral)  Resp 16  Ht 5\' 2"  (1.575 m)  Wt 155 lb (70.308 kg)  BMI 28.34 kg/m2  SpO2 100% Physical Exam  Constitutional: She is oriented to person, place, and time. She appears well-developed and well-nourished. No distress.  HENT:  Head: Normocephalic and atraumatic.  Eyes: Conjunctivae and EOM are normal.  Neck: Normal range of motion. Neck supple.  Cardiovascular: Normal rate and regular rhythm.  Exam reveals no gallop and no friction rub.   No murmur heard. Pulmonary/Chest: Effort normal and breath sounds normal. She has no wheezes. She has no rales. She exhibits no  tenderness.  Abdominal: Soft. She exhibits no distension. There is no tenderness. There is no rebound.  Musculoskeletal: Normal range of motion.  Lower lumbar midline spine tenderness to palpation. No other midline spine tenderness.   Neurological: She is alert and oriented to person, place, and time. Coordination normal.  Patient unable to feel sharp sensation over the lower lateral right leg and 4th and 5th toes of the right foot. Sensation intact to sharp and light touch of the rest of the right leg and the entire left leg. Speech is goal-oriented. Moves limbs without ataxia. Patient is able to ambulate.   Skin: Skin is warm and dry.  Psychiatric: She has a normal mood and affect. Her behavior is normal.  Nursing note and vitals reviewed.   ED Course  Procedures (including critical care time) Labs Review Labs Reviewed - No data to display  Imaging Review No  results found.   EKG Interpretation None      MDM   Final diagnoses:  Radicular low back pain    9:21 PM Patient with a history of chronic back pain and previous injury. Patient likely has radicular back pain and will be treated with IV pain medication and steroids. Patient has paresthesias in an S1 radicular pattern of the right leg. Vitals stable and patient afebrile. No bladder/bowel incontinence or saddle paresthesias. Patient able to ambulate with pain. No concern for cauda equina at this time.   11:01 PM Patient feeling much better and will be discharged with prednisone, Percocet, and flexeril. Patient will follow up with her doctor. No further evaluation needed at this time.   Alvina Chou, PA-C 10/22/14 [place], MD 10/23/14 Shelah Lewandowsky

## 2014-10-22 NOTE — ED Notes (Signed)
Pt c/o headache, states shes lost control of her bowels, has numbness in her legs.

## 2014-10-22 NOTE — Discharge Instructions (Signed)
Take prednisone as directed until gone. Take percocet as needed for pain. Take Flexeril as needed for muscle spasm. You may take these medications together. Refer to attached documents for more information.

## 2014-12-01 ENCOUNTER — Other Ambulatory Visit (HOSPITAL_COMMUNITY): Payer: Self-pay | Admitting: Specialist

## 2014-12-01 DIAGNOSIS — M5440 Lumbago with sciatica, unspecified side: Secondary | ICD-10-CM

## 2014-12-09 ENCOUNTER — Ambulatory Visit (HOSPITAL_COMMUNITY)
Admission: RE | Admit: 2014-12-09 | Discharge: 2014-12-09 | Disposition: A | Discharge status: Home / Self Care | Payer: Medicaid Other | Source: Ambulatory Visit | Attending: Specialist | Admitting: Specialist

## 2014-12-09 DIAGNOSIS — M5440 Lumbago with sciatica, unspecified side: Secondary | ICD-10-CM | POA: Insufficient documentation

## 2014-12-09 DIAGNOSIS — M479 Spondylosis, unspecified: Secondary | ICD-10-CM | POA: Insufficient documentation

## 2014-12-09 DIAGNOSIS — Z9889 Other specified postprocedural states: Secondary | ICD-10-CM | POA: Insufficient documentation

## 2014-12-09 MED ORDER — GADOBENATE DIMEGLUMINE 529 MG/ML IV SOLN
15.0000 mL | Freq: Once | INTRAVENOUS | Status: AC | PRN
  Administered 2014-12-09: 14 mL via INTRAVENOUS

## 2015-01-22 ENCOUNTER — Emergency Department (HOSPITAL_COMMUNITY)
Admission: EM | Admit: 2015-01-22 | Discharge: 2015-01-23 | Disposition: A | Discharge status: Home / Self Care | Payer: Medicaid Other | Attending: Emergency Medicine | Admitting: Emergency Medicine

## 2015-01-22 ENCOUNTER — Emergency Department (HOSPITAL_COMMUNITY): Payer: Medicaid Other

## 2015-01-22 ENCOUNTER — Encounter (HOSPITAL_COMMUNITY): Payer: Self-pay | Admitting: Family Medicine

## 2015-01-22 DIAGNOSIS — Z880 Allergy status to penicillin: Secondary | ICD-10-CM | POA: Insufficient documentation

## 2015-01-22 DIAGNOSIS — Z79899 Other long term (current) drug therapy: Secondary | ICD-10-CM | POA: Diagnosis not present

## 2015-01-22 DIAGNOSIS — Z8739 Personal history of other diseases of the musculoskeletal system and connective tissue: Secondary | ICD-10-CM | POA: Diagnosis not present

## 2015-01-22 DIAGNOSIS — J45901 Unspecified asthma with (acute) exacerbation: Secondary | ICD-10-CM | POA: Insufficient documentation

## 2015-01-22 DIAGNOSIS — E079 Disorder of thyroid, unspecified: Secondary | ICD-10-CM | POA: Diagnosis not present

## 2015-01-22 DIAGNOSIS — K219 Gastro-esophageal reflux disease without esophagitis: Secondary | ICD-10-CM | POA: Diagnosis not present

## 2015-01-22 DIAGNOSIS — R1013 Epigastric pain: Secondary | ICD-10-CM

## 2015-01-22 LAB — BASIC METABOLIC PANEL
ANION GAP: 10 (ref 5–15)
BUN: 12 mg/dL (ref 6–20)
CALCIUM: 9.1 mg/dL (ref 8.9–10.3)
CHLORIDE: 110 mmol/L (ref 101–111)
CO2: 17 mmol/L — ABNORMAL LOW (ref 22–32)
CREATININE: 0.69 mg/dL (ref 0.44–1.00)
GFR calc non Af Amer: >60 mL/min (ref >60)
Glucose, Bld: 180 mg/dL — ABNORMAL HIGH (ref 65–99)
Potassium: 4.6 mmol/L (ref 3.5–5.1)
SODIUM: 137 mmol/L (ref 135–145)

## 2015-01-22 LAB — CBC
HCT: 34.6 % — ABNORMAL LOW (ref 36.0–46.0)
Hemoglobin: 11 g/dL — ABNORMAL LOW (ref 12.0–15.0)
MCH: 25.8 pg — ABNORMAL LOW (ref 26.0–34.0)
MCHC: 31.8 g/dL (ref 30.0–36.0)
MCV: 81 fL (ref 78.0–100.0)
PLATELETS: 302 10*3/uL (ref 150–400)
RBC: 4.27 MIL/uL (ref 3.87–5.11)
RDW: 17 % — ABNORMAL HIGH (ref 11.5–15.5)
WBC: 7 10*3/uL (ref 4.0–10.5)

## 2015-01-22 LAB — I-STAT TROPONIN, ED: TROPONIN I, POC: 0 ng/mL (ref 0.00–0.08)

## 2015-01-22 LAB — LIPASE, BLOOD: LIPASE: 47 U/L (ref 22–51)

## 2015-01-22 MED ORDER — ONDANSETRON HCL 4 MG/2ML IJ SOLN
4.0000 mg | Freq: Once | INTRAMUSCULAR | Status: AC
  Administered 2015-01-22: 4 mg via INTRAVENOUS
  Filled 2015-01-22: qty 2

## 2015-01-22 MED ORDER — SODIUM CHLORIDE 0.9 % IV BOLUS (SEPSIS)
1000.0000 mL | Freq: Once | INTRAVENOUS | Status: AC
  Administered 2015-01-22: 1000 mL via INTRAVENOUS

## 2015-01-22 MED ORDER — HYDROMORPHONE HCL 1 MG/ML IJ SOLN
1.0000 mg | Freq: Once | INTRAMUSCULAR | Status: AC
  Administered 2015-01-22: 1 mg via INTRAVENOUS
  Filled 2015-01-22: qty 1

## 2015-01-22 MED ORDER — IOHEXOL 300 MG/ML  SOLN
25.0000 mL | Freq: Once | INTRAMUSCULAR | Status: AC | PRN
  Administered 2015-01-22: 25 mL via ORAL

## 2015-01-22 MED ORDER — IOHEXOL 300 MG/ML  SOLN
80.0000 mL | Freq: Once | INTRAMUSCULAR | Status: AC | PRN
  Administered 2015-01-22: 100 mL via INTRAVENOUS

## 2015-01-22 NOTE — ED Provider Notes (Signed)
CSN: [id number]     Arrival date & time 01/22/15  1823 History   First MD Initiated Contact with Patient 01/22/15 2005     Chief Complaint  Patient presents with  . Abdominal Pain  . Shortness of Breath     (Consider location/radiation/quality/duration/timing/severity/associated sxs/prior Treatment) Patient is a 44 y.o. female presenting with abdominal pain and shortness of breath. The history is provided by the patient.  Abdominal Pain Pain location:  Epigastric Pain quality: sharp and shooting   Pain radiates to:  Does not radiate Pain severity:  Severe Onset quality:  Sudden Duration:  2 days Timing:  Constant Progression:  Worsening Chronicity:  Recurrent Relieved by:  Nothing Worsened by:  Nothing tried Ineffective treatments:  None tried Associated symptoms: shortness of breath   Associated symptoms: no chest pain, no chills, no dysuria, no fever, no nausea and no vomiting   Shortness of Breath Associated symptoms: abdominal pain   Associated symptoms: no chest pain, no fever, no headaches, no vomiting and no wheezing    44 yo F with a chief complaint of epigastric abdominal pain. This been going on for 2 days. Patient states it feels like her pancreatitis in the past. Patient with subjective fevers chills at home nauseated but not vomiting. Denies any blood in her stool or dark stools. States it doesn't feel like her normal abdominal pains. Patient has been seen for multiple visits and is on multiple different pain medications for these abdominal issues.  Past Medical History  Diagnosis Date  . Hernia   . IBS (irritable bowel syndrome)   . Ulcer   . Thyroid disease   . PIH (pregnancy induced hypertension)   . Asthma   . Gastroparesis   . Pancreatitis   . Partial bowel obstruction   . GERD (gastroesophageal reflux disease)   . DDD (degenerative disc disease), cervical    Past Surgical History  Procedure Laterality Date  . Gastric bypass    . Cholecystectomy     . Tubal ligation    . Endometrial ablation    . Hernia repair    . Back surgery    . Cesarean section     History reviewed. No pertinent family history. Social History  Substance Use Topics  . Smoking status: Never Smoker   . Smokeless tobacco: None  . Alcohol Use: 0.5 oz/week    1 Standard drinks or equivalent per week     Comment: occasionally    OB History    No data available     Review of Systems  Constitutional: Negative for fever and chills.  HENT: Negative for congestion and rhinorrhea.   Eyes: Negative for redness and visual disturbance.  Respiratory: Positive for shortness of breath. Negative for wheezing.   Cardiovascular: Negative for chest pain and palpitations.  Gastrointestinal: Positive for abdominal pain. Negative for nausea and vomiting.  Genitourinary: Negative for dysuria and urgency.  Musculoskeletal: Negative for myalgias and arthralgias.  Skin: Negative for pallor and wound.  Neurological: Negative for dizziness and headaches.      Allergies  Amoxicillin; Avelox; Doxycycline; Morphine; Penicillins; Salmon; and Moxifloxacin  Home Medications   Prior to Admission medications   Medication Sig Start Date End Date Taking? Authorizing Provider  albuterol (PROVENTIL HFA;VENTOLIN HFA) 108 (90 BASE) MCG/ACT inhaler Inhale 1-2 puffs into the lungs every 6 (six) hours as needed for wheezing or shortness of breath.   Yes Historical Provider, MD  albuterol (PROVENTIL) (2.5 MG/3ML) 0.083% nebulizer solution Take 2.5 mg by  nebulization every 6 (six) hours as needed for wheezing or shortness of breath.   Yes Historical Provider, MD  amphetamine-dextroamphetamine (ADDERALL) 20 MG tablet Take 20 mg by mouth 3 (three) times daily as needed. For nerves per patient   Yes Historical Provider, MD  clonazePAM (KLONOPIN) 1 MG tablet Take 2 mg by mouth at bedtime.    Yes Historical Provider, MD  ferrous sulfate dried (SLOW FE) 160 (50 FE) MG TBCR Take 160 mg by mouth 3  (three) times daily with meals.    Yes Historical Provider, MD  gabapentin (NEURONTIN) 300 MG capsule Take 900 mg by mouth daily.   Yes Historical Provider, MD  glucosamine-chondroitin 500-400 MG tablet Take 1 tablet by mouth daily.   Yes Historical Provider, MD  levothyroxine (SYNTHROID, LEVOTHROID) 50 MCG tablet Take 50 mcg by mouth daily before breakfast.   Yes Historical Provider, MD  Linaclotide (LINZESS) 145 MCG CAPS capsule Take 145 mcg by mouth daily.   Yes Historical Provider, MD  multivitamin [facility]) per tablet Take 2 tablets by mouth daily.    Yes Historical Provider, MD  oxyCODONE-acetaminophen (PERCOCET/ROXICET) 5-325 MG per tablet Take 2 tablets by mouth every 4 (four) hours as needed for severe pain. 10/22/14  Yes Kaitlyn Szekalski, PA-C  pantoprazole (PROTONIX) 40 MG tablet Take 40 mg by mouth daily.   Yes Historical Provider, MD  promethazine (PHENERGAN) 25 MG tablet Take 25 mg by mouth every 6 (six) hours as needed for nausea or vomiting.   Yes Historical Provider, MD  cyclobenzaprine (FLEXERIL) 10 MG tablet Take 1 tablet (10 mg total) by mouth 2 (two) times daily as needed for muscle spasms. Patient not taking: Reported on 01/22/2015 10/22/14   Alvina Chou, PA-C  HYDROcodone-acetaminophen (NORCO/VICODIN) 5-325 MG per tablet Take 1 tablet by mouth every 6 (six) hours as needed. Patient not taking: Reported on 10/22/2014 08/28/14   Marissa Sciacca, PA-C  mupirocin cream (BACTROBAN) 2 % Apply 1 application topically 2 (two) times daily. Patient not taking: Reported on 10/22/2014 01/26/14   Fransico Meadow, PA-C  ondansetron (ZOFRAN ODT) 4 MG disintegrating tablet Take 1 tablet (4 mg total) by mouth every 8 (eight) hours as needed for nausea or vomiting. Patient not taking: Reported on 10/22/2014 02/03/14   Hyman Bible, PA-C  polyethylene glycol powder (GLYCOLAX/MIRALAX) powder Take 17 g by mouth daily. Until daily soft stools  OTC Patient not taking: Reported on 10/22/2014  04/02/14   Britt Bottom, NP  predniSONE (DELTASONE) 20 MG tablet Take 2 tablets (40 mg total) by mouth daily. Patient not taking: Reported on 01/22/2015 10/22/14   Alvina Chou, PA-C  sodium phosphate (FLEET) 7-19 GM/118ML ENEM Place 133 mLs (1 enema total) rectally once. Patient not taking: Reported on 10/22/2014 04/02/14   Britt Bottom, NP   BP 124/77 mmHg  Pulse 82  Temp(Src) 98.5 F (36.9 C) (Oral)  Resp 16  SpO2 100% Physical Exam  Constitutional: She is oriented to person, place, and time. She appears well-developed and well-nourished. No distress.  HENT:  Head: Normocephalic and atraumatic.  Eyes: EOM are normal. Pupils are equal, round, and reactive to light.  Neck: Normal range of motion. Neck supple.  Cardiovascular: Normal rate and regular rhythm.  Exam reveals no gallop and no friction rub.   No murmur heard. Pulmonary/Chest: Effort normal. She has no wheezes. She has no rales.  Abdominal: Soft. She exhibits no distension. There is tenderness (Pain significant out of proportion to exam.). There is no rebound and no guarding.  Musculoskeletal: She exhibits no edema or tenderness.  Neurological: She is alert and oriented to person, place, and time.  Skin: Skin is warm and dry. She is not diaphoretic.  Psychiatric: She has a normal mood and affect. Her behavior is normal.    ED Course  Procedures (including critical care time) Labs Review Labs Reviewed  BASIC METABOLIC PANEL - Abnormal; Notable for the following:    CO2 17 (*)    Glucose, Bld 180 (*)    All other components within normal limits  CBC - Abnormal; Notable for the following:    Hemoglobin 11.0 (*)    HCT 34.6 (*)    MCH 25.8 (*)    RDW 17.0 (*)    All other components within normal limits  LIPASE, BLOOD  HEPATIC FUNCTION PANEL  I-STAT TROPOININ, ED    Imaging Review Dg Chest 2 View  01/22/2015   CLINICAL DATA:  Chest pain and shortness of breath for 5 days.  EXAM: CHEST  2 VIEW   COMPARISON:  07/26/2014  FINDINGS: The cardiac silhouette, mediastinal and hilar contours are within normal limits and stable. The lungs are clear of acute process. No pleural effusions. The bony thorax is intact.  IMPRESSION: No acute cardiopulmonary findings.   Electronically Signed   By: Marijo Sanes M.D.   On: 01/22/2015 19:31   Ct Abdomen Pelvis W Contrast  01/22/2015   CLINICAL DATA:  Acute onset of epigastric abdominal pain, radiating to the back. Weakness. Initial encounter.  EXAM: CT ABDOMEN AND PELVIS WITH CONTRAST  TECHNIQUE: Multidetector CT imaging of the abdomen and pelvis was performed using the standard protocol following bolus administration of intravenous contrast.  CONTRAST:  138mL OMNIPAQUE IOHEXOL 300 MG/ML  SOLN  COMPARISON:  CT of the abdomen and pelvis performed 01/16/2015  FINDINGS: Minimal right basilar atelectasis is noted. The patient is status post gastric bypass. The gastrojejunal anastomosis is unremarkable in appearance. The distal stomach is decompressed and difficult to fully assess.  The patient is status post cholecystectomy, with clips noted at the gallbladder fossa. There is intrahepatic biliary ductal dilatation, and dilatation of the common bile duct to 1.9 cm, which is stable from prior studies and may reflect the patient's baseline. However, would correlate for any evidence of post-cholecystectomy syndrome. The liver and spleen are otherwise unremarkable. The pancreas and adrenal glands are unremarkable.  The kidneys are unremarkable in appearance. There is no evidence of hydronephrosis. No renal or ureteral stones are seen. No perinephric stranding is appreciated.  No free fluid is identified. The small bowel is unremarkable in appearance. A transient small bowel intussusception is noted at the mid abdomen. The stomach is within normal limits. No acute vascular abnormalities are seen. A retroaortic left renal vein is noted.  The appendix is normal in caliber, without  evidence of appendicitis. The colon is unremarkable in appearance, and is partially filled with liquid stool and air.  The bladder is mildly distended and grossly unremarkable. The uterus is within normal limits. The ovaries are grossly symmetric. No suspicious adnexal masses are seen. No inguinal lymphadenopathy is seen.  No acute osseous abnormalities are identified. Mild facet disease is noted at the lower lumbar spine.  IMPRESSION: 1. No definite acute abnormality seen to explain the patient's symptoms. 2. Stable dilatation of the common bile duct to 1.9 cm status post cholecystectomy, with underlying dilatation of the intrahepatic biliary ducts. This may reflect the patient's baseline, though would correlate for any evidence of post-cholecystectomy syndrome.  Electronically Signed   By: Garald Balding M.D.   On: 01/22/2015 23:33   I have personally reviewed and evaluated these images and lab results as part of my medical decision-making.   EKG Interpretation None      MDM   Final diagnoses:  Epigastric abdominal pain    44 yo F with a chief complaint of epigastric abdominal pain. Patient almost jumped off the table before I was able to touch her abdomen. Possibly drug seeking. Will obtain a CT scan to rule out intra-abdominal process.  CT scan negative. Lab results unremarkable. We'll discharge the patient home for just that she follows up with her PCP for further evaluation. 12:07 AM:  I have discussed the diagnosis/risks/treatment options with the patient and believe the pt to be eligible for discharge home to follow-up with PCP. We also discussed returning to the ED immediately if new or worsening sx occur. We discussed the sx which are most concerning (e.g., sudden worsening pain) that necessitate immediate return. Medications administered to the patient during their visit and any new prescriptions provided to the patient are listed below.  Medications given during this  visit Medications  sodium chloride 0.9 % bolus 1,000 mL (0 mLs Intravenous Stopped 01/22/15 2319)  HYDROmorphone (DILAUDID) injection 1 mg (1 mg Intravenous Given 01/22/15 2212)  ondansetron (ZOFRAN) injection 4 mg (4 mg Intravenous Given 01/22/15 2212)  iohexol (OMNIPAQUE) 300 MG/ML solution 25 mL (25 mLs Oral Contrast Given 01/22/15 2228)  iohexol (OMNIPAQUE) 300 MG/ML solution 80 mL (100 mLs Intravenous Contrast Given 01/22/15 2304)    New Prescriptions   No medications on file     The patient appears reasonably screen and/or stabilized for discharge and I doubt any other medical condition or other [facility] requiring further screening, evaluation, or treatment in the ED at this time prior to discharge.    Deno Etienne, DO 01/23/15 0007

## 2015-01-22 NOTE — ED Notes (Signed)
Pt sts she is having epigastric pain radiating into her back and radiating down her stomach. sts also some SOB with the pain. sts pressure and she feels weak.

## 2015-01-22 NOTE — ED Notes (Signed)
Pt was asleep in waiting room when tech went to get her.

## 2015-01-23 MED ORDER — ONDANSETRON HCL 4 MG/2ML IJ SOLN
4.0000 mg | Freq: Once | INTRAMUSCULAR | Status: AC
  Administered 2015-01-23: 4 mg via INTRAVENOUS
  Filled 2015-01-23: qty 2

## 2015-01-23 NOTE — ED Notes (Signed)
Pt verbalizes understanding of d/c instructions and denies any further needs at this time. 

## 2015-01-23 NOTE — Discharge Instructions (Signed)

## 2015-02-19 ENCOUNTER — Emergency Department (HOSPITAL_COMMUNITY): Payer: Medicaid Other

## 2015-02-19 ENCOUNTER — Encounter (HOSPITAL_COMMUNITY): Payer: Self-pay

## 2015-02-19 ENCOUNTER — Emergency Department (HOSPITAL_COMMUNITY)
Admission: EM | Admit: 2015-02-19 | Discharge: 2015-02-19 | Disposition: A | Discharge status: Home / Self Care | Payer: Medicaid Other | Attending: Emergency Medicine | Admitting: Emergency Medicine

## 2015-02-19 DIAGNOSIS — R110 Nausea: Secondary | ICD-10-CM | POA: Insufficient documentation

## 2015-02-19 DIAGNOSIS — R1084 Generalized abdominal pain: Secondary | ICD-10-CM | POA: Insufficient documentation

## 2015-02-19 DIAGNOSIS — Z8739 Personal history of other diseases of the musculoskeletal system and connective tissue: Secondary | ICD-10-CM | POA: Diagnosis not present

## 2015-02-19 DIAGNOSIS — G8929 Other chronic pain: Secondary | ICD-10-CM | POA: Insufficient documentation

## 2015-02-19 DIAGNOSIS — Z880 Allergy status to penicillin: Secondary | ICD-10-CM | POA: Insufficient documentation

## 2015-02-19 DIAGNOSIS — Z792 Long term (current) use of antibiotics: Secondary | ICD-10-CM | POA: Insufficient documentation

## 2015-02-19 DIAGNOSIS — E079 Disorder of thyroid, unspecified: Secondary | ICD-10-CM | POA: Diagnosis not present

## 2015-02-19 DIAGNOSIS — R1013 Epigastric pain: Secondary | ICD-10-CM | POA: Insufficient documentation

## 2015-02-19 DIAGNOSIS — J45909 Unspecified asthma, uncomplicated: Secondary | ICD-10-CM | POA: Diagnosis not present

## 2015-02-19 DIAGNOSIS — Z79899 Other long term (current) drug therapy: Secondary | ICD-10-CM | POA: Insufficient documentation

## 2015-02-19 DIAGNOSIS — K589 Irritable bowel syndrome without diarrhea: Secondary | ICD-10-CM | POA: Diagnosis not present

## 2015-02-19 DIAGNOSIS — K219 Gastro-esophageal reflux disease without esophagitis: Secondary | ICD-10-CM | POA: Diagnosis not present

## 2015-02-19 LAB — URINALYSIS W MICROSCOPIC (NOT AT ARMC)
BILIRUBIN URINE: NEGATIVE
GLUCOSE, UA: NEGATIVE mg/dL
HGB URINE DIPSTICK: NEGATIVE
KETONES UR: NEGATIVE mg/dL
NITRITE: NEGATIVE
PROTEIN: NEGATIVE mg/dL
Specific Gravity, Urine: 1.025 (ref 1.005–1.030)
UROBILINOGEN UA: 1 mg/dL (ref 0.0–1.0)
pH: 6 (ref 5.0–8.0)

## 2015-02-19 LAB — CBC WITH DIFFERENTIAL/PLATELET
BASOS PCT: 1 %
Basophils Absolute: 0 10*3/uL (ref 0.0–0.1)
EOS ABS: 0.2 10*3/uL (ref 0.0–0.7)
EOS PCT: 3 %
HCT: 33.3 % — ABNORMAL LOW (ref 36.0–46.0)
Hemoglobin: 10.4 g/dL — ABNORMAL LOW (ref 12.0–15.0)
LYMPHS ABS: 1.4 10*3/uL (ref 0.7–4.0)
Lymphocytes Relative: 27 %
MCH: 26 pg (ref 26.0–34.0)
MCHC: 31.2 g/dL (ref 30.0–36.0)
MCV: 83.3 fL (ref 78.0–100.0)
MONOS PCT: 6 %
Monocytes Absolute: 0.3 10*3/uL (ref 0.1–1.0)
NEUTROS PCT: 63 %
Neutro Abs: 3.4 10*3/uL (ref 1.7–7.7)
PLATELETS: 236 10*3/uL (ref 150–400)
RBC: 4 MIL/uL (ref 3.87–5.11)
RDW: 16.4 % — ABNORMAL HIGH (ref 11.5–15.5)
WBC: 5.4 10*3/uL (ref 4.0–10.5)

## 2015-02-19 LAB — COMPREHENSIVE METABOLIC PANEL
ALBUMIN: 3 g/dL — AB (ref 3.5–5.0)
ALT: 14 U/L (ref 14–54)
ANION GAP: 8 (ref 5–15)
AST: 21 U/L (ref 15–41)
Alkaline Phosphatase: 59 U/L (ref 38–126)
BUN: 9 mg/dL (ref 6–20)
CHLORIDE: 111 mmol/L (ref 101–111)
CO2: 21 mmol/L — AB (ref 22–32)
Calcium: 8.1 mg/dL — ABNORMAL LOW (ref 8.9–10.3)
Creatinine, Ser: 0.59 mg/dL (ref 0.44–1.00)
GFR calc non Af Amer: >60 mL/min (ref >60)
GLUCOSE: 94 mg/dL (ref 65–99)
Potassium: 3.9 mmol/L (ref 3.5–5.1)
SODIUM: 140 mmol/L (ref 135–145)
Total Bilirubin: 0.2 mg/dL — ABNORMAL LOW (ref 0.3–1.2)
Total Protein: 5.5 g/dL — ABNORMAL LOW (ref 6.5–8.1)

## 2015-02-19 LAB — I-STAT CG4 LACTIC ACID, ED
Lactic Acid, Venous: 1.76 mmol/L (ref 0.5–2.0)
Lactic Acid, Venous: 0.76 mmol/L (ref 0.5–2.0)

## 2015-02-19 LAB — LIPASE, BLOOD: Lipase: 27 U/L (ref 22–51)

## 2015-02-19 MED ORDER — SODIUM CHLORIDE 0.9 % IV BOLUS (SEPSIS)
1000.0000 mL | Freq: Once | INTRAVENOUS | Status: AC
  Administered 2015-02-19: 1000 mL via INTRAVENOUS

## 2015-02-19 MED ORDER — ACETAMINOPHEN 500 MG PO TABS
1000.0000 mg | ORAL_TABLET | Freq: Once | ORAL | Status: AC
  Administered 2015-02-19: 1000 mg via ORAL
  Filled 2015-02-19: qty 2

## 2015-02-19 MED ORDER — HYDROMORPHONE HCL 1 MG/ML IJ SOLN
1.0000 mg | Freq: Once | INTRAMUSCULAR | Status: AC
  Administered 2015-02-19: 1 mg via INTRAVENOUS
  Filled 2015-02-19: qty 1

## 2015-02-19 MED ORDER — ONDANSETRON HCL 4 MG/2ML IJ SOLN
4.0000 mg | Freq: Once | INTRAMUSCULAR | Status: AC
  Administered 2015-02-19: 4 mg via INTRAVENOUS
  Filled 2015-02-19: qty 2

## 2015-02-19 MED ORDER — HALOPERIDOL LACTATE 5 MG/ML IJ SOLN
5.0000 mg | Freq: Once | INTRAMUSCULAR | Status: AC
  Administered 2015-02-19: 5 mg via INTRAMUSCULAR
  Filled 2015-02-19: qty 1

## 2015-02-19 MED ORDER — ONDANSETRON HCL 4 MG PO TABS: 4.0000 mg | ORAL_TABLET | Freq: Three times a day (TID) | ORAL | Status: DC | PRN

## 2015-02-19 MED ORDER — METOCLOPRAMIDE HCL 5 MG/ML IJ SOLN
10.0000 mg | Freq: Once | INTRAMUSCULAR | Status: AC
  Administered 2015-02-19: 10 mg via INTRAVENOUS
  Filled 2015-02-19: qty 2

## 2015-02-19 NOTE — ED Provider Notes (Signed)
CSN: [id number]     Arrival date & time 02/19/15  1429 History   First MD Initiated Contact with Patient 02/19/15 1505     Chief Complaint  Patient presents with  . Abdominal Pain     (Consider location/radiation/quality/duration/timing/severity/associated sxs/prior Treatment) HPI  44 year old female who presents with abdominal pain. History of hernia repair, IBS, gastric bypass, cholecystectomy, prior history of bowel obstruction, and chronic back pain. Reports onset of generalized abdominal pain worse in the epigastrium for the past several weeks. I was seen in the ED 3 weeks ago for similar abdominal pain and had a negative CT abdomen pelvis. Reports pain has been consistent associated with constipation where she only has a bowel movement every 5-7 days. Had a small bowel movement today and continues to pass gas. Has had nausea without vomiting. Denies melena or hematemesis. Noted BRBPR with straining to have a BM a week ago, but resolved on it's own. Denies dysuria or frequency. Notes this is similar to pancreatitis.   Past Medical History  Diagnosis Date  . Hernia   . IBS (irritable bowel syndrome)   . Ulcer   . Thyroid disease   . PIH (pregnancy induced hypertension)   . Asthma   . Gastroparesis   . Pancreatitis   . Partial bowel obstruction   . GERD (gastroesophageal reflux disease)   . DDD (degenerative disc disease), cervical    Past Surgical History  Procedure Laterality Date  . Gastric bypass    . Cholecystectomy    . Tubal ligation    . Endometrial ablation    . Hernia repair    . Back surgery    . Cesarean section     History reviewed. No pertinent family history. Social History  Substance Use Topics  . Smoking status: Never Smoker   . Smokeless tobacco: None  . Alcohol Use: 0.5 oz/week    1 Standard drinks or equivalent per week     Comment: occasionally    OB History    No data available     Review of Systems 10/14 systems reviewed and are negative  other than those stated in the HPI    Allergies  Amoxicillin; Avelox; Doxycycline; Morphine; Penicillins; Salmon; and Moxifloxacin  Home Medications   Prior to Admission medications   Medication Sig Start Date End Date Taking? Authorizing Provider  albuterol (PROVENTIL HFA;VENTOLIN HFA) 108 (90 BASE) MCG/ACT inhaler Inhale 1-2 puffs into the lungs every 6 (six) hours as needed for wheezing or shortness of breath.   Yes Historical Provider, MD  albuterol (PROVENTIL) (2.5 MG/3ML) 0.083% nebulizer solution Take 2.5 mg by nebulization every 6 (six) hours as needed for wheezing or shortness of breath.   Yes Historical Provider, MD  clonazePAM (KLONOPIN) 1 MG tablet Take 2 mg by mouth at bedtime.    Yes Historical Provider, MD  ferrous sulfate dried (SLOW FE) 160 (50 FE) MG TBCR Take 160 mg by mouth 3 (three) times daily with meals.    Yes Historical Provider, MD  gabapentin (NEURONTIN) 300 MG capsule Take 900 mg by mouth daily.   Yes Historical Provider, MD  glucosamine-chondroitin 500-400 MG tablet Take 1 tablet by mouth daily.   Yes Historical Provider, MD  levothyroxine (SYNTHROID, LEVOTHROID) 50 MCG tablet Take 50 mcg by mouth daily before breakfast.   Yes Historical Provider, MD  Linaclotide (LINZESS) 145 MCG CAPS capsule Take 145 mcg by mouth daily.   Yes Historical Provider, MD  methylphenidate 54 MG PO CR tablet Take 54  mg by mouth every morning.   Yes Historical Provider, MD  multivitamin [facility]) per tablet Take 2 tablets by mouth daily.    Yes Historical Provider, MD  pantoprazole (PROTONIX) 40 MG tablet Take 40 mg by mouth daily.   Yes Historical Provider, MD  promethazine (PHENERGAN) 25 MG tablet Take 25 mg by mouth every 6 (six) hours as needed for nausea or vomiting.   Yes Historical Provider, MD  cyclobenzaprine (FLEXERIL) 10 MG tablet Take 1 tablet (10 mg total) by mouth 2 (two) times daily as needed for muscle spasms. Patient not taking: Reported on 01/22/2015 10/22/14   Alvina Chou, PA-C  HYDROcodone-acetaminophen (NORCO/VICODIN) 5-325 MG per tablet Take 1 tablet by mouth every 6 (six) hours as needed. Patient not taking: Reported on 10/22/2014 08/28/14   Marissa Sciacca, PA-C  mupirocin cream (BACTROBAN) 2 % Apply 1 application topically 2 (two) times daily. Patient not taking: Reported on 10/22/2014 01/26/14   Fransico Meadow, PA-C  ondansetron (ZOFRAN ODT) 4 MG disintegrating tablet Take 1 tablet (4 mg total) by mouth every 8 (eight) hours as needed for nausea or vomiting. Patient not taking: Reported on 10/22/2014 02/03/14   Hyman Bible, PA-C  ondansetron (ZOFRAN) 4 MG tablet Take 1 tablet (4 mg total) by mouth every 8 (eight) hours as needed for nausea or vomiting. 02/19/15   Forde Dandy, MD  oxyCODONE-acetaminophen (PERCOCET/ROXICET) 5-325 MG per tablet Take 2 tablets by mouth every 4 (four) hours as needed for severe pain. Patient not taking: Reported on 02/19/2015 10/22/14   Alvina Chou, PA-C  polyethylene glycol powder (GLYCOLAX/MIRALAX) powder Take 17 g by mouth daily. Until daily soft stools  OTC Patient not taking: Reported on 10/22/2014 04/02/14   Britt Bottom, NP  predniSONE (DELTASONE) 20 MG tablet Take 2 tablets (40 mg total) by mouth daily. Patient not taking: Reported on 01/22/2015 10/22/14   Alvina Chou, PA-C  sodium phosphate (FLEET) 7-19 GM/118ML ENEM Place 133 mLs (1 enema total) rectally once. Patient not taking: Reported on 10/22/2014 04/02/14   Britt Bottom, NP   BP 132/89 mmHg  Pulse 78  Temp(Src) 98 F (36.7 C) (Oral)  Resp 16  SpO2 98% Physical Exam Physical Exam  Nursing note and vitals reviewed. Constitutional: Well developed, well nourished, non-toxic, and in no acute distress Head: Normocephalic and atraumatic.  Mouth/Throat: Oropharynx is clear and moist.  Neck: Normal range of motion. Neck supple.  Cardiovascular: Normal rate and regular rhythm.   Pulmonary/Chest: Effort normal and breath sounds normal.   Abdominal: Soft. Non-distended. There is diffuse tenderness, worst in the epigastrium. There is no rebound and no guarding.  Musculoskeletal: Normal range of motion.  Neurological: Alert, no facial droop, fluent speech, moves all extremities symmetrically Skin: Skin is warm and dry.  Psychiatric: Cooperative  ED Course  Procedures (including critical care time) Labs Review Labs Reviewed  CBC WITH DIFFERENTIAL/PLATELET - Abnormal; Notable for the following:    Hemoglobin 10.4 (*)    HCT 33.3 (*)    RDW 16.4 (*)    All other components within normal limits  COMPREHENSIVE METABOLIC PANEL - Abnormal; Notable for the following:    CO2 21 (*)    Calcium 8.1 (*)    Total Protein 5.5 (*)    Albumin 3.0 (*)    Total Bilirubin 0.2 (*)    All other components within normal limits  URINALYSIS W MICROSCOPIC - Abnormal; Notable for the following:    APPearance CLOUDY (*)    Leukocytes, UA MODERATE (*)  Bacteria, UA MANY (*)    Squamous Epithelial / LPF MANY (*)    All other components within normal limits  LIPASE, BLOOD  I-STAT CG4 LACTIC ACID, ED  I-STAT CG4 LACTIC ACID, ED    Imaging Review Dg Abd Acute W/chest  02/19/2015   CLINICAL DATA:  Abdominal pain and nausea  EXAM: DG ABDOMEN ACUTE W/ 1V CHEST  COMPARISON:  Chest radiograph January 22, 2015; CT abdomen and pelvis January 22, 2015  FINDINGS: PA chest: Lungs are clear. Heart size and pulmonary vascularity are normal. No adenopathy.  Supine and upright abdomen: There is moderate stool throughout colon diffusely. There is no bowel dilatation or air-fluid level suggesting obstruction. No free air. There are surgical clips in the upper abdomen.  IMPRESSION: Moderate stool throughout the colon diffusely. No obstruction or free air. Lungs clear.   Electronically Signed   By: Lowella Grip III M.D.   On: 02/19/2015 16:32   I have personally reviewed and evaluated these images and lab results as part of my medical decision-making.    EKG Interpretation None      MDM   Final diagnoses:  Generalized abdominal pain   History of multiple abdominal surgeries with CT abd/pelvis less than one month ago, that was unremarkable. Concern for narcotic seeking behavior. She has a soft abdomen. With barely touching her, she winces in extreme pain, but when she is showing me the location of her abdominal pain she is palpating her abdomen herself, and does not appear to be in pain. This makes me less suspicious for serious etiology of symptoms. Initially tachycardic, but HR improves with fluids. Initially did receive 1 mg dilaudid and zofran. CBC, CMP, lipase, lactate and UA unremarkable. XR abdomen shows moderate fecal loading. No CT imaging felt necessary given symptoms have been ongoing since last CT 3-4 weeks ago that was unremarkable. Discussed with patient that fecal loading may also play a role in today's symptoms and narcotics would make that worse. She asked for one more dose of pain medication prior to discharge, stating "I just wanted to get drugged up before I leave." We discussed that that was inappropriate, and she broke out into tears, stating "You can't just ignore my pain." We discussed non-narcotic alternatives, and given haldol and tylenol. Discussed follow-up with her GI physician and PCP. Strict return and follow-up instructions reviewed. She expressed understanding of all discharge instructions and felt comfortable with the plan of care.       Forde Dandy, MD 02/20/15 [phone]

## 2015-02-19 NOTE — Discharge Instructions (Signed)
Start taking your MiraLAX on a daily basis, as your constipation may play a role in your symptoms today. Return for worsening symptoms, including vomiting unable to keep down food or fluids, fever, bloody or black stools worsening pain/vomiting, , or any other symptoms concerning to you.  Abdominal Pain Many things can cause belly (abdominal) pain. Most times, the belly pain is not dangerous. Many cases of belly pain can be watched and treated at home. HOME CARE   Do not take medicines that help you go poop (laxatives) unless told to by your doctor.  Only take medicine as told by your doctor.  Eat or drink as told by your doctor. Your doctor will tell you if you should be on a special diet. GET HELP IF:  You do not know what is causing your belly pain.  You have belly pain while you are sick to your stomach (nauseous) or have runny poop (diarrhea).  You have pain while you pee or poop.  Your belly pain wakes you up at night.  You have belly pain that gets worse or better when you eat.  You have belly pain that gets worse when you eat fatty foods.  You have a fever. GET HELP RIGHT AWAY IF:   The pain does not go away within 2 hours.  You keep throwing up (vomiting).  The pain changes and is only in the right or left part of the belly.  You have bloody or tarry looking poop. MAKE SURE YOU:   Understand these instructions.  Will watch your condition.  Will get help right away if you are not doing well or get worse. Document Released: 11/09/2007 Document Revised: 05/28/2013 Document Reviewed: 01/30/2013 [facility] Patient Information 2015 [place], [place]. This information is not intended to replace advice given to you by your health care provider. Make sure you discuss any questions you have with your health care provider.

## 2015-02-19 NOTE — ED Notes (Signed)
Per EMS - went to dr office for abd pain x couple days. Extensive abdominal hx - gastric bypass, c-section x2, pancreatitis, blockages. BP 134/91, hr 110bpm, rr 20, 99% RA. 20G Right FA. 30mg  toradol, 4mg  zofran.

## 2015-02-19 NOTE — ED Notes (Signed)
Pt c/o generalized abdominal pain - reports pain feeling similar to when she has had pancreatitis issues.

## 2015-03-25 ENCOUNTER — Encounter (HOSPITAL_COMMUNITY): Payer: Self-pay

## 2015-03-25 ENCOUNTER — Emergency Department (HOSPITAL_COMMUNITY)
Admission: EM | Admit: 2015-03-25 | Discharge: 2015-03-26 | Disposition: A | Discharge status: Home / Self Care | Payer: Medicaid Other | Attending: Emergency Medicine | Admitting: Emergency Medicine

## 2015-03-25 DIAGNOSIS — R451 Restlessness and agitation: Secondary | ICD-10-CM

## 2015-03-25 DIAGNOSIS — F438 Other reactions to severe stress: Secondary | ICD-10-CM | POA: Diagnosis not present

## 2015-03-25 DIAGNOSIS — F1110 Opioid abuse, uncomplicated: Secondary | ICD-10-CM | POA: Diagnosis not present

## 2015-03-25 DIAGNOSIS — Z880 Allergy status to penicillin: Secondary | ICD-10-CM | POA: Diagnosis not present

## 2015-03-25 DIAGNOSIS — F1310 Sedative, hypnotic or anxiolytic abuse, uncomplicated: Secondary | ICD-10-CM | POA: Diagnosis not present

## 2015-03-25 DIAGNOSIS — J45909 Unspecified asthma, uncomplicated: Secondary | ICD-10-CM | POA: Insufficient documentation

## 2015-03-25 DIAGNOSIS — E079 Disorder of thyroid, unspecified: Secondary | ICD-10-CM | POA: Insufficient documentation

## 2015-03-25 DIAGNOSIS — K219 Gastro-esophageal reflux disease without esophagitis: Secondary | ICD-10-CM | POA: Insufficient documentation

## 2015-03-25 DIAGNOSIS — Z8739 Personal history of other diseases of the musculoskeletal system and connective tissue: Secondary | ICD-10-CM | POA: Insufficient documentation

## 2015-03-25 DIAGNOSIS — F1510 Other stimulant abuse, uncomplicated: Secondary | ICD-10-CM | POA: Diagnosis not present

## 2015-03-25 DIAGNOSIS — Z79899 Other long term (current) drug therapy: Secondary | ICD-10-CM | POA: Insufficient documentation

## 2015-03-25 DIAGNOSIS — Z872 Personal history of diseases of the skin and subcutaneous tissue: Secondary | ICD-10-CM | POA: Insufficient documentation

## 2015-03-25 DIAGNOSIS — Z3202 Encounter for pregnancy test, result negative: Secondary | ICD-10-CM | POA: Insufficient documentation

## 2015-03-25 DIAGNOSIS — T887XXA Unspecified adverse effect of drug or medicament, initial encounter: Secondary | ICD-10-CM | POA: Diagnosis not present

## 2015-03-25 DIAGNOSIS — R45851 Suicidal ideations: Secondary | ICD-10-CM | POA: Diagnosis present

## 2015-03-25 DIAGNOSIS — Z7951 Long term (current) use of inhaled steroids: Secondary | ICD-10-CM | POA: Insufficient documentation

## 2015-03-25 HISTORY — DX: Other lesions of oral mucosa: K13.79

## 2015-03-25 LAB — I-STAT BETA HCG BLOOD, ED (MC, WL, AP ONLY)

## 2015-03-25 LAB — RAPID URINE DRUG SCREEN, HOSP PERFORMED
AMPHETAMINES: POSITIVE — AB
BENZODIAZEPINES: POSITIVE — AB
Barbiturates: NOT DETECTED
COCAINE: NOT DETECTED
OPIATES: POSITIVE — AB
TETRAHYDROCANNABINOL: NOT DETECTED

## 2015-03-25 LAB — CBC
HCT: 32.8 % — ABNORMAL LOW (ref 36.0–46.0)
HEMOGLOBIN: 10.5 g/dL — AB (ref 12.0–15.0)
MCH: 25.7 pg — AB (ref 26.0–34.0)
MCHC: 32 g/dL (ref 30.0–36.0)
MCV: 80.4 fL (ref 78.0–100.0)
Platelets: 299 10*3/uL (ref 150–400)
RBC: 4.08 MIL/uL (ref 3.87–5.11)
RDW: 16.1 % — ABNORMAL HIGH (ref 11.5–15.5)
WBC: 5.1 10*3/uL (ref 4.0–10.5)

## 2015-03-25 LAB — ETHANOL: Alcohol, Ethyl (B): <5 mg/dL (ref <5)

## 2015-03-25 LAB — COMPREHENSIVE METABOLIC PANEL
ALT: 36 U/L (ref 14–54)
AST: 56 U/L — AB (ref 15–41)
Albumin: 3.7 g/dL (ref 3.5–5.0)
Alkaline Phosphatase: 75 U/L (ref 38–126)
Anion gap: 7 (ref 5–15)
BUN: 18 mg/dL (ref 6–20)
CHLORIDE: 109 mmol/L (ref 101–111)
CO2: 23 mmol/L (ref 22–32)
CREATININE: 0.71 mg/dL (ref 0.44–1.00)
Calcium: 9.5 mg/dL (ref 8.9–10.3)
GFR calc non Af Amer: >60 mL/min (ref >60)
Glucose, Bld: 102 mg/dL — ABNORMAL HIGH (ref 65–99)
Potassium: 4.1 mmol/L (ref 3.5–5.1)
SODIUM: 139 mmol/L (ref 135–145)
Total Bilirubin: 0.4 mg/dL (ref 0.3–1.2)
Total Protein: 6.7 g/dL (ref 6.5–8.1)

## 2015-03-25 LAB — ACETAMINOPHEN LEVEL: Acetaminophen (Tylenol), Serum: <10 ug/mL — ABNORMAL LOW (ref 10–30)

## 2015-03-25 LAB — SALICYLATE LEVEL

## 2015-03-25 MED ORDER — LEVOTHYROXINE SODIUM 50 MCG PO TABS
50.0000 ug | ORAL_TABLET | Freq: Every day | ORAL | Status: DC
  Administered 2015-03-26: 50 ug via ORAL
  Filled 2015-03-25 (×2): qty 1

## 2015-03-25 MED ORDER — POLYETHYLENE GLYCOL 3350 17 G PO PACK
17.0000 g | PACK | Freq: Two times a day (BID) | ORAL | Status: DC | PRN
  Filled 2015-03-25: qty 1

## 2015-03-25 MED ORDER — PANTOPRAZOLE SODIUM 40 MG PO TBEC
40.0000 mg | DELAYED_RELEASE_TABLET | Freq: Every day | ORAL | Status: DC
  Administered 2015-03-26: 40 mg via ORAL
  Filled 2015-03-25: qty 1

## 2015-03-25 MED ORDER — LINACLOTIDE 145 MCG PO CAPS
145.0000 ug | ORAL_CAPSULE | Freq: Every day | ORAL | Status: DC
  Administered 2015-03-26: 145 ug via ORAL
  Filled 2015-03-25: qty 1

## 2015-03-25 MED ORDER — FLUTICASONE PROPIONATE 50 MCG/ACT NA SUSP
2.0000 | Freq: Two times a day (BID) | NASAL | Status: DC
  Administered 2015-03-26: 2 via NASAL
  Filled 2015-03-25: qty 16

## 2015-03-25 MED ORDER — DOCUSATE SODIUM 100 MG PO CAPS
100.0000 mg | ORAL_CAPSULE | Freq: Three times a day (TID) | ORAL | Status: DC | PRN
  Filled 2015-03-25: qty 1

## 2015-03-25 MED ORDER — ALBUTEROL SULFATE HFA 108 (90 BASE) MCG/ACT IN AERS: 1.0000 | INHALATION_SPRAY | Freq: Four times a day (QID) | RESPIRATORY_TRACT | Status: DC | PRN

## 2015-03-25 MED ORDER — TRAMADOL HCL 50 MG PO TABS
50.0000 mg | ORAL_TABLET | Freq: Two times a day (BID) | ORAL | Status: DC | PRN
  Administered 2015-03-25: 50 mg via ORAL
  Filled 2015-03-25 (×2): qty 1

## 2015-03-25 MED ORDER — ONDANSETRON HCL 4 MG PO TABS: 4.0000 mg | ORAL_TABLET | Freq: Three times a day (TID) | ORAL | Status: DC | PRN

## 2015-03-25 MED ORDER — CLONAZEPAM 1 MG PO TABS
2.0000 mg | ORAL_TABLET | Freq: Every day | ORAL | Status: DC
  Administered 2015-03-25: 2 mg via ORAL
  Filled 2015-03-25: qty 2

## 2015-03-25 MED ORDER — POLYETHYLENE GLYCOL 3350 17 GM/SCOOP PO POWD: 17.0000 g | Freq: Two times a day (BID) | ORAL | Status: DC | PRN

## 2015-03-25 MED ORDER — FLUOXETINE HCL 20 MG PO TABS
10.0000 mg | ORAL_TABLET | Freq: Every day | ORAL | Status: DC
  Filled 2015-03-25: qty 1

## 2015-03-25 MED ORDER — FERROUS SULFATE 325 (65 FE) MG PO TABS
325.0000 mg | ORAL_TABLET | Freq: Every day | ORAL | Status: DC
  Administered 2015-03-26: 325 mg via ORAL
  Filled 2015-03-25 (×2): qty 1

## 2015-03-25 NOTE — ED Provider Notes (Signed)
CSN: [id number]     Arrival date & time 03/25/15  1719 History   First MD Initiated Contact with Patient 03/25/15 2001     Chief Complaint  Patient presents with  . Psychiatric Evaluation  . Suicidal   HPI Patient presents to the emergency room for evaluation of suicidal ideation and depression. Patient states that that is not the main issue. Patient notes that she's had some generalized depression but she thinks she is having some issues with a medication reaction. Take medications for pain because of some dental issues. She has been prescribed tramadol as well as Tylenol 3. Patient has taken all of the tramadol tablets that were prescribed on October 10. She's also finished the 12 tablets of Tylenol No. 3 that she was given on October 17. Family feels the patient is acting somewhat erratically. She shaking back and forth and scratching her face. He has not been sleeping well. When the patient was confronted about her location overuse and that it could kill her the patient told her sister "thats the point".  I asked the patient about that conversation and she says she was just mad at her sister. Patient denies any drug or alcohol use.  Past Medical History  Diagnosis Date  . Hernia   . IBS (irritable bowel syndrome)   . Ulcer   . Thyroid disease   . PIH (pregnancy induced hypertension)   . Asthma   . Gastroparesis   . Pancreatitis   . Partial bowel obstruction ([place])   . GERD (gastroesophageal reflux disease)   . DDD (degenerative disc disease), cervical   . Recurrent oral ulcers    Past Surgical History  Procedure Laterality Date  . Gastric bypass    . Cholecystectomy    . Tubal ligation    . Endometrial ablation    . Hernia repair    . Back surgery    . Cesarean section     History reviewed. No pertinent family history. Social History  Substance Use Topics  . Smoking status: Never Smoker   . Smokeless tobacco: Never Used  . Alcohol Use: No     Comment:     OB History    No data available     Review of Systems  All other systems reviewed and are negative.     Allergies  Amoxicillin; Avelox; Doxycycline; Morphine; Penicillins; Salmon; and Moxifloxacin  Home Medications   Prior to Admission medications   Medication Sig Start Date End Date Taking? Authorizing Provider  albuterol (PROVENTIL HFA;VENTOLIN HFA) 108 (90 BASE) MCG/ACT inhaler Inhale 1-2 puffs into the lungs every 6 (six) hours as needed for wheezing or shortness of breath.   Yes Historical Provider, MD  albuterol (PROVENTIL) (2.5 MG/3ML) 0.083% nebulizer solution Take 2.5 mg by nebulization every 6 (six) hours as needed for wheezing or shortness of breath.   Yes Historical Provider, MD  amphetamine-dextroamphetamine (ADDERALL) 20 MG tablet Take 20 mg by mouth 3 (three) times daily.   Yes Historical Provider, MD  clonazePAM (KLONOPIN) 1 MG tablet Take 2 mg by mouth at bedtime.    Yes Historical Provider, MD  Cyanocobalamin (B-12 PO) Take 1 tablet by mouth daily.   Yes Historical Provider, MD  cyclobenzaprine (FLEXERIL) 10 MG tablet Take 1 tablet (10 mg total) by mouth 2 (two) times daily as needed for muscle spasms. 10/22/14  Yes Kaitlyn Szekalski, PA-C  dicyclomine (BENTYL) 10 MG capsule Take 10 mg by mouth daily.   Yes Historical Provider, MD  docusate  sodium (COLACE) 100 MG capsule Take 100 mg by mouth 3 (three) times daily as needed for mild constipation.   Yes Historical Provider, MD  ferrous sulfate dried (SLOW FE) 160 (50 FE) MG TBCR Take 160 mg by mouth 3 (three) times daily with meals.    Yes Historical Provider, MD  FLUoxetine (PROZAC) 10 MG tablet Take 10 mg by mouth daily.   Yes Historical Provider, MD  fluticasone (FLONASE) 50 MCG/ACT nasal spray Place 2 sprays into both nostrils 2 (two) times daily.   Yes Historical Provider, MD  gabapentin (NEURONTIN) 300 MG capsule Take 900 mg by mouth at bedtime as needed (depending on how tired she is).    Yes Historical Provider, MD  levothyroxine  (SYNTHROID, LEVOTHROID) 50 MCG tablet Take 50 mcg by mouth daily before breakfast.   Yes Historical Provider, MD  Linaclotide (LINZESS) 145 MCG CAPS capsule Take 145 mcg by mouth daily.   Yes Historical Provider, MD  multivitamin [facility]) per tablet Take 3 tablets by mouth daily.    Yes Historical Provider, MD  mupirocin cream (BACTROBAN) 2 % Apply 1 application topically 2 (two) times daily. Patient taking differently: Apply 1 application topically 2 (two) times daily as needed (flare up).  01/26/14  Yes Hollace Kinnier Sofia, PA-C  omeprazole (PRILOSEC) 20 MG capsule Take 20 mg by mouth daily.   Yes Historical Provider, MD  ondansetron (ZOFRAN) 4 MG tablet Take 1 tablet (4 mg total) by mouth every 8 (eight) hours as needed for nausea or vomiting. 02/19/15  Yes Forde Dandy, MD  pantoprazole (PROTONIX) 40 MG tablet Take 40 mg by mouth daily.   Yes Historical Provider, MD  polyethylene glycol powder (GLYCOLAX/MIRALAX) powder Take 17 g by mouth daily. Until daily soft stools  OTC Patient taking differently: Take 17 g by mouth 2 (two) times daily as needed for moderate constipation.  04/02/14  Yes Britt Bottom, NP  promethazine (PHENERGAN) 25 MG tablet Take 25 mg by mouth every 6 (six) hours as needed for nausea or vomiting.   Yes Historical Provider, MD  sodium phosphate (FLEET) 7-19 GM/118ML ENEM Place 133 mLs (1 enema total) rectally once. Patient taking differently: Place 1 enema rectally daily as needed for moderate constipation.  04/02/14  Yes Britt Bottom, NP  traMADol (ULTRAM) 50 MG tablet Take 50 mg by mouth every 12 (twelve) hours as needed for moderate pain.   Yes Historical Provider, MD  HYDROcodone-acetaminophen (NORCO/VICODIN) 5-325 MG per tablet Take 1 tablet by mouth every 6 (six) hours as needed. Patient not taking: Reported on 10/22/2014 08/28/14   Marissa Sciacca, PA-C  ondansetron (ZOFRAN ODT) 4 MG disintegrating tablet Take 1 tablet (4 mg total) by mouth every 8 (eight) hours  as needed for nausea or vomiting. Patient not taking: Reported on 10/22/2014 02/03/14   Hyman Bible, PA-C  oxyCODONE-acetaminophen (PERCOCET/ROXICET) 5-325 MG per tablet Take 2 tablets by mouth every 4 (four) hours as needed for severe pain. Patient not taking: Reported on 02/19/2015 10/22/14   Alvina Chou, PA-C  predniSONE (DELTASONE) 20 MG tablet Take 2 tablets (40 mg total) by mouth daily. Patient not taking: Reported on 01/22/2015 10/22/14   Alvina Chou, PA-C   There were no vitals taken for this visit. Physical Exam  Constitutional: She appears well-developed and well-nourished. No distress.  HENT:  Head: Normocephalic and atraumatic.  Right Ear: External ear normal.  Left Ear: External ear normal.  Eyes: Conjunctivae are normal. Right eye exhibits no discharge. Left eye exhibits no discharge.  No scleral icterus.  Neck: Neck supple. No tracheal deviation present.  Cardiovascular: Normal rate, regular rhythm and intact distal pulses.   Pulmonary/Chest: Effort normal and breath sounds normal. No stridor. No respiratory distress. She has no wheezes. She has no rales.  Abdominal: Soft. Bowel sounds are normal. She exhibits no distension. There is no tenderness. There is no rebound and no guarding.  Musculoskeletal: She exhibits no edema or tenderness.  Neurological: She is alert. No cranial nerve deficit (no facial droop, extraocular movements intact, no slurred speech) or sensory deficit. She exhibits normal muscle tone. She displays no seizure activity. Coordination normal.  Pt appears hyperactive, fidgeting in bed.    Skin: Skin is warm and dry. No rash noted.  Psychiatric: She has a normal mood and affect. She is hyperactive. She does not exhibit a depressed mood.  Nursing note and vitals reviewed.   ED Course  Procedures (including critical care time) Labs Review Labs Reviewed  COMPREHENSIVE METABOLIC PANEL - Abnormal; Notable for the following:    Glucose, Bld 102 (*)     AST 56 (*)    All other components within normal limits  ACETAMINOPHEN LEVEL - Abnormal; Notable for the following:    Acetaminophen (Tylenol), Serum <10 (*)    All other components within normal limits  CBC - Abnormal; Notable for the following:    Hemoglobin 10.5 (*)    HCT 32.8 (*)    MCH 25.7 (*)    RDW 16.1 (*)    All other components within normal limits  URINE RAPID DRUG SCREEN, HOSP PERFORMED - Abnormal; Notable for the following:    Opiates POSITIVE (*)    Benzodiazepines POSITIVE (*)    Amphetamines POSITIVE (*)    All other components within normal limits  ETHANOL  SALICYLATE LEVEL  I-STAT BETA HCG BLOOD, ED ([facility], [facility], [facility] ONLY)      MDM    drug screen is positive for benzo and opiates and amphetamines but she denies drug use.  Amphetamine use could be causing these symptoms.  Labs are otherwise unremarkable.  Stable for psychiatric evaluation    Dorie Rank, MD 03/25/15 2038
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
## 2015-03-25 NOTE — ED Notes (Addendum)
Patient's sister states the patient "was not acting right" 3 days ago. Patient was constantly rocking back and forth, scratching her face frequently, inability to stay still, tal;king constantly, and inability to sleep. Patient states she has only been diagnosed with generalized depression and states she was recently started on Prozac. Patient's sister states that the dentist states that she has been taking an enormous amount of pain meds. Tramadol bottle that was filled on 03/16/15 with 60 tablets is empty,Tylenol #3 that was filled on 03/22/16 12 tablets-bottle empty, and hydrocodone tablets unknown amount. patient's sister stated that the patient was confronted about the narcotic use and was told that was enough to kill someone, patient replied, "That's the point."

## 2015-03-25 NOTE — ED Notes (Signed)
Patient denies SI, HI and AVH at this time. Patient states "My sister was bothering me and I said something I shouldn't said". Plan of care discussed with patient. Patient voices no complaints or concerns at this time. Encouragement and support provided and safety maintain. Q 15 min safety checks remain in place.

## 2015-03-25 NOTE — ED Notes (Signed)
DELAY IN LABS. PT IS IN RESTROOM.

## 2015-03-26 ENCOUNTER — Encounter (HOSPITAL_COMMUNITY): Payer: Self-pay | Admitting: Registered Nurse

## 2015-03-26 DIAGNOSIS — T887XXA Unspecified adverse effect of drug or medicament, initial encounter: Secondary | ICD-10-CM | POA: Diagnosis not present

## 2015-03-26 MED ORDER — BUPROPION HCL ER (XL) 150 MG PO TB24: 150.0000 mg | ORAL_TABLET | Freq: Every day | ORAL | Status: DC

## 2015-03-26 MED ORDER — BUPROPION HCL ER (XL) 150 MG PO TB24
150.0000 mg | ORAL_TABLET | Freq: Every day | ORAL | Status: DC
  Filled 2015-03-26: qty 1

## 2015-03-26 MED ORDER — FLUOXETINE HCL 10 MG PO CAPS
10.0000 mg | ORAL_CAPSULE | Freq: Every day | ORAL | Status: DC
  Administered 2015-03-26: 10 mg via ORAL
  Filled 2015-03-26: qty 1

## 2015-03-26 NOTE — Consult Note (Signed)
[facility] Consult   Reason for Consult:  Bizarre behavior Referring Physician:  EDP Patient Identification: Peggy Austin MRN:  [id number] Principal Diagnosis: Medication side effect Diagnosis:   Patient Active Problem List   Diagnosis Date Noted  . Medication side effect [T88.7XXA] 03/26/2015  . PREGNANCY-INDUCED HYPERTENSION [IMO0002] 06/28/2010  . GESTATIONAL DIABETES [O99.810] 06/28/2010  . DYSPNEA ON EXERTION [R06.09, R09.89] 06/28/2010  . OBESITY [E66.9] 09/14/2009  . CONSTIPATION [K59.00] 09/14/2009  . NAUSEA AND VOMITING [R11.2] 09/14/2009  . NAUSEA ALONE [R11.0] 09/14/2009  . FLATULENCE [R14.3, R14.1, R14.2] 09/14/2009  . ABDOMINAL PAIN, GENERALIZED [R10.84] 09/14/2009  . INTESTINAL MALABSORPTION, POSTSURGICAL [K91.2] 06/10/2008  . SKIN LESIONS, MULTIPLE [L98.9] 06/10/2008  . PRIOR LAPAROSCOPIC ROUX-EN-BARIATRIC SURGERY [Z98.84] 03/17/2008  . ANEMIA-UNSPECIFIED [D64.9] 02/13/2008  . IRRITABLE BOWEL SYNDROME [K58.9] 02/13/2008  . ABDOMINAL PAIN-MULTIPLE SITES [R10.9] 02/13/2008    Total Time spent with patient: 45 minutes  Subjective:   Peggy Austin is a 44 y.o. female patient.  HPI:  Patient states that her family said that she had been acting strange and not acting herself.  States that her personality has changed and that she was acting like someone different and that her kids also noticed it.  Patient states that she does not feel any different and has not notice any difference in her behavior or actions other that having some confusion at times.  States that she has had brief episodes of suicidal thoughts no plan and no intent but mostly related to her medical condition.  States that she has followed up with her outpatient provider and was recently started on Prozac.  Patient also states that she take Tramadol twice daily (discussed with patient that Prozac and the use of Tramadol could be related to SSRI increase and could be the cause of the  behaviors that the family is noticing)  Advised that best not to take SSRI while on Tramadol and understanding voiced. Will discontinue Prozac and start Wellbutrin 150 mg Q am.  Patient will follow up with outpatient provider.  Patient also denies homicidal ideation, psychosis, and paranoia.   Past Psychiatric History: Major Depressive Disorder, Outpatient/Inpatient psych history.  Prior psychotropic medication.  Risk to Self: Suicidal Ideation: No-Not Currently/Within Last 6 Months Suicidal Intent: No-Not Currently/Within Last 6 Months (Describes passing thoughts.) Is patient at risk for suicide?: Yes Suicidal Plan?: No-Not Currently/Within Last 6 Months Access to Means: No What has been your use of drugs/alcohol within the last 12 months?: None How many times?: 0 Other Self Harm Risks: Pt denies Triggers for Past Attempts: None known Intentional Self Injurious Behavior: None Risk to Others: Homicidal Ideation: No Thoughts of Harm to Others: No Current Homicidal Intent: No Current Homicidal Plan: No Access to Homicidal Means: No Identified Victim: No one History of harm to others?: No Assessment of Violence: None Noted Violent Behavior Description: None reported Does patient have access to weapons?: Yes (Comment) (guns in the home.) Criminal Charges Pending?: No Does patient have a court date: No Prior Inpatient Therapy: Prior Inpatient Therapy: No Prior Therapy Dates: None Prior Therapy Facilty/Provider(s): None Reason for Treatment: None Prior Outpatient Therapy: Prior Outpatient Therapy: Yes Prior Therapy Dates: 1.5 years ago Prior Therapy Facilty/Provider(s): Cannot recall Reason for Treatment: counseling Does patient have an ACCT team?: No Does patient have Intensive In-House Services?  : No Does patient have [facility] services? : No Does patient have P4CC services?: No  Past Medical History:  Past Medical History  Diagnosis Date  . Hernia   .  IBS (irritable bowel  syndrome)   . Ulcer   . Thyroid disease   . PIH (pregnancy induced hypertension)   . Asthma   . Gastroparesis   . Pancreatitis   . Partial bowel obstruction ([place])   . GERD (gastroesophageal reflux disease)   . DDD (degenerative disc disease), cervical   . Recurrent oral ulcers     Past Surgical History  Procedure Laterality Date  . Gastric bypass    . Cholecystectomy    . Tubal ligation    . Endometrial ablation    . Hernia repair    . Back surgery    . Cesarean section     Family History: History reviewed. No pertinent family history. Family Psychiatric  History: Denies family history of mental illness Social History:  History  Alcohol Use No    Comment:       History  Drug Use No    Social History   Social History  . Marital Status: Divorced    Spouse Name: N/A  . Number of Children: N/A  . Years of Education: N/A   Social History Main Topics  . Smoking status: Never Smoker   . Smokeless tobacco: Never Used  . Alcohol Use: No     Comment:    . Drug Use: No  . Sexual Activity: Yes    Birth Control/ Protection: Surgical   Other Topics Concern  . None   Social History Narrative   Additional Social History:    Pain Medications: Tramadol 8m 2x/D Prescriptions: See PTA medication list Over the Counter: N/A History of alcohol / drug use?: No history of alcohol / drug abuse                     Allergies:   Allergies  Allergen Reactions  . Amoxicillin Rash    Has patient had a PCN reaction causing immediate rash, facial/tongue/throat swelling, SOB or lightheadedness with hypotension: unknown Has patient had a PCN reaction causing severe rash involving mucus membranes or skin necrosis: unknown Has patient had a PCN reaction that required hospitalization no Has patient had a PCN reaction occurring within the last 10 years: yes If all of the above answers are "NO", then may proceed with Cephalosporin use.   . Avelox [Moxifloxacin Hcl In Nacl]      Rash   . Doxycycline Other (See Comments)    Unknown reaction  . Morphine Hives and Itching  . Penicillins Other (See Comments)    Childhood allergy Has patient had a PCN reaction causing immediate rash, facial/tongue/throat swelling, SOB or lightheadedness with hypotension: unknown Has patient had a PCN reaction causing severe rash involving mucus membranes or skin necrosis: unknown Has patient had a PCN reaction that required hospitalization unknown Has patient had a PCN reaction occurring within the last 10 years: no If all of the above answers are "NO", then may proceed with Cephalosporin use.   .Hyman HopesAllergy] Nausea And Vomiting    Projectile vomiting, catfish too  . Moxifloxacin Swelling and Rash    Labs:  Results for orders placed or performed during the hospital encounter of 03/25/15 (from the past 48 hour(s))  I-Stat beta hCG blood, ED ([facility], [facility], [facility] only)     Status: None   Collection Time: 03/25/15  6:51 PM  Result Value Ref Range   I-stat hCG, quantitative <5.0 <5 mIU/mL   Comment 3            Comment:  GEST. AGE      CONC.  (mIU/mL)   <=1 WEEK        5 - 50     2 WEEKS       50 - 500     3 WEEKS       100 - 10,000     4 WEEKS     1,000 - 30,000        FEMALE AND NON-PREGNANT FEMALE:     LESS THAN 5 mIU/mL   Comprehensive metabolic panel     Status: Abnormal   Collection Time: 03/25/15  6:52 PM  Result Value Ref Range   Sodium 139 135 - 145 mmol/L   Potassium 4.1 3.5 - 5.1 mmol/L   Chloride 109 101 - 111 mmol/L   CO2 23 22 - 32 mmol/L   Glucose, Bld 102 (H) 65 - 99 mg/dL   BUN 18 6 - 20 mg/dL   Creatinine, Ser 0.71 0.44 - 1.00 mg/dL   Calcium 9.5 8.9 - 10.3 mg/dL   Total Protein 6.7 6.5 - 8.1 g/dL   Albumin 3.7 3.5 - 5.0 g/dL   AST 56 (H) 15 - 41 U/L   ALT 36 14 - 54 U/L   Alkaline Phosphatase 75 38 - 126 U/L   Total Bilirubin 0.4 0.3 - 1.2 mg/dL   GFR calc non Af Amer >60 >60 mL/min   GFR calc Af Amer >60 >60 mL/min    Comment: (NOTE) The  eGFR has been calculated using the CKD EPI equation. This calculation has not been validated in all clinical situations. eGFR's persistently <60 mL/min signify possible Chronic Kidney Disease.    Anion gap 7 5 - 15  CBC     Status: Abnormal   Collection Time: 03/25/15  6:52 PM  Result Value Ref Range   WBC 5.1 4.0 - 10.5 K/uL   RBC 4.08 3.87 - 5.11 MIL/uL   Hemoglobin 10.5 (L) 12.0 - 15.0 g/dL   HCT 32.8 (L) 36.0 - 46.0 %   MCV 80.4 78.0 - 100.0 fL   MCH 25.7 (L) 26.0 - 34.0 pg   MCHC 32.0 30.0 - 36.0 g/dL   RDW 16.1 (H) 11.5 - 15.5 %   Platelets 299 150 - 400 K/uL  Ethanol (ETOH)     Status: None   Collection Time: 03/25/15  6:53 PM  Result Value Ref Range   Alcohol, Ethyl (B) <5 <5 mg/dL    Comment:        LOWEST DETECTABLE LIMIT FOR SERUM ALCOHOL IS 5 mg/dL FOR MEDICAL PURPOSES ONLY   Salicylate level     Status: None   Collection Time: 03/25/15  6:53 PM  Result Value Ref Range   Salicylate Lvl <2.8 2.8 - 30.0 mg/dL  Acetaminophen level     Status: Abnormal   Collection Time: 03/25/15  6:53 PM  Result Value Ref Range   Acetaminophen (Tylenol), Serum <10 (L) 10 - 30 ug/mL    Comment:        THERAPEUTIC CONCENTRATIONS VARY SIGNIFICANTLY. A RANGE OF 10-30 ug/mL MAY BE AN EFFECTIVE CONCENTRATION FOR MANY PATIENTS. HOWEVER, SOME ARE BEST TREATED AT CONCENTRATIONS OUTSIDE THIS RANGE. ACETAMINOPHEN CONCENTRATIONS >150 ug/mL AT 4 HOURS AFTER INGESTION AND >50 ug/mL AT 12 HOURS AFTER INGESTION ARE OFTEN ASSOCIATED WITH TOXIC REACTIONS.   Urine rapid drug screen (hosp performed) (Not at [facility])     Status: Abnormal   Collection Time: 03/25/15  6:57 PM  Result Value Ref Range  Opiates POSITIVE (A) NONE DETECTED   Cocaine NONE DETECTED NONE DETECTED   Benzodiazepines POSITIVE (A) NONE DETECTED   Amphetamines POSITIVE (A) NONE DETECTED   Tetrahydrocannabinol NONE DETECTED NONE DETECTED   Barbiturates NONE DETECTED NONE DETECTED    Comment:        DRUG SCREEN FOR  MEDICAL PURPOSES ONLY.  IF CONFIRMATION IS NEEDED FOR ANY PURPOSE, NOTIFY LAB WITHIN 5 DAYS.        LOWEST DETECTABLE LIMITS FOR URINE DRUG SCREEN Drug Class       Cutoff (ng/mL) Amphetamine      1000 Barbiturate      200 Benzodiazepine   093 Tricyclics       235 Opiates          300 Cocaine          300 THC              50     Current Facility-Administered Medications  Medication Dose Route Frequency Provider Last Rate Last Dose  . albuterol (PROVENTIL HFA;VENTOLIN HFA) 108 (90 BASE) MCG/ACT inhaler 1-2 puff  1-2 puff Inhalation Q6H PRN Dorie Rank, MD      . buPROPion (WELLBUTRIN XL) 24 hr tablet 150 mg  150 mg Oral Daily Margaretta Chittum      . clonazePAM (KLONOPIN) tablet 2 mg  2 mg Oral QHS Dorie Rank, MD   2 mg at 03/25/15 2349  . docusate sodium (COLACE) capsule 100 mg  100 mg Oral TID PRN Dorie Rank, MD      . ferrous sulfate tablet 325 mg  325 mg Oral Q breakfast Dorie Rank, MD   325 mg at 03/26/15 0803  . fluticasone (FLONASE) 50 MCG/ACT nasal spray 2 spray  2 spray Each Nare BID Dorie Rank, MD   2 spray at 03/26/15 0919  . levothyroxine (SYNTHROID, LEVOTHROID) tablet 50 mcg  50 mcg Oral QAC breakfast Dorie Rank, MD   50 mcg at 03/26/15 0803  . Linaclotide (LINZESS) capsule 145 mcg  145 mcg Oral Daily Dorie Rank, MD   145 mcg at 03/26/15 [phone]  . ondansetron (ZOFRAN) tablet 4 mg  4 mg Oral Q8H PRN Dorie Rank, MD      . pantoprazole (PROTONIX) EC tablet 40 mg  40 mg Oral Daily Dorie Rank, MD   40 mg at 03/26/15 0919  . polyethylene glycol (MIRALAX / GLYCOLAX) packet 17 g  17 g Oral BID PRN Dorie Rank, MD      . traMADol Veatrice Bourbon) tablet 50 mg  50 mg Oral Q12H PRN Dorie Rank, MD   50 mg at 03/25/15 2349   Current Outpatient Prescriptions  Medication Sig Dispense Refill  . albuterol (PROVENTIL HFA;VENTOLIN HFA) 108 (90 BASE) MCG/ACT inhaler Inhale 1-2 puffs into the lungs every 6 (six) hours as needed for wheezing or shortness of breath.    Marland Kitchen albuterol (PROVENTIL) (2.5 MG/3ML) 0.083%  nebulizer solution Take 2.5 mg by nebulization every 6 (six) hours as needed for wheezing or shortness of breath.    . amphetamine-dextroamphetamine (ADDERALL) 20 MG tablet Take 20 mg by mouth 3 (three) times daily.    . clonazePAM (KLONOPIN) 1 MG tablet Take 2 mg by mouth at bedtime.     . Cyanocobalamin (B-12 PO) Take 1 tablet by mouth daily.    . cyclobenzaprine (FLEXERIL) 10 MG tablet Take 1 tablet (10 mg total) by mouth 2 (two) times daily as needed for muscle spasms. 20 tablet 0  . dicyclomine (BENTYL) 10 MG  capsule Take 10 mg by mouth daily.    Marland Kitchen docusate sodium (COLACE) 100 MG capsule Take 100 mg by mouth 3 (three) times daily as needed for mild constipation.    . ferrous sulfate dried (SLOW FE) 160 (50 FE) MG TBCR Take 160 mg by mouth 3 (three) times daily with meals.     Marland Kitchen FLUoxetine (PROZAC) 10 MG tablet Take 10 mg by mouth daily.    . fluticasone (FLONASE) 50 MCG/ACT nasal spray Place 2 sprays into both nostrils 2 (two) times daily.    Marland Kitchen gabapentin (NEURONTIN) 300 MG capsule Take 900 mg by mouth at bedtime as needed (depending on how tired she is).     Marland Kitchen levothyroxine (SYNTHROID, LEVOTHROID) 50 MCG tablet Take 50 mcg by mouth daily before breakfast.    . Linaclotide (LINZESS) 145 MCG CAPS capsule Take 145 mcg by mouth daily.    . multivitamin (THERAGRAN) per tablet Take 3 tablets by mouth daily.     . mupirocin cream (BACTROBAN) 2 % Apply 1 application topically 2 (two) times daily. (Patient taking differently: Apply 1 application topically 2 (two) times daily as needed (flare up). ) 15 g 0  . omeprazole (PRILOSEC) 20 MG capsule Take 20 mg by mouth daily.    . ondansetron (ZOFRAN) 4 MG tablet Take 1 tablet (4 mg total) by mouth every 8 (eight) hours as needed for nausea or vomiting. 12 tablet 0  . pantoprazole (PROTONIX) 40 MG tablet Take 40 mg by mouth daily.    . polyethylene glycol powder (GLYCOLAX/MIRALAX) powder Take 17 g by mouth daily. Until daily soft stools  OTC (Patient  taking differently: Take 17 g by mouth 2 (two) times daily as needed for moderate constipation. ) 119 g 0  . promethazine (PHENERGAN) 25 MG tablet Take 25 mg by mouth every 6 (six) hours as needed for nausea or vomiting.    . sodium phosphate (FLEET) 7-19 GM/118ML ENEM Place 133 mLs (1 enema total) rectally once. (Patient taking differently: Place 1 enema rectally daily as needed for moderate constipation. ) 1 Bottle 0  . traMADol (ULTRAM) 50 MG tablet Take 50 mg by mouth every 12 (twelve) hours as needed for moderate pain.    Marland Kitchen HYDROcodone-acetaminophen (NORCO/VICODIN) 5-325 MG per tablet Take 1 tablet by mouth every 6 (six) hours as needed. (Patient not taking: Reported on 10/22/2014) 5 tablet 0  . ondansetron (ZOFRAN ODT) 4 MG disintegrating tablet Take 1 tablet (4 mg total) by mouth every 8 (eight) hours as needed for nausea or vomiting. (Patient not taking: Reported on 10/22/2014) 20 tablet 0  . oxyCODONE-acetaminophen (PERCOCET/ROXICET) 5-325 MG per tablet Take 2 tablets by mouth every 4 (four) hours as needed for severe pain. (Patient not taking: Reported on 02/19/2015) 20 tablet 0  . predniSONE (DELTASONE) 20 MG tablet Take 2 tablets (40 mg total) by mouth daily. (Patient not taking: Reported on 01/22/2015) 10 tablet 0  . [DISCONTINUED] citalopram (CELEXA) 20 MG tablet Take 20 mg by mouth daily.      . [DISCONTINUED] metFORMIN (GLUCOPHAGE) 500 MG tablet Take 500 mg by mouth 2 (two) times daily with a meal.        Musculoskeletal: Strength & Muscle Tone: within normal limits Gait & Station: normal Patient leans: N/A  Psychiatric Specialty Exam: Review of Systems  Gastrointestinal: Positive for abdominal pain (Chronic).  Musculoskeletal: Positive for back pain.  Psychiatric/Behavioral: Negative for suicidal ideas, memory loss and substance abuse. Depression: Stable. Hallucinations: denies. The patient does not  have insomnia. Nervous/anxious: Stable.   All other systems reviewed and are  negative.   Blood pressure 124/71, pulse 74, temperature 98 F (36.7 C), temperature source Oral, resp. rate 20, SpO2 99 %.There is no weight on file to calculate BMI.  General Appearance: Casual  Eye Contact::  Good  Speech:  Clear and Coherent and Normal Rate  Volume:  Normal  Mood:  Anxious  Affect:  Congruent  Thought Process:  Circumstantial and Goal Directed  Orientation:  Full (Time, Place, and Person)  Thought Content:  Denies hallucination, delusions, and paranoia  Suicidal Thoughts:  No  Homicidal Thoughts:  No  Memory:  Immediate;   Good Recent;   Good Remote;   Good  Judgement:  Intact  Insight:  Present  Psychomotor Activity:  Normal  Concentration:  Fair  Recall:  Good  Fund of Knowledge:Good  Language: Good  Akathisia:  No  Handed:  Right  AIMS (if indicated):     Assets:  Communication Skills Desire for Improvement Housing Social Support  ADL's:  Intact  Cognition: WNL  Sleep:      Treatment Plan Summary: Medication management and Plan Discharge home follow up with primary outpatient psych provider  Disposition: No evidence of imminent risk to self or others at present.   Patient does not meet criteria for psychiatric inpatient admission. Supportive therapy provided about ongoing stressors. Discussed crisis plan, support from social network, calling 911, coming to the Emergency Department, and calling Suicide Hotline. Discharge home.  Patient to follow up with [facility] NP) December 9, at 1:00 PM  Earleen Newport, [facility] 03/26/2015 2:36 PM Patient seen face-to-face for psychiatric evaluation, chart reviewed and case discussed with the physician extender and developed treatment plan. Reviewed the information documented and agree with the treatment plan. Corena Pilgrim, MD

## 2015-03-26 NOTE — BH Assessment (Signed)
[facility] Assessment Progress Note  Per Corena Pilgrim, MD, this pt does not require psychiatric hospitalization at this time.  She is to be discharged from [facility] with recommendation to continue treatment with Stephannie Peters, NP.  Her next appointment has been scheduled for Friday, 05/15/2015 at 13:00.  This has been included in pt's discharge instructions, long with the office's new location.  Pt's nurse, Dawnaly, has been notified.  Jalene Mullet, [place] Triage Specialist [phone]

## 2015-03-26 NOTE — BHH Suicide Risk Assessment (Cosign Needed)
Suicide Risk Assessment  Discharge Assessment   [facility] Discharge Suicide Risk Assessment   Demographic Factors:  Caucasian  Total Time spent with patient: 20 minutes  Musculoskeletal: Strength & Muscle Tone: within normal limits Gait & Station: normal Patient leans: N/A  Psychiatric Specialty Exam:  See Consult Note     Blood pressure 124/71, pulse 74, temperature 98 F (36.7 C), temperature source Oral, resp. rate 20, SpO2 99 %.There is no weight on file to calculate BMI.  Has this patient used any form of tobacco in the last 30 days? (Cigarettes, Smokeless Tobacco, Cigars, and/or Pipes) No  Mental Status Per Nursing Assessment::   On Admission:     Current Mental Status by Physician: Denies suicidal/homicidal ideation, hallucinations, delusions, and paranoia  Loss Factors: Decline in physical health  Historical Factors: NA  Risk Reduction Factors:   Sense of responsibility to family, Positive social support and Positive therapeutic relationship  Continued Clinical Symptoms:  Previous Psychiatric Diagnoses and Treatments  Cognitive Features That Contribute To Risk:  Polarized thinking    Suicide Risk:  Minimal: No identifiable suicidal ideation.  Patients presenting with no risk factors but with morbid ruminations; may be classified as minimal risk based on the severity of the depressive symptoms  Principal Problem: Medication side effect Discharge Diagnoses:  Patient Active Problem List   Diagnosis Date Noted  . Medication side effect [T88.7XXA] 03/26/2015  . PREGNANCY-INDUCED HYPERTENSION [IMO0002] 06/28/2010  . GESTATIONAL DIABETES [O99.810] 06/28/2010  . DYSPNEA ON EXERTION [R06.09, R09.89] 06/28/2010  . OBESITY [E66.9] 09/14/2009  . CONSTIPATION [K59.00] 09/14/2009  . NAUSEA AND VOMITING [R11.2] 09/14/2009  . NAUSEA ALONE [R11.0] 09/14/2009  . FLATULENCE [R14.3, R14.1, R14.2] 09/14/2009  . ABDOMINAL PAIN, GENERALIZED [R10.84] 09/14/2009  . INTESTINAL  MALABSORPTION, POSTSURGICAL [K91.2] 06/10/2008  . SKIN LESIONS, MULTIPLE [L98.9] 06/10/2008  . PRIOR LAPAROSCOPIC ROUX-EN-BARIATRIC SURGERY [Z98.84] 03/17/2008  . ANEMIA-UNSPECIFIED [D64.9] 02/13/2008  . IRRITABLE BOWEL SYNDROME [K58.9] 02/13/2008  . ABDOMINAL PAIN-MULTIPLE SITES [R10.9] 02/13/2008      Plan Of Care/Follow-up recommendations:  Activity:  As tolerated Diet:  As tolerated Other:  Follow up with primary psych provider [place]  Is patient on multiple antipsychotic therapies at discharge:  No   Has Patient had three or more failed trials of antipsychotic monotherapy by history:  No  Recommended Plan for Multiple Antipsychotic Therapies: NA    Rankin, Shuvon, FNP-BC 03/26/2015, 3:09 PM

## 2015-03-26 NOTE — Discharge Instructions (Signed)
For your ongoing behavioral health needs, you are advised to continue treatment with Stephannie Peters, NP at the [facility].  Your next appointment is scheduled for Friday, 05/15/2015 at 1:00 pm.  Please note that the [facility] has relocated:       [facility]      [address], [place], [place] [address]      [phone]

## 2015-04-05 ENCOUNTER — Ambulatory Visit (HOSPITAL_COMMUNITY)
Admission: AD | Admit: 2015-04-05 | Discharge: 2015-04-05 | Disposition: A | Discharge status: Home / Self Care | Payer: Medicaid Other | Attending: Psychiatry | Admitting: Psychiatry

## 2015-04-05 ENCOUNTER — Encounter (HOSPITAL_COMMUNITY): Payer: Self-pay | Admitting: Family Medicine

## 2015-04-05 ENCOUNTER — Emergency Department (HOSPITAL_COMMUNITY)
Admission: EM | Admit: 2015-04-05 | Discharge: 2015-04-06 | Disposition: A | Discharge status: Other Acute Inpatient Hospital | Payer: Medicaid Other | Attending: Emergency Medicine | Admitting: Emergency Medicine

## 2015-04-05 DIAGNOSIS — Z8739 Personal history of other diseases of the musculoskeletal system and connective tissue: Secondary | ICD-10-CM | POA: Insufficient documentation

## 2015-04-05 DIAGNOSIS — K219 Gastro-esophageal reflux disease without esophagitis: Secondary | ICD-10-CM | POA: Insufficient documentation

## 2015-04-05 DIAGNOSIS — Z792 Long term (current) use of antibiotics: Secondary | ICD-10-CM | POA: Diagnosis not present

## 2015-04-05 DIAGNOSIS — F322 Major depressive disorder, single episode, severe without psychotic features: Secondary | ICD-10-CM | POA: Diagnosis present

## 2015-04-05 DIAGNOSIS — Z872 Personal history of diseases of the skin and subcutaneous tissue: Secondary | ICD-10-CM | POA: Insufficient documentation

## 2015-04-05 DIAGNOSIS — J45909 Unspecified asthma, uncomplicated: Secondary | ICD-10-CM | POA: Insufficient documentation

## 2015-04-05 DIAGNOSIS — R1010 Upper abdominal pain, unspecified: Secondary | ICD-10-CM | POA: Diagnosis not present

## 2015-04-05 DIAGNOSIS — F332 Major depressive disorder, recurrent severe without psychotic features: Secondary | ICD-10-CM | POA: Insufficient documentation

## 2015-04-05 DIAGNOSIS — Z79899 Other long term (current) drug therapy: Secondary | ICD-10-CM | POA: Diagnosis not present

## 2015-04-05 DIAGNOSIS — Z7951 Long term (current) use of inhaled steroids: Secondary | ICD-10-CM | POA: Insufficient documentation

## 2015-04-05 DIAGNOSIS — Z880 Allergy status to penicillin: Secondary | ICD-10-CM | POA: Insufficient documentation

## 2015-04-05 DIAGNOSIS — F1310 Sedative, hypnotic or anxiolytic abuse, uncomplicated: Secondary | ICD-10-CM | POA: Diagnosis not present

## 2015-04-05 DIAGNOSIS — E079 Disorder of thyroid, unspecified: Secondary | ICD-10-CM | POA: Diagnosis not present

## 2015-04-05 DIAGNOSIS — Z008 Encounter for other general examination: Secondary | ICD-10-CM | POA: Diagnosis present

## 2015-04-05 DIAGNOSIS — D649 Anemia, unspecified: Secondary | ICD-10-CM | POA: Diagnosis not present

## 2015-04-05 DIAGNOSIS — G8929 Other chronic pain: Secondary | ICD-10-CM | POA: Insufficient documentation

## 2015-04-05 DIAGNOSIS — R45851 Suicidal ideations: Secondary | ICD-10-CM | POA: Diagnosis not present

## 2015-04-05 HISTORY — DX: Other intervertebral disc displacement, lumbar region: M51.26

## 2015-04-05 LAB — COMPREHENSIVE METABOLIC PANEL
ALT: 16 U/L (ref 14–54)
AST: 19 U/L (ref 15–41)
Albumin: 3.5 g/dL (ref 3.5–5.0)
Alkaline Phosphatase: 68 U/L (ref 38–126)
Anion gap: 5 (ref 5–15)
BUN: 12 mg/dL (ref 6–20)
CHLORIDE: 106 mmol/L (ref 101–111)
CO2: 28 mmol/L (ref 22–32)
Calcium: 9.2 mg/dL (ref 8.9–10.3)
Creatinine, Ser: 0.57 mg/dL (ref 0.44–1.00)
Glucose, Bld: 94 mg/dL (ref 65–99)
POTASSIUM: 4.4 mmol/L (ref 3.5–5.1)
SODIUM: 139 mmol/L (ref 135–145)
Total Bilirubin: <0.1 mg/dL — ABNORMAL LOW (ref 0.3–1.2)
Total Protein: 6.5 g/dL (ref 6.5–8.1)

## 2015-04-05 LAB — CBC WITH DIFFERENTIAL/PLATELET
BASOS ABS: 0.1 10*3/uL (ref 0.0–0.1)
Basophils Relative: 1 %
Eosinophils Absolute: 0.2 10*3/uL (ref 0.0–0.7)
Eosinophils Relative: 4 %
HEMATOCRIT: 34 % — AB (ref 36.0–46.0)
HEMOGLOBIN: 10.7 g/dL — AB (ref 12.0–15.0)
LYMPHS PCT: 44 %
Lymphs Abs: 2.4 10*3/uL (ref 0.7–4.0)
MCH: 25.8 pg — ABNORMAL LOW (ref 26.0–34.0)
MCHC: 31.5 g/dL (ref 30.0–36.0)
MCV: 81.9 fL (ref 78.0–100.0)
Monocytes Absolute: 0.5 10*3/uL (ref 0.1–1.0)
Monocytes Relative: 9 %
NEUTROS ABS: 2.3 10*3/uL (ref 1.7–7.7)
NEUTROS PCT: 42 %
PLATELETS: 293 10*3/uL (ref 150–400)
RBC: 4.15 MIL/uL (ref 3.87–5.11)
RDW: 16.6 % — ABNORMAL HIGH (ref 11.5–15.5)
WBC: 5.4 10*3/uL (ref 4.0–10.5)

## 2015-04-05 LAB — RAPID URINE DRUG SCREEN, HOSP PERFORMED
AMPHETAMINES: NOT DETECTED
BARBITURATES: NOT DETECTED
BENZODIAZEPINES: POSITIVE — AB
Cocaine: NOT DETECTED
Opiates: NOT DETECTED
TETRAHYDROCANNABINOL: NOT DETECTED

## 2015-04-05 LAB — ETHANOL

## 2015-04-05 MED ORDER — CLONAZEPAM 1 MG PO TABS
2.0000 mg | ORAL_TABLET | Freq: Every day | ORAL | Status: DC
  Administered 2015-04-05: 2 mg via ORAL
  Filled 2015-04-05: qty 4

## 2015-04-05 MED ORDER — TRAMADOL HCL 50 MG PO TABS
50.0000 mg | ORAL_TABLET | Freq: Once | ORAL | Status: AC
  Administered 2015-04-06: 50 mg via ORAL
  Filled 2015-04-05: qty 1

## 2015-04-05 MED ORDER — ZOLPIDEM TARTRATE 5 MG PO TABS: 5.0000 mg | ORAL_TABLET | Freq: Every evening | ORAL | Status: DC | PRN

## 2015-04-05 MED ORDER — ADULT MULTIVITAMIN W/MINERALS CH
1.0000 | ORAL_TABLET | Freq: Every day | ORAL | Status: DC
  Administered 2015-04-06: 1 via ORAL
  Filled 2015-04-05: qty 1

## 2015-04-05 MED ORDER — ONDANSETRON HCL 4 MG PO TABS
4.0000 mg | ORAL_TABLET | Freq: Three times a day (TID) | ORAL | Status: DC | PRN
  Administered 2015-04-06 (×2): 4 mg via ORAL
  Filled 2015-04-05 (×2): qty 1

## 2015-04-05 MED ORDER — LINACLOTIDE 145 MCG PO CAPS
145.0000 ug | ORAL_CAPSULE | Freq: Every day | ORAL | Status: DC | PRN
  Filled 2015-04-05: qty 1

## 2015-04-05 MED ORDER — FLUTICASONE PROPIONATE 50 MCG/ACT NA SUSP
2.0000 | Freq: Two times a day (BID) | NASAL | Status: DC
  Administered 2015-04-06: 2 via NASAL
  Filled 2015-04-05 (×2): qty 16

## 2015-04-05 MED ORDER — ALBUTEROL SULFATE HFA 108 (90 BASE) MCG/ACT IN AERS: 1.0000 | INHALATION_SPRAY | Freq: Four times a day (QID) | RESPIRATORY_TRACT | Status: DC | PRN

## 2015-04-05 MED ORDER — DOCUSATE SODIUM 100 MG PO CAPS
100.0000 mg | ORAL_CAPSULE | Freq: Three times a day (TID) | ORAL | Status: DC | PRN
  Filled 2015-04-05: qty 1

## 2015-04-05 MED ORDER — LORAZEPAM 1 MG PO TABS: 1.0000 mg | ORAL_TABLET | Freq: Three times a day (TID) | ORAL | Status: DC | PRN

## 2015-04-05 MED ORDER — CYCLOBENZAPRINE HCL 10 MG PO TABS
5.0000 mg | ORAL_TABLET | Freq: Once | ORAL | Status: AC
  Administered 2015-04-05: 5 mg via ORAL
  Filled 2015-04-05: qty 1

## 2015-04-05 MED ORDER — BUPROPION HCL ER (XL) 150 MG PO TB24
150.0000 mg | ORAL_TABLET | Freq: Every day | ORAL | Status: DC
  Administered 2015-04-06: 150 mg via ORAL
  Filled 2015-04-05: qty 1

## 2015-04-05 MED ORDER — LEVOTHYROXINE SODIUM 50 MCG PO TABS
50.0000 ug | ORAL_TABLET | Freq: Every day | ORAL | Status: DC
  Administered 2015-04-06: 50 ug via ORAL
  Filled 2015-04-05 (×2): qty 1

## 2015-04-05 NOTE — ED Notes (Addendum)
Pt belonging: shirt, pants, bra and shoes

## 2015-04-05 NOTE — BHH Counselor (Signed)
Disposition: Per Arlester Marker, NP, Pt meets inpt criteria. Sent to [facility] for med clearance. No appropriate ([address]) [place] beds currently. TTS to seek placement.   Pt sent to [facility] for medical clearance. Agricultural consultant at [employer] was notified.   Ramond Dial, [facility] Triage Specialist
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
## 2015-04-05 NOTE — ED Provider Notes (Signed)
CSN: [id number]     Arrival date & time 04/05/15  2116 History   First MD Initiated Contact with Patient 04/05/15 2140     No chief complaint on file.    (Consider location/radiation/quality/duration/timing/severity/associated sxs/prior Treatment) HPI 44 year old female status post gastric bypass surgery presents today from [facility] with report she is scheduled for admission there but needs medical clearance. She states that she has her usual chronic pain but denies any new type pain. She states that she does have some upper abdominal discomfort. She states that she was diagnosed with serotonin syndrome and that she has been very irritable since that time. Past Medical History  Diagnosis Date  . Hernia   . IBS (irritable bowel syndrome)   . Ulcer   . Thyroid disease   . PIH (pregnancy induced hypertension)   . Asthma   . Gastroparesis   . Pancreatitis   . Partial bowel obstruction ([place])   . GERD (gastroesophageal reflux disease)   . DDD (degenerative disc disease), cervical   . Recurrent oral ulcers    Past Surgical History  Procedure Laterality Date  . Gastric bypass    . Cholecystectomy    . Tubal ligation    . Endometrial ablation    . Hernia repair    . Back surgery    . Cesarean section     No family history on file. Social History  Substance Use Topics  . Smoking status: Never Smoker   . Smokeless tobacco: Never Used  . Alcohol Use: No     Comment:     OB History    No data available     Review of Systems  All other systems reviewed and are negative.     Allergies  Amoxicillin; Avelox; Doxycycline; Morphine; Penicillins; Salmon; and Moxifloxacin  Home Medications   Prior to Admission medications   Medication Sig Start Date End Date Taking? Authorizing Provider  albuterol (PROVENTIL HFA;VENTOLIN HFA) 108 (90 BASE) MCG/ACT inhaler Inhale 1-2 puffs into the lungs every 6 (six) hours as needed for wheezing or shortness of breath.   Yes  Historical Provider, MD  amphetamine-dextroamphetamine (ADDERALL) 20 MG tablet Take 20 mg by mouth 3 (three) times daily.   Yes Historical Provider, MD  buPROPion (WELLBUTRIN XL) 150 MG 24 hr tablet Take 1 tablet (150 mg total) by mouth daily. 03/26/15  Yes Shuvon B Rankin, NP  clonazePAM (KLONOPIN) 1 MG tablet Take 2 mg by mouth at bedtime.    Yes Historical Provider, MD  Cyanocobalamin (B-12 PO) Take 1 tablet by mouth daily.   Yes Historical Provider, MD  cyclobenzaprine (FLEXERIL) 10 MG tablet Take 1 tablet (10 mg total) by mouth 2 (two) times daily as needed for muscle spasms. 10/22/14  Yes Kaitlyn Szekalski, PA-C  dicyclomine (BENTYL) 10 MG capsule Take 10 mg by mouth daily.   Yes Historical Provider, MD  docusate sodium (COLACE) 100 MG capsule Take 100 mg by mouth 3 (three) times daily as needed for mild constipation.   Yes Historical Provider, MD  ferrous sulfate dried (SLOW FE) 160 (50 FE) MG TBCR Take 160 mg by mouth 3 (three) times daily with meals.    Yes Historical Provider, MD  fluticasone (FLONASE) 50 MCG/ACT nasal spray Place 2 sprays into both nostrils 2 (two) times daily.   Yes Historical Provider, MD  gabapentin (NEURONTIN) 300 MG capsule Take 900 mg by mouth at bedtime as needed (depending on how tired she is).    Yes Historical Provider, MD  levothyroxine (SYNTHROID, LEVOTHROID) 50 MCG tablet Take 50 mcg by mouth daily before breakfast.   Yes Historical Provider, MD  Linaclotide (LINZESS) 145 MCG CAPS capsule Take 145 mcg by mouth daily as needed (severe constipation).    Yes Historical Provider, MD  multivitamin [facility]) per tablet Take 3 tablets by mouth daily.    Yes Historical Provider, MD  mupirocin cream (BACTROBAN) 2 % Apply 1 application topically 2 (two) times daily. Patient taking differently: Apply 1 application topically 2 (two) times daily as needed (flare up).  01/26/14  Yes Hollace Kinnier Sofia, PA-C  omeprazole (PRILOSEC) 20 MG capsule Take 20 mg by mouth daily.   Yes  Historical Provider, MD  ondansetron (ZOFRAN) 4 MG tablet Take 1 tablet (4 mg total) by mouth every 8 (eight) hours as needed for nausea or vomiting. 02/19/15  Yes Forde Dandy, MD  pantoprazole (PROTONIX) 40 MG tablet Take 40 mg by mouth daily.   Yes Historical Provider, MD  polyethylene glycol powder (GLYCOLAX/MIRALAX) powder Take 17 g by mouth daily. Until daily soft stools  OTC Patient taking differently: Take 17 g by mouth 2 (two) times daily as needed for moderate constipation.  04/02/14  Yes Britt Bottom, NP  promethazine (PHENERGAN) 25 MG tablet Take 25 mg by mouth every 6 (six) hours as needed for nausea or vomiting.   Yes Historical Provider, MD  traMADol (ULTRAM) 50 MG tablet Take 50 mg by mouth every 12 (twelve) hours as needed for moderate pain.   Yes Historical Provider, MD   BP 112/83 mmHg  Pulse 76  Temp(Src) 98.1 F (36.7 C) (Oral)  Resp 16  SpO2 99% Physical Exam  Constitutional: She is oriented to person, place, and time. She appears well-developed and well-nourished.  HENT:  Head: Normocephalic and atraumatic.  Right Ear: External ear normal.  Left Ear: External ear normal.  Nose: Nose normal.  Mouth/Throat: Oropharynx is clear and moist.  Eyes: Conjunctivae and EOM are normal. Pupils are equal, round, and reactive to light.  Neck: Normal range of motion. Neck supple.  Cardiovascular: Normal rate, regular rhythm, normal heart sounds and intact distal pulses.   Pulmonary/Chest: Effort normal and breath sounds normal.  Abdominal: Soft. Bowel sounds are normal.  Musculoskeletal: Normal range of motion.  Neurological: She is alert and oriented to person, place, and time. She has normal reflexes.  Skin: Skin is warm and dry.  Psychiatric: She has a normal mood and affect. Her behavior is normal. Judgment and thought content normal.  Nursing note and vitals reviewed.   ED Course  Procedures (including critical care time) Labs Review Labs Reviewed   COMPREHENSIVE METABOLIC PANEL - Abnormal; Notable for the following:    Total Bilirubin <0.1 (*)    All other components within normal limits  CBC WITH DIFFERENTIAL/PLATELET - Abnormal; Notable for the following:    Hemoglobin 10.7 (*)    HCT 34.0 (*)    MCH 25.8 (*)    RDW 16.6 (*)    All other components within normal limits  URINE RAPID DRUG SCREEN, HOSP PERFORMED - Abnormal; Notable for the following:    Benzodiazepines POSITIVE (*)    All other components within normal limits  ETHANOL    Imaging Review No results found. I have personally reviewed and evaluated these images and lab results as part of my medical decision-making.   EKG Interpretation   Date/Time:  Sunday April 05 2015 21:59:40 EDT Ventricular Rate:  84 PR Interval:  151 QRS Duration: 100 QT  Interval:  361 QTC Calculation: 427 R Axis:   88 Text Interpretation:  Normal sinus rhythm No significant change since last  tracing Confirmed by Tamario Heal MD, Andee Poles [phone]) on 04/05/2015 10:38:47 PM      MDM   1-depression- [facility], patient states voluntary, and patient denies suicidal ideation or attempt here. 2- anemia- stable 3 chronic pain patient is requesting Flexeril and tramadol.  Pattricia Boss, MD 04/05/15 [phone]
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
## 2015-04-05 NOTE — ED Notes (Signed)
Pt states she had a serotonin reaction last week. On Sunday, she started feeling anxious, aggressive, violent, and suicidal thoughts. Pt denies having a plan but states " I know how to do it".

## 2015-04-06 ENCOUNTER — Encounter (HOSPITAL_COMMUNITY): Payer: Self-pay | Admitting: Behavioral Health

## 2015-04-06 ENCOUNTER — Inpatient Hospital Stay (HOSPITAL_COMMUNITY)
Admission: AD | Admit: 2015-04-06 | Discharge: 2015-04-12 | DRG: 885 | Disposition: A | Discharge status: Home / Self Care | Payer: Medicaid Other | Source: Intra-hospital | Attending: Psychiatry | Admitting: Psychiatry

## 2015-04-06 DIAGNOSIS — G8929 Other chronic pain: Secondary | ICD-10-CM | POA: Diagnosis present

## 2015-04-06 DIAGNOSIS — E079 Disorder of thyroid, unspecified: Secondary | ICD-10-CM | POA: Diagnosis not present

## 2015-04-06 DIAGNOSIS — Z7951 Long term (current) use of inhaled steroids: Secondary | ICD-10-CM | POA: Diagnosis not present

## 2015-04-06 DIAGNOSIS — Z760 Encounter for issue of repeat prescription: Secondary | ICD-10-CM | POA: Diagnosis not present

## 2015-04-06 DIAGNOSIS — Z9884 Bariatric surgery status: Secondary | ICD-10-CM | POA: Diagnosis not present

## 2015-04-06 DIAGNOSIS — F332 Major depressive disorder, recurrent severe without psychotic features: Secondary | ICD-10-CM | POA: Diagnosis present

## 2015-04-06 DIAGNOSIS — F322 Major depressive disorder, single episode, severe without psychotic features: Secondary | ICD-10-CM | POA: Diagnosis not present

## 2015-04-06 DIAGNOSIS — Z880 Allergy status to penicillin: Secondary | ICD-10-CM | POA: Diagnosis not present

## 2015-04-06 DIAGNOSIS — Z8739 Personal history of other diseases of the musculoskeletal system and connective tissue: Secondary | ICD-10-CM | POA: Diagnosis not present

## 2015-04-06 DIAGNOSIS — Z008 Encounter for other general examination: Secondary | ICD-10-CM | POA: Diagnosis present

## 2015-04-06 DIAGNOSIS — R45851 Suicidal ideations: Secondary | ICD-10-CM | POA: Diagnosis present

## 2015-04-06 DIAGNOSIS — F4321 Adjustment disorder with depressed mood: Secondary | ICD-10-CM | POA: Diagnosis not present

## 2015-04-06 DIAGNOSIS — F339 Major depressive disorder, recurrent, unspecified: Secondary | ICD-10-CM | POA: Diagnosis present

## 2015-04-06 DIAGNOSIS — F419 Anxiety disorder, unspecified: Secondary | ICD-10-CM | POA: Diagnosis not present

## 2015-04-06 DIAGNOSIS — F1310 Sedative, hypnotic or anxiolytic abuse, uncomplicated: Secondary | ICD-10-CM | POA: Diagnosis not present

## 2015-04-06 DIAGNOSIS — K219 Gastro-esophageal reflux disease without esophagitis: Secondary | ICD-10-CM | POA: Diagnosis not present

## 2015-04-06 DIAGNOSIS — Z79899 Other long term (current) drug therapy: Secondary | ICD-10-CM

## 2015-04-06 DIAGNOSIS — J45909 Unspecified asthma, uncomplicated: Secondary | ICD-10-CM | POA: Diagnosis not present

## 2015-04-06 MED ORDER — LINACLOTIDE 145 MCG PO CAPS
145.0000 ug | ORAL_CAPSULE | Freq: Every day | ORAL | Status: DC | PRN
  Administered 2015-04-09: 145 ug via ORAL
  Filled 2015-04-06 (×2): qty 1

## 2015-04-06 MED ORDER — DULOXETINE HCL 20 MG PO CPEP
20.0000 mg | ORAL_CAPSULE | Freq: Every day | ORAL | Status: DC
  Administered 2015-04-07 – 2015-04-08 (×2): 20 mg via ORAL
  Filled 2015-04-06 (×5): qty 1

## 2015-04-06 MED ORDER — LORATADINE 10 MG PO TABS
10.0000 mg | ORAL_TABLET | Freq: Every day | ORAL | Status: DC
  Administered 2015-04-07 – 2015-04-12 (×6): 10 mg via ORAL
  Filled 2015-04-06 (×9): qty 1

## 2015-04-06 MED ORDER — ALBUTEROL SULFATE (2.5 MG/3ML) 0.083% IN NEBU: 3.0000 mL | INHALATION_SOLUTION | Freq: Four times a day (QID) | RESPIRATORY_TRACT | Status: DC | PRN

## 2015-04-06 MED ORDER — PANTOPRAZOLE SODIUM 20 MG PO TBEC
20.0000 mg | DELAYED_RELEASE_TABLET | Freq: Every day | ORAL | Status: DC
  Administered 2015-04-07 – 2015-04-12 (×6): 20 mg via ORAL
  Filled 2015-04-06 (×10): qty 1

## 2015-04-06 MED ORDER — ALUM & MAG HYDROXIDE-SIMETH 200-200-20 MG/5ML PO SUSP: 30.0000 mL | ORAL | Status: DC | PRN

## 2015-04-06 MED ORDER — FA-PYRIDOXINE-CYANOCOBALAMIN 2.5-25-2 MG PO TABS
1.0000 | ORAL_TABLET | Freq: Every day | ORAL | Status: DC
  Administered 2015-04-06: 1 via ORAL
  Filled 2015-04-06: qty 1

## 2015-04-06 MED ORDER — GABAPENTIN 300 MG PO CAPS: 900.0000 mg | ORAL_CAPSULE | Freq: Every day | ORAL | Status: DC

## 2015-04-06 MED ORDER — INFLUENZA VAC SPLIT QUAD 0.5 ML IM SUSY
0.5000 mL | PREFILLED_SYRINGE | INTRAMUSCULAR | Status: AC
  Administered 2015-04-07: 0.5 mL via INTRAMUSCULAR
  Filled 2015-04-06: qty 0.5

## 2015-04-06 MED ORDER — GABAPENTIN 300 MG PO CAPS
900.0000 mg | ORAL_CAPSULE | Freq: Every evening | ORAL | Status: DC | PRN
  Administered 2015-04-06: 900 mg via ORAL
  Filled 2015-04-06: qty 3

## 2015-04-06 MED ORDER — LORATADINE 10 MG PO TABS
10.0000 mg | ORAL_TABLET | Freq: Every day | ORAL | Status: DC
  Administered 2015-04-06: 10 mg via ORAL
  Filled 2015-04-06: qty 1

## 2015-04-06 MED ORDER — TRAZODONE HCL 100 MG PO TABS: 100.0000 mg | ORAL_TABLET | Freq: Every day | ORAL | Status: DC

## 2015-04-06 MED ORDER — CLONAZEPAM 1 MG PO TABS
1.0000 mg | ORAL_TABLET | Freq: Every day | ORAL | Status: DC
  Administered 2015-04-06: 1 mg via ORAL
  Filled 2015-04-06: qty 1

## 2015-04-06 MED ORDER — PNEUMOCOCCAL VAC POLYVALENT 25 MCG/0.5ML IJ INJ
0.5000 mL | INJECTION | INTRAMUSCULAR | Status: AC
  Administered 2015-04-07: 0.5 mL via INTRAMUSCULAR

## 2015-04-06 MED ORDER — MAGNESIUM HYDROXIDE 400 MG/5ML PO SUSP: 30.0000 mL | Freq: Every day | ORAL | Status: DC | PRN

## 2015-04-06 MED ORDER — ENSURE ENLIVE PO LIQD
237.0000 mL | Freq: Two times a day (BID) | ORAL | Status: DC
  Administered 2015-04-06 – 2015-04-09 (×5): 237 mL via ORAL

## 2015-04-06 MED ORDER — LEVOTHYROXINE SODIUM 50 MCG PO TABS
50.0000 ug | ORAL_TABLET | Freq: Every day | ORAL | Status: DC
  Administered 2015-04-07 – 2015-04-12 (×6): 50 ug via ORAL
  Filled 2015-04-06 (×2): qty 1
  Filled 2015-04-06: qty 2
  Filled 2015-04-06 (×6): qty 1

## 2015-04-06 MED ORDER — LINACLOTIDE 145 MCG PO CAPS
145.0000 ug | ORAL_CAPSULE | Freq: Every day | ORAL | Status: DC
  Administered 2015-04-06: 145 ug via ORAL
  Filled 2015-04-06: qty 1

## 2015-04-06 MED ORDER — TRAMADOL HCL 50 MG PO TABS
50.0000 mg | ORAL_TABLET | Freq: Two times a day (BID) | ORAL | Status: DC | PRN
  Administered 2015-04-06: 50 mg via ORAL
  Filled 2015-04-06: qty 1

## 2015-04-06 MED ORDER — DULOXETINE HCL 20 MG PO CPEP
20.0000 mg | ORAL_CAPSULE | Freq: Every day | ORAL | Status: DC
  Administered 2015-04-06: 20 mg via ORAL
  Filled 2015-04-06: qty 1

## 2015-04-06 MED ORDER — CLONAZEPAM 1 MG PO TABS: 1.0000 mg | ORAL_TABLET | Freq: Every day | ORAL | Status: DC

## 2015-04-06 MED ORDER — DICYCLOMINE HCL 10 MG PO CAPS
10.0000 mg | ORAL_CAPSULE | Freq: Four times a day (QID) | ORAL | Status: DC | PRN
  Filled 2015-04-06: qty 1

## 2015-04-06 MED ORDER — FLUTICASONE PROPIONATE 50 MCG/ACT NA SUSP
2.0000 | Freq: Two times a day (BID) | NASAL | Status: DC
Start: 2015-04-06 — End: 2015-04-12
  Administered 2015-04-07 – 2015-04-12 (×8): 2 via NASAL
  Filled 2015-04-06 (×3): qty 16

## 2015-04-06 MED ORDER — ACETAMINOPHEN 325 MG PO TABS
650.0000 mg | ORAL_TABLET | Freq: Four times a day (QID) | ORAL | Status: DC | PRN
  Administered 2015-04-07 – 2015-04-10 (×10): 650 mg via ORAL
  Filled 2015-04-06 (×10): qty 2

## 2015-04-06 MED ORDER — TRAMADOL HCL 50 MG PO TABS
50.0000 mg | ORAL_TABLET | Freq: Two times a day (BID) | ORAL | Status: DC | PRN
  Administered 2015-04-07: 50 mg via ORAL
  Filled 2015-04-06: qty 1

## 2015-04-06 MED ORDER — PANTOPRAZOLE SODIUM 20 MG PO TBEC
20.0000 mg | DELAYED_RELEASE_TABLET | Freq: Every day | ORAL | Status: DC
  Administered 2015-04-06: 20 mg via ORAL
  Filled 2015-04-06: qty 1

## 2015-04-06 NOTE — Progress Notes (Signed)
Adult Psychoeducational Group Note  Date:  04/06/2015 Time:  10:01 PM  Group Topic/Focus:  Wrap-Up Group:   The focus of this group is to help patients review their daily goal of treatment and discuss progress on daily workbooks.  Participation Level:  Active  Participation Quality:  Appropriate and Attentive  Affect:  Appropriate  Cognitive:  Appropriate  Insight: Appropriate and Good  Engagement in Group:  Engaged  Modes of Intervention:  Education  Additional Comments:  Pt has been concerned about not being able to receive her medication that she needs for her health issues. Pt also was worried about her change in environment but her peers have welcomed her and she feels relax.   Jerline Pain 04/06/2015, 10:01 PM

## 2015-04-06 NOTE — ED Notes (Signed)
Patient transferred to [facility].  All belongings given to the driver.  Left the unit ambulatory with [employer].

## 2015-04-06 NOTE — BH Assessment (Signed)
[facility] Assessment Progress Note  Per Corena Pilgrim, MD, this pt requires psychiatric hospitalization at this time.  Letitia Libra, RN, [facility] has assigned pt to [facility] Rm 404-1.  Pt has signed Voluntary Admission and Consent for Treatment, as well as Consent to Release Information to Dr Darleene Cleaver, her outpatient provider, and a notification call has been placed.  Signed forms have been faxed to [facility].  Pt's nurse, Gerrit Friends, has been notified, and agrees to send original paperwork along with pt via [employer], and to call report to [phone].  Jalene Mullet, [place] Triage Specialist [phone]
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
## 2015-04-06 NOTE — Consult Note (Signed)
[facility] Consult   Reason for Consult:  Suicidal ideations Referring Physician:  EDP Patient Identification: Peggy Austin MRN:  [id number] Principal Diagnosis: Severe major depression without psychotic features [facility]) Diagnosis:   Patient Active Problem List   Diagnosis Date Noted  . Severe major depression without psychotic features ([place]) [F32.2] 04/06/2015    Priority: High  . Medication side effect [T88.7XXA] 03/26/2015  . PREGNANCY-INDUCED HYPERTENSION [IMO0002] 06/28/2010  . GESTATIONAL DIABETES [O99.810] 06/28/2010  . DYSPNEA ON EXERTION [R06.09, R09.89] 06/28/2010  . OBESITY [E66.9] 09/14/2009  . CONSTIPATION [K59.00] 09/14/2009  . NAUSEA AND VOMITING [R11.2] 09/14/2009  . NAUSEA ALONE [R11.0] 09/14/2009  . FLATULENCE [R14.3, R14.1, R14.2] 09/14/2009  . ABDOMINAL PAIN, GENERALIZED [R10.84] 09/14/2009  . INTESTINAL MALABSORPTION, POSTSURGICAL [K91.2] 06/10/2008  . SKIN LESIONS, MULTIPLE [L98.9] 06/10/2008  . PRIOR LAPAROSCOPIC ROUX-EN-BARIATRIC SURGERY [Z98.84] 03/17/2008  . ANEMIA-UNSPECIFIED [D64.9] 02/13/2008  . IRRITABLE BOWEL SYNDROME [K58.9] 02/13/2008  . ABDOMINAL PAIN-MULTIPLE SITES [R10.9] 02/13/2008    Total Time spent with patient: 45 minutes  Subjective:   Peggy Austin is a 44 y.o. female patient admitted with depression with suicidal ideations.  HPI:  On admission;  44 y.o. female presenting as a [facility] walk-in c/o worsening depression and SI. Pt was just seen at [facility] on 03/25/15 due to making a suicidal statement and exhibiting bizarre behavior, believed to be the result of overuse of prescription drugs. Pt presents with disheveled appearance and poor hygiene, tangential thought pattern, and good eye-contact. Speech is of normal rate and tone, but pt does ramble on at times. There is no indication of delusional thought content and pt does not appear to be responding to internal stimuli. Mood is depressed and irritable, as pt snaps at her family  intermittently during the interview if they say something she does not like. Pt is accompanied to [facility] by her significant other and her son.   Per pt's son, the pt became upset upon learning that her children would not be with her on Halloween. She viewed this a "betrayal". Family says that the pt began screaming, throwing objects, and having panic attacks. She has expressed SI throughout the last week and currently endorses SI with multiple plans. Pt states, "I've thought of multiple ways to do it that wouldn't show up on a tox screen". Family states that the pt has not been acting like herself; pt attributes this to her recent dx of Serotonin Syndrome. However, pt's prescription medication overuse is likely a contributing factor as well. Per chart review [03/25/15], "Patient's sister states that the dentist states that she has been taking an enormous amount of pain meds. Tramadol bottle that was filled on 03/16/15 with 60 tablets is empty, Tylenol #3 that was filled on 03/22/16 -12 tablets-bottle empty, and hydrocodone tablets -unknown amount. Patient's sister stated that the patient was confronted about the narcotic use and was told that was enough to kill someone, patient replied, 'That's the point'." Pt adamantly denies SA, despite running out of her controlled meds early and even admitting to "sometimes doubling up on my Adderall for energy". Pt says "I cannot function without my Adderall". She reports an extensive family hx of prescription drug abuse. Other reported stressors include the loss of pt's mother in 2010, pt's children growing up and not having time for her, the death of her dog, financial problems, strained family relationships, recent psych med changes, and multiple health problems. Pt talks extensively about her medical issues and their impact on her mood. Depressive  sx include lack of motivation, fatigue, feelings of worthlessness, insomnia, decreased appetite with weight loss, irritability  and anger outbursts, and social isolation. Pt says she also goes days without sleeping and, per chart review, she seems to have periods of manic behavior where she cannot sit still and talks non-stop.  Today:  Patient remains suicidal and does not feel safe, contracts for safety.  She will need a bed in inpatient for stabilization.  Wellbutrin discontinued due to negative side effects, Cymbalta 20 mg daily for depression.  Past Psychiatric History: depression, anxiety  Risk to Self: Is patient at risk for suicide?: Yes Risk to Others:  none Prior Inpatient Therapy:  [facility] Prior Outpatient Therapy:  various ones  Past Medical History:  Past Medical History  Diagnosis Date  . Hernia   . IBS (irritable bowel syndrome)   . Ulcer   . Thyroid disease   . PIH (pregnancy induced hypertension)   . Asthma   . Gastroparesis   . Pancreatitis   . Partial bowel obstruction ([place])   . GERD (gastroesophageal reflux disease)   . DDD (degenerative disc disease), cervical   . Recurrent oral ulcers   . Lumbar herniated disc     Past Surgical History  Procedure Laterality Date  . Gastric bypass    . Cholecystectomy    . Tubal ligation    . Endometrial ablation    . Hernia repair    . Back surgery    . Cesarean section     Family History: History reviewed. No pertinent family history. Family Psychiatric  History: depression Social History:  History  Alcohol Use No    Comment:       History  Drug Use No    Social History   Social History  . Marital Status: Divorced    Spouse Name: N/A  . Number of Children: N/A  . Years of Education: N/A   Social History Main Topics  . Smoking status: Never Smoker   . Smokeless tobacco: Never Used  . Alcohol Use: No     Comment:    . Drug Use: No  . Sexual Activity: Not Asked   Other Topics Concern  . None   Social History Narrative   Additional Social History:                          Allergies:   Allergies  Allergen  Reactions  . Amoxicillin Rash    Has patient had a PCN reaction causing immediate rash, facial/tongue/throat swelling, SOB or lightheadedness with hypotension: unknown Has patient had a PCN reaction causing severe rash involving mucus membranes or skin necrosis: unknown Has patient had a PCN reaction that required hospitalization no Has patient had a PCN reaction occurring within the last 10 years: yes If all of the above answers are "NO", then may proceed with Cephalosporin use.   . Avelox [Moxifloxacin Hcl In Nacl]     Rash   . Doxycycline Other (See Comments)    Unknown reaction  . Morphine Hives and Itching  . Penicillins Other (See Comments)    Childhood allergy Has patient had a PCN reaction causing immediate rash, facial/tongue/throat swelling, SOB or lightheadedness with hypotension: unknown Has patient had a PCN reaction causing severe rash involving mucus membranes or skin necrosis: unknown Has patient had a PCN reaction that required hospitalization unknown Has patient had a PCN reaction occurring within the last 10 years: no If all of the above  answers are "NO", then may proceed with Cephalosporin use.   Hyman Hopes Allergy] Nausea And Vomiting    Projectile vomiting, catfish too  . Moxifloxacin Swelling and Rash    Labs:  Results for orders placed or performed during the hospital encounter of 04/05/15 (from the past 48 hour(s))  Comprehensive metabolic panel     Status: Abnormal   Collection Time: 04/05/15 10:09 PM  Result Value Ref Range   Sodium 139 135 - 145 mmol/L    Comment: REPEATED TO VERIFY   Potassium 4.4 3.5 - 5.1 mmol/L   Chloride 106 101 - 111 mmol/L    Comment: REPEATED TO VERIFY   CO2 28 22 - 32 mmol/L    Comment: REPEATED TO VERIFY   Glucose, Bld 94 65 - 99 mg/dL   BUN 12 6 - 20 mg/dL   Creatinine, Ser 0.57 0.44 - 1.00 mg/dL   Calcium 9.2 8.9 - 10.3 mg/dL   Total Protein 6.5 6.5 - 8.1 g/dL   Albumin 3.5 3.5 - 5.0 g/dL   AST 19 15 - 41 U/L    ALT 16 14 - 54 U/L   Alkaline Phosphatase 68 38 - 126 U/L   Total Bilirubin <0.1 (L) 0.3 - 1.2 mg/dL   GFR calc non Af Amer >60 >60 mL/min   GFR calc Af Amer >60 >60 mL/min    Comment: (NOTE) The eGFR has been calculated using the CKD EPI equation. This calculation has not been validated in all clinical situations. eGFR's persistently <60 mL/min signify possible Chronic Kidney Disease.    Anion gap 5 5 - 15    Comment: REPEATED TO VERIFY  Ethanol     Status: None   Collection Time: 04/05/15 10:09 PM  Result Value Ref Range   Alcohol, Ethyl (B) <5 <5 mg/dL    Comment:        LOWEST DETECTABLE LIMIT FOR SERUM ALCOHOL IS 5 mg/dL FOR MEDICAL PURPOSES ONLY   CBC with Diff     Status: Abnormal   Collection Time: 04/05/15 10:09 PM  Result Value Ref Range   WBC 5.4 4.0 - 10.5 K/uL   RBC 4.15 3.87 - 5.11 MIL/uL   Hemoglobin 10.7 (L) 12.0 - 15.0 g/dL   HCT 34.0 (L) 36.0 - 46.0 %   MCV 81.9 78.0 - 100.0 fL   MCH 25.8 (L) 26.0 - 34.0 pg   MCHC 31.5 30.0 - 36.0 g/dL   RDW 16.6 (H) 11.5 - 15.5 %   Platelets 293 150 - 400 K/uL   Neutrophils Relative % 42 %   Neutro Abs 2.3 1.7 - 7.7 K/uL   Lymphocytes Relative 44 %   Lymphs Abs 2.4 0.7 - 4.0 K/uL   Monocytes Relative 9 %   Monocytes Absolute 0.5 0.1 - 1.0 K/uL   Eosinophils Relative 4 %   Eosinophils Absolute 0.2 0.0 - 0.7 K/uL   Basophils Relative 1 %   Basophils Absolute 0.1 0.0 - 0.1 K/uL  Urine rapid drug screen (hosp performed)not at [facility]     Status: Abnormal   Collection Time: 04/05/15 10:12 PM  Result Value Ref Range   Opiates NONE DETECTED NONE DETECTED   Cocaine NONE DETECTED NONE DETECTED   Benzodiazepines POSITIVE (A) NONE DETECTED   Amphetamines NONE DETECTED NONE DETECTED   Tetrahydrocannabinol NONE DETECTED NONE DETECTED   Barbiturates NONE DETECTED NONE DETECTED    Comment:        DRUG SCREEN FOR MEDICAL PURPOSES ONLY.  IF CONFIRMATION  IS NEEDED FOR ANY PURPOSE, NOTIFY LAB WITHIN 5 DAYS.        LOWEST  DETECTABLE LIMITS FOR URINE DRUG SCREEN Drug Class       Cutoff (ng/mL) Amphetamine      1000 Barbiturate      200 Benzodiazepine   106 Tricyclics       269 Opiates          300 Cocaine          300 THC              50     Current Facility-Administered Medications  Medication Dose Route Frequency Provider Last Rate Last Dose  . albuterol (PROVENTIL HFA;VENTOLIN HFA) 108 (90 BASE) MCG/ACT inhaler 1-2 puff  1-2 puff Inhalation Q6H PRN Pattricia Boss, MD      . buPROPion (WELLBUTRIN XL) 24 hr tablet 150 mg  150 mg Oral Daily Pattricia Boss, MD   150 mg at 04/06/15 0940  . clonazePAM (KLONOPIN) tablet 1 mg  1 mg Oral QHS Patrecia Pour, NP      . docusate sodium (COLACE) capsule 100 mg  100 mg Oral TID PRN Pattricia Boss, MD      . fluticasone (FLONASE) 50 MCG/ACT nasal spray 2 spray  2 spray Each Nare BID Pattricia Boss, MD   2 spray at 04/06/15 0941  . levothyroxine (SYNTHROID, LEVOTHROID) tablet 50 mcg  50 mcg Oral QAC breakfast Pattricia Boss, MD   50 mcg at 04/06/15 0840  . Linaclotide (LINZESS) capsule 145 mcg  145 mcg Oral Daily PRN Pattricia Boss, MD      . multivitamin with minerals tablet 1 tablet  1 tablet Oral Daily Pattricia Boss, MD   1 tablet at 04/06/15 0940  . ondansetron (ZOFRAN) tablet 4 mg  4 mg Oral Q8H PRN Pattricia Boss, MD   4 mg at 04/06/15 1224  . traMADol (ULTRAM) tablet 50 mg  50 mg Oral Q12H PRN Patrecia Pour, NP   50 mg at 04/06/15 1213  . traZODone (DESYREL) tablet 100 mg  100 mg Oral QHS Legaci Tarman       Current Outpatient Prescriptions  Medication Sig Dispense Refill  . albuterol (PROVENTIL HFA;VENTOLIN HFA) 108 (90 BASE) MCG/ACT inhaler Inhale 1-2 puffs into the lungs every 6 (six) hours as needed for wheezing or shortness of breath.    . amphetamine-dextroamphetamine (ADDERALL) 20 MG tablet Take 20 mg by mouth 3 (three) times daily.    Marland Kitchen buPROPion (WELLBUTRIN XL) 150 MG 24 hr tablet Take 1 tablet (150 mg total) by mouth daily. 30 tablet 1  . clonazePAM (KLONOPIN)  1 MG tablet Take 2 mg by mouth at bedtime.     . Cyanocobalamin (B-12 PO) Take 1 tablet by mouth daily.    . cyclobenzaprine (FLEXERIL) 10 MG tablet Take 1 tablet (10 mg total) by mouth 2 (two) times daily as needed for muscle spasms. 20 tablet 0  . dicyclomine (BENTYL) 10 MG capsule Take 10 mg by mouth daily.    Marland Kitchen docusate sodium (COLACE) 100 MG capsule Take 100 mg by mouth 3 (three) times daily as needed for mild constipation.    . ferrous sulfate dried (SLOW FE) 160 (50 FE) MG TBCR Take 160 mg by mouth 3 (three) times daily with meals.     . fluticasone (FLONASE) 50 MCG/ACT nasal spray Place 2 sprays into both nostrils 2 (two) times daily.    Marland Kitchen gabapentin (NEURONTIN) 300 MG capsule Take 900 mg by  mouth at bedtime as needed (depending on how tired she is).     Marland Kitchen levothyroxine (SYNTHROID, LEVOTHROID) 50 MCG tablet Take 50 mcg by mouth daily before breakfast.    . Linaclotide (LINZESS) 145 MCG CAPS capsule Take 145 mcg by mouth daily as needed (severe constipation).     . multivitamin (THERAGRAN) per tablet Take 3 tablets by mouth daily.     . mupirocin cream (BACTROBAN) 2 % Apply 1 application topically 2 (two) times daily. (Patient taking differently: Apply 1 application topically 2 (two) times daily as needed (flare up). ) 15 g 0  . omeprazole (PRILOSEC) 20 MG capsule Take 20 mg by mouth daily.    . ondansetron (ZOFRAN) 4 MG tablet Take 1 tablet (4 mg total) by mouth every 8 (eight) hours as needed for nausea or vomiting. 12 tablet 0  . pantoprazole (PROTONIX) 40 MG tablet Take 40 mg by mouth daily.    . polyethylene glycol powder (GLYCOLAX/MIRALAX) powder Take 17 g by mouth daily. Until daily soft stools  OTC (Patient taking differently: Take 17 g by mouth 2 (two) times daily as needed for moderate constipation. ) 119 g 0  . promethazine (PHENERGAN) 25 MG tablet Take 25 mg by mouth every 6 (six) hours as needed for nausea or vomiting.    . traMADol (ULTRAM) 50 MG tablet Take 50 mg by mouth  every 12 (twelve) hours as needed for moderate pain.    . [DISCONTINUED] citalopram (CELEXA) 20 MG tablet Take 20 mg by mouth daily.      . [DISCONTINUED] metFORMIN (GLUCOPHAGE) 500 MG tablet Take 500 mg by mouth 2 (two) times daily with a meal.        Musculoskeletal: Strength & Muscle Tone: within normal limits Gait & Station: normal Patient leans: N/A  Psychiatric Specialty Exam: Review of Systems  Constitutional: Negative.   HENT: Negative.   Eyes: Negative.   Respiratory: Negative.   Cardiovascular: Negative.   Gastrointestinal: Negative.   Genitourinary: Negative.   Musculoskeletal: Negative.   Skin: Negative.   Neurological: Negative.   Endo/Heme/Allergies: Negative.   Psychiatric/Behavioral: Positive for depression and suicidal ideas. The patient is nervous/anxious.     Blood pressure 94/57, pulse 83, temperature 98.1 F (36.7 C), temperature source Oral, resp. rate 18, height _0  (1.575 m), weight 68.04 kg (150 lb), SpO2 100 %.Body mass index is 27.43 kg/(m^2).  General Appearance: Disheveled  Eye Sport and exercise psychologist::  Fair  Speech:  Normal Rate  Volume:  Normal  Mood:  Anxious and Depressed  Affect:  Congruent  Thought Process:  Coherent  Orientation:  Full (Time, Place, and Person)  Thought Content:  Rumination  Suicidal Thoughts:  Yes.  with intent/plan  Homicidal Thoughts:  No  Memory:  Immediate;   Fair Recent;   Fair Remote;   Fair  Judgement:  Impaired  Insight:  Fair  Psychomotor Activity:  Decreased  Concentration:  Fair  Recall:  [employer] of Knowledge:Fair  Language: Good  Akathisia:  No  Handed:  Right  AIMS (if indicated):     Assets:  Housing Leisure Time Physical Health Resilience Social Support  ADL's:  Intact  Cognition: WNL  Sleep:      Treatment Plan Summary: Daily contact with patient to assess and evaluate symptoms and progress in treatment, Medication management and Plan major depression, recurrent, severe, without  psychosis: -Crisis stabilization -Medication management:  Klonopin 2 mg at bedtime decreased to 1 mg for anxiety and sleep, Trazodone 100 mg at  bedtime for sleep, Wellbutrin 150 mg daily discontinued, Cymbalta 20 mg daily for depression started -Individual counseling  Disposition: Recommend psychiatric Inpatient admission when medically cleared.  Waylan Boga, [place] 04/06/2015 12:28 PM Patient seen face-to-face for psychiatric evaluation, chart reviewed and case discussed with the physician extender and developed treatment plan. Reviewed the information documented and agree with the treatment plan. Corena Pilgrim, MD

## 2015-04-06 NOTE — [facility] Counselor (Signed)
TTS counselor sent pt referral to the following facilities in effort to obtain inpt placement:  [facility]

## 2015-04-06 NOTE — Progress Notes (Signed)
D: Pt denies SI/HI/AVH. Pt is pleasant and cooperative. Pt stated she was doing good today, pt was feeling sad upon approach, but brightened on talking and interacting. Pt has been having issues with her IBS, which has been causing her increased depression that led to SI thoughts.  A: Pt was offered support and encouragement. Pt was given scheduled medications. Pt was encourage to attend groups. Q 15 minute checks were done for safety.   R:Pt attends groups and interacts well with peers and staff. Pt is taking medication. Pt has no complaints at this time .Pt receptive to treatment and safety maintained on unit.

## 2015-04-06 NOTE — Tx Team (Signed)
Initial Interdisciplinary Treatment Plan   PATIENT STRESSORS: Health problems Medication change or noncompliance   PATIENT STRENGTHS: Ability for insight General fund of knowledge Motivation for treatment/growth   PROBLEM LIST: Problem List/Patient Goals Date to be addressed Date deferred Reason deferred Estimated date of resolution  "address anxiety"  04/06/15     "sleep" 04/06/15     "meds adjusted"  04/06/15                                          DISCHARGE CRITERIA:  Ability to meet basic life and health needs Adequate post-discharge living arrangements Improved stabilization in mood, thinking, and/or behavior  PRELIMINARY DISCHARGE PLAN: Attend aftercare/continuing care group Attend PHP/IOP  PATIENT/FAMIILY INVOLVEMENT: This treatment plan has been presented to and reviewed with the patient, Peggy Austin, and/or family member.  The patient and family have been given the opportunity to ask questions and make suggestions.  Jaimarie Rapozo L 04/06/2015, 4:10 PM

## 2015-04-07 DIAGNOSIS — F332 Major depressive disorder, recurrent severe without psychotic features: Principal | ICD-10-CM

## 2015-04-07 MED ORDER — CLONAZEPAM 1 MG PO TABS
1.0000 mg | ORAL_TABLET | Freq: Every day | ORAL | Status: DC
  Administered 2015-04-07 – 2015-04-11 (×5): 1 mg via ORAL
  Filled 2015-04-07 (×5): qty 1

## 2015-04-07 MED ORDER — CLONAZEPAM 0.5 MG PO TABS: 0.5000 mg | ORAL_TABLET | Freq: Two times a day (BID) | ORAL | Status: DC

## 2015-04-07 MED ORDER — GABAPENTIN 300 MG PO CAPS
600.0000 mg | ORAL_CAPSULE | Freq: Every day | ORAL | Status: DC
  Administered 2015-04-07: 600 mg via ORAL
  Filled 2015-04-07 (×4): qty 2

## 2015-04-07 MED ORDER — IBUPROFEN 600 MG PO TABS
600.0000 mg | ORAL_TABLET | Freq: Four times a day (QID) | ORAL | Status: DC | PRN
  Administered 2015-04-12: 600 mg via ORAL
  Filled 2015-04-07 (×2): qty 1

## 2015-04-07 MED ORDER — ONDANSETRON 4 MG PO TBDP
4.0000 mg | ORAL_TABLET | Freq: Three times a day (TID) | ORAL | Status: DC | PRN
  Administered 2015-04-07 – 2015-04-11 (×7): 4 mg via ORAL
  Filled 2015-04-07 (×10): qty 1

## 2015-04-07 MED ORDER — CLONAZEPAM 0.5 MG PO TABS
0.5000 mg | ORAL_TABLET | Freq: Every morning | ORAL | Status: DC
  Administered 2015-04-07 – 2015-04-12 (×6): 0.5 mg via ORAL
  Filled 2015-04-07 (×6): qty 1

## 2015-04-07 MED ORDER — GABAPENTIN 300 MG PO CAPS
300.0000 mg | ORAL_CAPSULE | Freq: Every morning | ORAL | Status: DC
  Administered 2015-04-07 – 2015-04-08 (×2): 300 mg via ORAL
  Filled 2015-04-07 (×4): qty 1

## 2015-04-07 NOTE — Tx Team (Signed)
Interdisciplinary Treatment Plan Update (Adult) Date: 04/07/2015   Date: 04/07/2015 1:12 PM  Progress in Treatment:  Attending groups: Yes  Participating in groups: Yes  Taking medication as prescribed: Yes  Tolerating medication: Yes  Family/Significant othe contact made: No, Pt declines family contact  Patient understands diagnosis: Yes AEB seeking help for depression  Discussing patient identified problems/goals with staff: Yes  Medical problems stabilized or resolved: Yes  Denies suicidal/homicidal ideation: Yes Patient has not harmed self or Others: Yes   New problem(s) identified: None identified at this time.   Discharge Plan or Barriers: Pt will return to her father's home and follow-up with [facility]  Additional comments: n/a   Reason for Continuation of Hospitalization:  Depression Medication stabilization Suicidal ideation   Estimated length of stay: 3-5 days  Review of initial/current patient goals per problem list:   1.  Goal(s): Patient will participate in aftercare plan  Met:  Yes  Target date: 3-5 days from date of admission   As evidenced by: Patient will participate within aftercare plan AEB aftercare provider and housing plan at discharge being identified.   04/07/15: Pt will return home and follow-up with [facility]  2.  Goal (s): Patient will exhibit decreased depressive symptoms and suicidal ideations.  Met:  No  Target date: 3-5 days from date of admission   As evidenced by: Patient will utilize self rating of depression at 3 or below and demonstrate decreased signs of depression or be deemed stable for discharge by MD.  04/07/15: Pt recently admitted with high levels of depression but reports that it is getting better. Rating depression at 7/10; denies SI  3.  Goal(s): Patient will demonstrate decreased signs and symptoms of anxiety.  Met:  No  Target date: 3-5 days from date of admission   As  evidenced by: Patient will utilize self rating of anxiety at 3 or below and demonstrated decreased signs of anxiety, or be deemed stable for discharge by MD 04/07/15: Pt was admitted with increased levels of anxiety and is currently rating those symptoms highly. Pt will demonstrated decreased symptoms of anxiety and rate it at 3/10 prior to d/c.  Attendees:  Patient:    Family:    Physician: Dr. Cobos, MD  04/07/2015 1:12 PM  Nursing: Jennifer Clark, RN Case manager  04/07/2015 1:12 PM  Clinical Social Worker  Carter, LCSWA, MSW 04/07/2015 1:12 PM  Other: Valerie Enoch, [facility] 04/07/2015 1:12 PM  Clinical: Beverly Knight, RN 04/07/2015 1:12 PM  Other: , RN Charge Nurse 04/07/2015 1:12 PM  Other:      Carter, LCSWA MSW   

## 2015-04-07 NOTE — H&P (Signed)
Psychiatric Admission Assessment Adult  Patient Identification: Peggy Austin MRN:  [id number] Date of Evaluation:  04/07/2015 Chief Complaint:  " I had serotonin syndrome " Principal Diagnosis:  Major Depression, Recurrent , no psychotic features  Diagnosis:   Patient Active Problem List   Diagnosis Date Noted  . Severe major depression without psychotic features ([place]) [F32.2] 04/06/2015  . Major depressive disorder, recurrent episode, severe ([place]) [F33.2] 04/06/2015  . Medication side effect [T88.7XXA] 03/26/2015  . PREGNANCY-INDUCED HYPERTENSION [IMO0002] 06/28/2010  . GESTATIONAL DIABETES [O99.810] 06/28/2010  . DYSPNEA ON EXERTION [R06.09, R09.89] 06/28/2010  . OBESITY [E66.9] 09/14/2009  . CONSTIPATION [K59.00] 09/14/2009  . NAUSEA AND VOMITING [R11.2] 09/14/2009  . NAUSEA ALONE [R11.0] 09/14/2009  . FLATULENCE [R14.3, R14.1, R14.2] 09/14/2009  . ABDOMINAL PAIN, GENERALIZED [R10.84] 09/14/2009  . INTESTINAL MALABSORPTION, POSTSURGICAL [K91.2] 06/10/2008  . SKIN LESIONS, MULTIPLE [L98.9] 06/10/2008  . PRIOR LAPAROSCOPIC ROUX-EN-BARIATRIC SURGERY [Z98.84] 03/17/2008  . ANEMIA-UNSPECIFIED [D64.9] 02/13/2008  . IRRITABLE BOWEL SYNDROME [K58.9] 02/13/2008  . ABDOMINAL PAIN-MULTIPLE SITES [R10.9] 02/13/2008   History of Present Illness:: 44 year old female. States that about 1-2 weeks ago she developed " Serotonin Syndrome " related to prozac, ultram  Combination. Patient states she does not remember episode well, but was told by family members she was incoherent, " twitching", irritable,  and that " they thought I was on drugs ". She states she was brought to the hospital , and was discharged after a couple of days . Patient states that her brother was visiting from [place], and that after he left late last week, she felt intensely depressed, ruminative about her stressors ( not working, brother left, adolescent son soon to leave home ) . She states she told her boyfriend that "  I did not want to live anymore " , so that " my boyfriend and my son kept me under close observation" , and eventually decided to bring her to the hospital.  Associated Signs/Symptoms: Depression Symptoms:  depressed mood, anhedonia, insomnia, difficulty concentrating, suicidal thoughts without plan, loss of energy/fatigue, disturbed sleep, decreased appetite, has lost about 15 lbs over recent weeks  (Hypo) Manic Symptoms: denies  Anxiety Symptoms:  Describes significant anxiety,  But no recent panic attacks. No agoraphobia . Psychotic Symptoms:  Denies  PTSD Symptoms: At this time not endorsing  PTSD symptoms at present  Total Time spent with patient: 45 minutes  Past Psychiatric History:  She sees Dr. Darleene Cleaver for outpatient psychiatric management. States she has been diagnosed with ADHD. Has a history of depression, and describe previous episodes of depression, although most often mild. No history of suicide attempts, denies history of violence, denies history of self injurious behaviors, no prior psychiatric admissions. Denies any clear history of mania or of psychosis. Prior to above described episode suggestive of Serotonin Syndrome was on adderall , prozac, neurontin, klonopin .    Risk to Self: Is patient at risk for suicide?: No Risk to Others:   Prior Inpatient Therapy:   Prior Outpatient Therapy:    Alcohol Screening: 1. How often do you have a drink containing alcohol?: Never 9. Have you or someone else been injured as a result of your drinking?: No 10. Has a relative or friend or a doctor or another health worker been concerned about your drinking or suggested you cut down?: No Alcohol Use Disorder Identification Test Final Score (AUDIT): 0 Brief Intervention: AUDIT score less than 7 or less-screening does not suggest unhealthy drinking-brief intervention not indicated Substance Abuse History  in the last 12 months:  Denies alcohol abuse , states  She has history of  taking more pain medications than prescribed, denies abusing Adderall or BZD .   Consequences of Substance Abuse:  denies - does state family has been worried about " me being on addictive medications" Previous Psychotropic Medications: yes- as noted, most recently on prozac, neurontin, adderall. Prozac was stopped due to recent suspected serotonin syndrome- she then started Wellbutrin XL for three days, but states it was stopped as it was felt to be increasing her anxiety. At this time on Cymbalta .  Psychological Evaluations: No  Past Medical History: reports history of chronic GI pain, related to gastroparesis, GERD, IBS. Does not smoke. Past Medical History  Diagnosis Date  . Hernia   . IBS (irritable bowel syndrome)   . Ulcer   . Thyroid disease   . PIH (pregnancy induced hypertension)   . Asthma   . Gastroparesis   . Pancreatitis   . Partial bowel obstruction ([place])   . GERD (gastroesophageal reflux disease)   . DDD (degenerative disc disease), cervical   . Recurrent oral ulcers   . Lumbar herniated disc     Past Surgical History  Procedure Laterality Date  . Gastric bypass    . Cholecystectomy    . Tubal ligation    . Endometrial ablation    . Hernia repair    . Back surgery    . Cesarean section     Family History:  Father alive, mother passed away 31 years , has two surviving siblings  Family Psychiatric  History: one sister has history of depression, one sister committed suicide 2008, patient suspects she was schizophrenic.  Mother and sister had history of drug abuse ,alcohol abuse .  Social History:  Divorced,  Has supportive boyfriend, lives with father and two sons  ( ages 36, 22), currently unemployed, last worked 8 years ago. States that she is in the process of applying to disability. Denies legal issues, no acute financial issues .  History  Alcohol Use No    Comment:       History  Drug Use No    Social History   Social History  . Marital Status:  Divorced    Spouse Name: N/A  . Number of Children: N/A  . Years of Education: N/A   Social History Main Topics  . Smoking status: Never Smoker   . Smokeless tobacco: Never Used  . Alcohol Use: No     Comment:    . Drug Use: No  . Sexual Activity: Not Asked   Other Topics Concern  . None   Social History Narrative   Additional Social History:   Allergies:   Allergies  Allergen Reactions  . Amoxicillin Rash    Has patient had a PCN reaction causing immediate rash, facial/tongue/throat swelling, SOB or lightheadedness with hypotension: unknown Has patient had a PCN reaction causing severe rash involving mucus membranes or skin necrosis: unknown Has patient had a PCN reaction that required hospitalization no Has patient had a PCN reaction occurring within the last 10 years: yes If all of the above answers are "NO", then may proceed with Cephalosporin use.   . Avelox [Moxifloxacin Hcl In Nacl]     Rash   . Doxycycline Other (See Comments)    Unknown reaction  . Morphine Hives and Itching  . Penicillins Other (See Comments)    Childhood allergy Has patient had a PCN reaction causing immediate rash, facial/tongue/throat  swelling, SOB or lightheadedness with hypotension: unknown Has patient had a PCN reaction causing severe rash involving mucus membranes or skin necrosis: unknown Has patient had a PCN reaction that required hospitalization unknown Has patient had a PCN reaction occurring within the last 10 years: no If all of the above answers are "NO", then may proceed with Cephalosporin use.   Hyman Hopes Allergy] Nausea And Vomiting    Projectile vomiting, catfish too  . Moxifloxacin Swelling and Rash   Lab Results:  Results for orders placed or performed during the hospital encounter of 04/05/15 (from the past 48 hour(s))  Comprehensive metabolic panel     Status: Abnormal   Collection Time: 04/05/15 10:09 PM  Result Value Ref Range   Sodium 139 135 - 145 mmol/L     Comment: REPEATED TO VERIFY   Potassium 4.4 3.5 - 5.1 mmol/L   Chloride 106 101 - 111 mmol/L    Comment: REPEATED TO VERIFY   CO2 28 22 - 32 mmol/L    Comment: REPEATED TO VERIFY   Glucose, Bld 94 65 - 99 mg/dL   BUN 12 6 - 20 mg/dL   Creatinine, Ser 0.57 0.44 - 1.00 mg/dL   Calcium 9.2 8.9 - 10.3 mg/dL   Total Protein 6.5 6.5 - 8.1 g/dL   Albumin 3.5 3.5 - 5.0 g/dL   AST 19 15 - 41 U/L   ALT 16 14 - 54 U/L   Alkaline Phosphatase 68 38 - 126 U/L   Total Bilirubin <0.1 (L) 0.3 - 1.2 mg/dL   GFR calc non Af Amer >60 >60 mL/min   GFR calc Af Amer >60 >60 mL/min    Comment: (NOTE) The eGFR has been calculated using the CKD EPI equation. This calculation has not been validated in all clinical situations. eGFR's persistently <60 mL/min signify possible Chronic Kidney Disease.    Anion gap 5 5 - 15    Comment: REPEATED TO VERIFY  Ethanol     Status: None   Collection Time: 04/05/15 10:09 PM  Result Value Ref Range   Alcohol, Ethyl (B) <5 <5 mg/dL    Comment:        LOWEST DETECTABLE LIMIT FOR SERUM ALCOHOL IS 5 mg/dL FOR MEDICAL PURPOSES ONLY   CBC with Diff     Status: Abnormal   Collection Time: 04/05/15 10:09 PM  Result Value Ref Range   WBC 5.4 4.0 - 10.5 K/uL   RBC 4.15 3.87 - 5.11 MIL/uL   Hemoglobin 10.7 (L) 12.0 - 15.0 g/dL   HCT 34.0 (L) 36.0 - 46.0 %   MCV 81.9 78.0 - 100.0 fL   MCH 25.8 (L) 26.0 - 34.0 pg   MCHC 31.5 30.0 - 36.0 g/dL   RDW 16.6 (H) 11.5 - 15.5 %   Platelets 293 150 - 400 K/uL   Neutrophils Relative % 42 %   Neutro Abs 2.3 1.7 - 7.7 K/uL   Lymphocytes Relative 44 %   Lymphs Abs 2.4 0.7 - 4.0 K/uL   Monocytes Relative 9 %   Monocytes Absolute 0.5 0.1 - 1.0 K/uL   Eosinophils Relative 4 %   Eosinophils Absolute 0.2 0.0 - 0.7 K/uL   Basophils Relative 1 %   Basophils Absolute 0.1 0.0 - 0.1 K/uL  Urine rapid drug screen (hosp performed)not at [facility]     Status: Abnormal   Collection Time: 04/05/15 10:12 PM  Result Value Ref Range   Opiates  NONE DETECTED NONE DETECTED   Cocaine  NONE DETECTED NONE DETECTED   Benzodiazepines POSITIVE (A) NONE DETECTED   Amphetamines NONE DETECTED NONE DETECTED   Tetrahydrocannabinol NONE DETECTED NONE DETECTED   Barbiturates NONE DETECTED NONE DETECTED    Comment:        DRUG SCREEN FOR MEDICAL PURPOSES ONLY.  IF CONFIRMATION IS NEEDED FOR ANY PURPOSE, NOTIFY LAB WITHIN 5 DAYS.        LOWEST DETECTABLE LIMITS FOR URINE DRUG SCREEN Drug Class       Cutoff (ng/mL) Amphetamine      1000 Barbiturate      200 Benzodiazepine   858 Tricyclics       850 Opiates          300 Cocaine          300 THC              50     Metabolic Disorder Labs:  No results found for: HGBA1C, MPG No results found for: PROLACTIN No results found for: CHOL, TRIG, HDL, CHOLHDL, VLDL, LDLCALC  Current Medications: Current Facility-Administered Medications  Medication Dose Route Frequency Provider Last Rate Last Dose  . acetaminophen (TYLENOL) tablet 650 mg  650 mg Oral Q6H PRN Patrecia Pour, NP   650 mg at 04/07/15 0752  . albuterol (PROVENTIL) (2.5 MG/3ML) 0.083% nebulizer solution 3 mL  3 mL Inhalation Q6H PRN Patrecia Pour, NP      . alum & mag hydroxide-simeth (MAALOX/MYLANTA) 200-200-20 MG/5ML suspension 30 mL  30 mL Oral Q4H PRN Patrecia Pour, NP      . clonazePAM Bobbye [place]) tablet 1 mg  1 mg Oral Daily Patrecia Pour, NP   1 mg at 04/06/15 2201  . DULoxetine (CYMBALTA) DR capsule 20 mg  20 mg Oral Daily Patrecia Pour, NP   20 mg at 04/07/15 0744  . feeding supplement (ENSURE ENLIVE) (ENSURE ENLIVE) liquid 237 mL  237 mL Oral BID BM Myer Peer Cobos, MD   237 mL at 04/06/15 1711  . fluticasone (FLONASE) 50 MCG/ACT nasal spray 2 spray  2 spray Each Nare BID Patrecia Pour, NP   2 spray at 04/07/15 0743  . gabapentin (NEURONTIN) capsule 900 mg  900 mg Oral QHS PRN Patrecia Pour, NP   900 mg at 04/06/15 2200  . levothyroxine (SYNTHROID, LEVOTHROID) tablet 50 mcg  50 mcg Oral QAC breakfast Patrecia Pour,  NP   50 mcg at 04/07/15 0640  . Linaclotide (LINZESS) capsule 145 mcg  145 mcg Oral Daily PRN Patrecia Pour, NP      . loratadine (CLARITIN) tablet 10 mg  10 mg Oral Daily Patrecia Pour, NP   10 mg at 04/07/15 0744  . magnesium hydroxide (MILK OF MAGNESIA) suspension 30 mL  30 mL Oral Daily PRN Patrecia Pour, NP      . ondansetron (ZOFRAN-ODT) disintegrating tablet 4 mg  4 mg Oral Q8H PRN Laverle Hobby, PA-C   4 mg at 04/07/15 2774  . pantoprazole (PROTONIX) EC tablet 20 mg  20 mg Oral Daily Patrecia Pour, NP   20 mg at 04/07/15 0744  . traMADol (ULTRAM) tablet 50 mg  50 mg Oral Q12H PRN Benjamine Mola, FNP   50 mg at 04/07/15 0203   PTA Medications: Prescriptions prior to admission  Medication Sig Dispense Refill Last Dose  . albuterol (PROVENTIL HFA;VENTOLIN HFA) 108 (90 BASE) MCG/ACT inhaler Inhale 1-2 puffs into the lungs every 6 (six) hours as needed  for wheezing or shortness of breath.   Past Week at Unknown time  . amphetamine-dextroamphetamine (ADDERALL) 20 MG tablet Take 20 mg by mouth 3 (three) times daily.   04/04/2015 at Unknown time  . buPROPion (WELLBUTRIN XL) 150 MG 24 hr tablet Take 1 tablet (150 mg total) by mouth daily. 30 tablet 1 04/05/2015 at Unknown time  . clonazePAM (KLONOPIN) 1 MG tablet Take 2 mg by mouth at bedtime.    04/04/2015 at Unknown time  . Cyanocobalamin (B-12 PO) Take 1 tablet by mouth daily.   Past Week at Unknown time  . cyclobenzaprine (FLEXERIL) 10 MG tablet Take 1 tablet (10 mg total) by mouth 2 (two) times daily as needed for muscle spasms. 20 tablet 0 Past Month at Unknown time  . dicyclomine (BENTYL) 10 MG capsule Take 10 mg by mouth daily.   04/05/2015 at Unknown time  . docusate sodium (COLACE) 100 MG capsule Take 100 mg by mouth 3 (three) times daily as needed for mild constipation.   Past Month at Unknown time  . ferrous sulfate dried (SLOW FE) 160 (50 FE) MG TBCR Take 160 mg by mouth 3 (three) times daily with meals.    Past Week at Unknown  time  . fluticasone (FLONASE) 50 MCG/ACT nasal spray Place 2 sprays into both nostrils 2 (two) times daily.   04/05/2015 at Unknown time  . gabapentin (NEURONTIN) 300 MG capsule Take 900 mg by mouth at bedtime as needed (depending on how tired she is).    04/04/2015 at Unknown time  . levothyroxine (SYNTHROID, LEVOTHROID) 50 MCG tablet Take 50 mcg by mouth daily before breakfast.   04/05/2015 at Unknown time  . Linaclotide (LINZESS) 145 MCG CAPS capsule Take 145 mcg by mouth daily as needed (severe constipation).    unknown  . multivitamin (THERAGRAN) per tablet Take 3 tablets by mouth daily.    04/05/2015 at Unknown time  . mupirocin cream (BACTROBAN) 2 % Apply 1 application topically 2 (two) times daily. (Patient taking differently: Apply 1 application topically 2 (two) times daily as needed (flare up). ) 15 g 0 unknown  . omeprazole (PRILOSEC) 20 MG capsule Take 20 mg by mouth daily.   04/05/2015 at Unknown time  . ondansetron (ZOFRAN) 4 MG tablet Take 1 tablet (4 mg total) by mouth every 8 (eight) hours as needed for nausea or vomiting. 12 tablet 0 Past Week at Unknown time  . pantoprazole (PROTONIX) 40 MG tablet Take 40 mg by mouth daily.   04/05/2015 at Unknown time  . polyethylene glycol powder (GLYCOLAX/MIRALAX) powder Take 17 g by mouth daily. Until daily soft stools  OTC (Patient taking differently: Take 17 g by mouth 2 (two) times daily as needed for moderate constipation. ) 119 g 0 04/05/2015 at Unknown time  . promethazine (PHENERGAN) 25 MG tablet Take 25 mg by mouth every 6 (six) hours as needed for nausea or vomiting.   04/05/2015 at Unknown time  . traMADol (ULTRAM) 50 MG tablet Take 50 mg by mouth every 12 (twelve) hours as needed for moderate pain.   04/04/2015 at Unknown time    Musculoskeletal: Strength & Muscle Tone: within normal limits Gait & Station: normal Patient leans: N/A  Psychiatric Specialty Exam: Physical Exam  Review of Systems  Constitutional: Positive for  weight loss.  Eyes: Negative.   Respiratory: Negative.   Cardiovascular: Negative.   Gastrointestinal: Positive for heartburn and abdominal pain. Negative for blood in stool.  Genitourinary: Negative.   Musculoskeletal: Negative.  Chronic back pain  Skin: Negative.   Neurological: Positive for headaches. Negative for seizures.  Endo/Heme/Allergies: Negative.   Psychiatric/Behavioral: Positive for depression and suicidal ideas. The patient is nervous/anxious and has insomnia.     Blood pressure 106/72, pulse 98, temperature 97.8 F (36.6 C), temperature source Oral, resp. rate 14, height _0  (1.575 m), weight 156 lb (70.761 kg).Body mass index is 28.53 kg/(m^2).  General Appearance: Fairly Groomed  Engineer, water::  Good  Speech:  Normal Rate  Volume:  Normal  Mood:  Depressed  Affect:  Congruent and reactive, mildly anxious  Thought Process:  Linear  Orientation:  Full (Time, Place, and Person)  Thought Content:  denies hallucinations, no delusions , not internally preoccupied   Suicidal Thoughts:  No- denies any suicidal ideations or self injurious ideations at this time , and contracts for safety at this time  Homicidal Thoughts:  No  Memory:  recent and remote grossly intact   Judgement:  Fair  Insight:  Fair  Psychomotor Activity:  Normal- no restlessness noted   Concentration:  Good  Recall:  Good  Fund of Knowledge:Good  Language: Good  Akathisia:  Negative  Handed:  Right  AIMS (if indicated):     Assets:  Communication Skills Desire for Improvement Resilience Social Support  ADL's:  Intact  Cognition: WNL  Sleep:  Number of Hours: 5.75     Treatment Plan Summary: Daily contact with patient to assess and evaluate symptoms and progress in treatment, Medication management, Plan inpatient admission and medications as below   Observation Level/Precautions:  15 minute checks  Laboratory:  Vitamin B12, Vitamin D   Psychotherapy:  Therapy, medication  management   Medications:  Continue Cymbalta at 20 mgrs QDAY at this time, Change Neurontin to 300 mgrs QAM and 600 mgrs QHS for pain and anxiety , continue Klonopin 0.5 mgrs AM and 1 mgr QHS  For anxiety and insomnia  . D/C Ultram, due to concerns about potential interactions with other medications, serotonin syndrome   Consultations:  As needed   Discharge Concerns:  -   Estimated LOS: 5 days   Other:     I certify that inpatient services furnished can reasonably be expected to improve the patient's condition.   COBOS, FERNANDO 11/1/201610:07 AM

## 2015-04-07 NOTE — Plan of Care (Signed)
Problem: Alteration in mood Goal: LTG-Patient reports reduction in suicidal thoughts (Patient reports reduction in suicidal thoughts and is able to verbalize a safety plan for whenever patient is feeling suicidal)  Outcome: Progressing Pt denies SI at this time     

## 2015-04-07 NOTE — Progress Notes (Signed)
Recreation Therapy Notes  Animal-Assisted Activity (AAA) Program Checklist/Progress Notes Patient Eligibility Criteria Checklist & Daily Group note for Rec Tx Intervention  Date: 11.01.2016 Time: 2:15pm Location: [address]   AAA/T Program Assumption of Risk Form signed by Patient/ or Parent Legal Guardian yes  Patient is free of allergies or sever asthma yes  Patient reports no fear of animals yes  Patient reports no history of cruelty to animals yes  Patient understands his/her participation is voluntary yes  Patient washes hands before animal contact yes  Patient washes hands after animal contact yes  Behavioral Response: Appropriate   Education: Hand Washing, Appropriate Animal Interaction   Education Outcome: Acknowledges education.   Clinical Observations/Feedback: Patient interacted appropriately with therapy dog, petting him and interacting with peers appropriately during session.   Laureen Ochs Riddik Senna, LRT/CTRS        Chalisa Kobler L 04/07/2015 2:35 PM

## 2015-04-07 NOTE — Progress Notes (Signed)
D: Pt denies SI/HI/AVH. Pt is pleasant and cooperative. Pt concerned that pain meds D/C'd. Pt stated she was doing ok other than her pain meds.   A: Pt was offered support and encouragement. Pt was given scheduled medications. Pt was encourage to attend groups. Q 15 minute checks were done for safety.   R:Pt attends groups and interacts well with peers and staff. Pt is taking medication. Pt receptive to treatment and safety maintained on unit.

## 2015-04-07 NOTE — [facility] Group Notes (Signed)

## 2015-04-07 NOTE — [facility] Group Notes (Signed)
Adult Psychoeducational Group Note  Date:  04/07/2015 Time:  8:55 PM  Group Topic/Focus:  Wrap-Up Group:   The focus of this group is to help patients review their daily goal of treatment and discuss progress on daily workbooks.  Participation Level:  Active  Participation Quality:  Appropriate and Attentive  Affect:  Appropriate  Cognitive:  Alert and Appropriate  Insight: Appropriate  Engagement in Group:  Engaged  Modes of Intervention:  Discussion and Education  Additional Comments:  Pt stated her day was moderate and she was in pain.  Marlowe Shores D 04/07/2015, 8:55 PM

## 2015-04-07 NOTE — Progress Notes (Signed)
NUTRITION ASSESSMENT  Pt identified as at risk on the Malnutrition Screen Tool  INTERVENTION: 1. Educated patient on the importance of nutrition and encouraged intake of food and beverages. 2. Discussed weight goals. 3. Supplements: continue Ensure Enlive BID, each supplement provides 350 kcal and 20 grams of protein   NUTRITION DIAGNOSIS: Unintentional weight loss related to sub-optimal intake as evidenced by pt report.   Goal: Pt to meet >/= 90% of their estimated nutrition needs.  Monitor:  PO intake  Assessment:  Pt seen for MST. Pt with SI PTA and other medical issues, including insomnia.   Per report, pt has not been eating well the past 3 days due to current episodes. Per review, pt has lost 10 lbs (6% body weight) in the past 7 months which is not significant for time frame.  Ensure Jeanne Ivan has already been ordered BID.  44 y.o. female  Height: Ht Readings from Last 1 Encounters:  04/06/15 5\' 2"  (1.575 m)    Weight: Wt Readings from Last 1 Encounters:  04/06/15 156 lb (70.761 kg)    Weight Hx: Wt Readings from Last 10 Encounters:  04/06/15 156 lb (70.761 kg)  04/05/15 150 lb (68.04 kg)  10/22/14 155 lb (70.308 kg)  08/28/14 160 lb (72.576 kg)  05/25/14 160 lb (72.576 kg)  03/08/14 165 lb (74.844 kg)  02/15/14 160 lb (72.576 kg)  01/13/14 160 lb (72.576 kg)  06/11/13 170 lb (77.111 kg)  04/12/13 165 lb (74.844 kg)    BMI:  Body mass index is 28.53 kg/(m^2). Pt meets criteria for overweight based on current BMI.  Estimated Nutritional Needs: Kcal: 25-30 kcal/kg Protein: > 1 gram protein/kg Fluid: 1 ml/kcal  Diet Order: Diet Heart Room service appropriate?: Yes; Fluid consistency:: Thin Pt is also offered choice of unit snacks mid-morning and mid-afternoon.  Pt is eating as desired.   Lab results and medications reviewed.      Jarome Matin, RD, LDN Inpatient Clinical Dietitian Pager # [phone] After hours/weekend pager # [phone]

## 2015-04-07 NOTE — [facility] Group Notes (Signed)
[place] LCSW Group Therapy 04/07/2015 1:15 PM  Type of Therapy: Group Therapy- Feelings about Diagnosis  Participation Level: Active   Participation Quality:  Appropriate  Affect:  Appropriate  Cognitive: Alert and Oriented   Insight:  Developing   Engagement in Therapy: Developing/Improving and Engaged   Modes of Intervention: Clarification, Confrontation, Discussion, Education, Exploration, Limit-setting, Orientation, Problem-solving, Rapport Building, Art therapist, Socialization and Support  Description of Group:   This group will allow patients to explore their thoughts and feelings about diagnoses they have received. Patients will be guided to explore their level of understanding and acceptance of these diagnoses. Facilitator will encourage patients to process their thoughts and feelings about the reactions of others to their diagnosis, and will guide patients in identifying ways to discuss their diagnosis with significant others in their lives. This group will be process-oriented, with patients participating in exploration of their own experiences as well as giving and receiving support and challenge from other group members.  Summary of Progress/Problems:  Pt was reserved for the majority of group but became more active at the end. Pt was focused on how the stigma surrounding mental illness makes her life and others' lives very difficult. Pt challenged other peers as well but her participation was appropriate and was receptive to redirection.   Therapeutic Modalities:   Cognitive Behavioral Therapy Solution Focused Therapy Motivational Interviewing Relapse Prevention Therapy  [name], LCSWA 04/07/2015 3:53 PM

## 2015-04-07 NOTE — Plan of Care (Signed)
Problem: Alteration in mood Goal: LTG-Pt's behavior demonstrates decreased signs of depression (Patient's behavior demonstrates decreased signs of depression to the point the patient is safe to return home and continue treatment in an outpatient setting)  Outcome: Progressing Patient expresses desire to be "optimistic" today.

## 2015-04-07 NOTE — Plan of Care (Signed)
Problem: Diagnosis: Increased Risk For Suicide Attempt Goal: LTG-Patient Will Report Improved Mood and Deny Suicidal LTG (by discharge) Patient will report improved mood and deny suicidal ideation.  Outcome: Progressing Patient reports her mood is improved today and denies any suicidal ideation currently.

## 2015-04-07 NOTE — BHH Suicide Risk Assessment (Signed)
[place] INPATIENT:  Family/Significant Other Suicide Prevention Education  Suicide Prevention Education:  Patient Refusal for Family/Significant Other Suicide Prevention Education: The patient Peggy Austin has refused to provide written consent for family/significant other to be provided Family/Significant Other Suicide Prevention Education during admission and/or prior to discharge.  Physician notified. SPE reviewed with patient and brochure provided. Patient encouraged to return to hospital if having suicidal thoughts, patient verbalized his/her understanding and has no further questions at this time.   Bo Mcclintock 04/07/2015, 12:27 PM

## 2015-04-07 NOTE — Progress Notes (Signed)
D:  Patient's self inventory reveals that she slept well last night after taking sleep medication.  Her appetite is fair, her energy level is low and her concentration today is poor.  Patient rates her depression an "8" and her anxiety a "7"  Patient was calm and pleasant on the unit this shift.  Patient attended and participated in group today.  Patient expresses that today she wants to work on "optimism."  Patient states she has some pain in her back and stomach and would like to get her medications back on track.  Patient denies suicidal ideation, homicidal ideation, auditory or visual hallucinations at the current time. A:  Scheduled medications were administered to patient per MD orders.  Emotional support and encouragement were provided.  Patient was maintained on q.15 minute safety checks.  Patient was informed to notify staff with any questions or concerns. R:  No adverse medication reactions were noted this shift.  Patient was cooperative with medication administration and treatment plan today.  Patient is receptive, calm and cooperative at this time.  Patient interacts well with others on the unit.  Patient contracts for safety on the unit at this time.  Patient remains safe at this time.

## 2015-04-08 LAB — VITAMIN B12: VITAMIN B 12: 262 pg/mL (ref 180–914)

## 2015-04-08 MED ORDER — LIDOCAINE 5 % EX PTCH
1.0000 | MEDICATED_PATCH | CUTANEOUS | Status: DC
  Administered 2015-04-08 – 2015-04-12 (×5): 1 via TRANSDERMAL
  Filled 2015-04-08 (×7): qty 1

## 2015-04-08 MED ORDER — DULOXETINE HCL 20 MG PO CPEP
40.0000 mg | ORAL_CAPSULE | Freq: Every day | ORAL | Status: DC
  Administered 2015-04-09: 40 mg via ORAL
  Filled 2015-04-08 (×3): qty 2

## 2015-04-08 MED ORDER — GABAPENTIN 300 MG PO CAPS
600.0000 mg | ORAL_CAPSULE | Freq: Two times a day (BID) | ORAL | Status: DC
  Administered 2015-04-08 – 2015-04-09 (×2): 600 mg via ORAL
  Filled 2015-04-08 (×6): qty 2

## 2015-04-08 MED ORDER — GABAPENTIN 300 MG PO CAPS
300.0000 mg | ORAL_CAPSULE | Freq: Every day | ORAL | Status: DC
  Administered 2015-04-08: 300 mg via ORAL
  Filled 2015-04-08 (×3): qty 1

## 2015-04-08 NOTE — BHH Group Notes (Signed)
[facility] LCSW Aftercare Discharge Planning Group Note  04/08/2015 8:45 AM  Participation Quality: Alert, Appropriate and Oriented  Mood/Affect: Flat  Depression Rating: 7  Anxiety Rating: 7  Thoughts of Suicide: Pt denies SI/HI  Will you contract for safety? Yes  Current AVH: Pt denies  Plan for Discharge/Comments: Pt attended discharge planning group and actively participated in group. CSW discussed suicide prevention education with the group and encouraged them to discuss discharge planning and any relevant barriers. Pt reports that she is miserable because of her pain and that the MD had taken away her pain medication. She reports that her depression and anxiety is due to her pain levels.  Transportation Means: Pt reports access to transportation  Supports: No supports mentioned at this time  [name], [place] 04/08/2015 10:11 AM

## 2015-04-08 NOTE — BHH Group Notes (Signed)
[facility] LCSW Group Therapy 04/08/2015 1:15 PM  Type of Therapy: Group Therapy- Emotion Regulation  Participation Level: Active   Participation Quality:  Appropriate  Affect: Appropriate  Cognitive: Alert and Oriented   Insight:  Developing/Improving  Engagement in Therapy: Developing/Improving and Engaged   Modes of Intervention: Clarification, Confrontation, Discussion, Education, Exploration, Limit-setting, Orientation, Problem-solving, Rapport Building, Art therapist, Socialization and Support  Summary of Progress/Problems: The topic for group today was emotional regulation. This group focused on both positive and negative emotion identification and allowed group members to process ways to identify feelings, regulate negative emotions, and find healthy ways to manage internal/external emotions. Group members were asked to reflect on a time when their reaction to an emotion led to a negative outcome and explored how alternative responses using emotion regulation would have benefited them. Group members were also asked to discuss a time when emotion regulation was utilized when a negative emotion was experienced. Pt was observed to be more active in group discussion today. Pt demonstrates developing insight AEB her ability to discuss the tension between being entitled to one's emotions while also recognizing that her emotions are not always accurate or appropriate to the situation. She expressed a need to use trusted people in her life to help her keep her emotions regulated.     [name], [place] 04/08/2015 4:06 PM

## 2015-04-08 NOTE — Progress Notes (Signed)
[facility] MD Progress Note  04/08/2015 1:28 PM Peggy Austin  MRN:  [id number] Subjective:   Patient reports she is feeling " a little better". She states she is still depressed, but feeling better . Denies medication side effects and does not currently endorse any symptoms suggestive of residual serotonin syndrome. She reports ongoing ( chronic ) pain, only partially alleviated by her current medications . Describes dry mouth as medication side effects , but does not endorse any other medication side effects. Objective : Case discussed with treatment team and patient seen. Patient reports partial improvement of mood , but still reports some depression, anxiety, which she attributes partially to her chronic pain. She does present improved, with an improved range of affect.  She has been visible in day room, going to groups, no behavioral issues on unit . She is tolerating medications well at this time. Vitals are stable , no symptoms of serotonin syndrome . Vitamin B 12 within normal. Principal Problem:  MDD  Diagnosis:   Patient Active Problem List   Diagnosis Date Noted  . Severe major depression without psychotic features ([place]) [F32.2] 04/06/2015  . Major depressive disorder, recurrent episode, severe ([place]) [F33.2] 04/06/2015  . Medication side effect [T88.7XXA] 03/26/2015  . PREGNANCY-INDUCED HYPERTENSION [IMO0002] 06/28/2010  . GESTATIONAL DIABETES [O99.810] 06/28/2010  . DYSPNEA ON EXERTION [R06.09, R09.89] 06/28/2010  . OBESITY [E66.9] 09/14/2009  . CONSTIPATION [K59.00] 09/14/2009  . NAUSEA AND VOMITING [R11.2] 09/14/2009  . NAUSEA ALONE [R11.0] 09/14/2009  . FLATULENCE [R14.3, R14.1, R14.2] 09/14/2009  . ABDOMINAL PAIN, GENERALIZED [R10.84] 09/14/2009  . INTESTINAL MALABSORPTION, POSTSURGICAL [K91.2] 06/10/2008  . SKIN LESIONS, MULTIPLE [L98.9] 06/10/2008  . PRIOR LAPAROSCOPIC ROUX-EN-BARIATRIC SURGERY [Z98.84] 03/17/2008  . ANEMIA-UNSPECIFIED [D64.9] 02/13/2008  . IRRITABLE  BOWEL SYNDROME [K58.9] 02/13/2008  . ABDOMINAL PAIN-MULTIPLE SITES [R10.9] 02/13/2008   Total Time spent with patient: 25 minutes     Past Medical History:  Past Medical History  Diagnosis Date  . Hernia   . IBS (irritable bowel syndrome)   . Ulcer   . Thyroid disease   . PIH (pregnancy induced hypertension)   . Asthma   . Gastroparesis   . Pancreatitis   . Partial bowel obstruction ([place])   . GERD (gastroesophageal reflux disease)   . DDD (degenerative disc disease), cervical   . Recurrent oral ulcers   . Lumbar herniated disc     Past Surgical History  Procedure Laterality Date  . Gastric bypass    . Cholecystectomy    . Tubal ligation    . Endometrial ablation    . Hernia repair    . Back surgery    . Cesarean section     Family History: History reviewed. No pertinent family history.  Social History:  History  Alcohol Use No    Comment:       History  Drug Use No    Social History   Social History  . Marital Status: Divorced    Spouse Name: N/A  . Number of Children: N/A  . Years of Education: N/A   Social History Main Topics  . Smoking status: Never Smoker   . Smokeless tobacco: Never Used  . Alcohol Use: No     Comment:    . Drug Use: No  . Sexual Activity: Not Asked   Other Topics Concern  . None   Social History Narrative   Additional Social History:   Sleep: Fair  Appetite:  Good  Current Medications: Current Facility-Administered Medications  Medication  Dose Route Frequency Provider Last Rate Last Dose  . acetaminophen (TYLENOL) tablet 650 mg  650 mg Oral Q6H PRN Patrecia Pour, NP   650 mg at 04/08/15 5732  . albuterol (PROVENTIL) (2.5 MG/3ML) 0.083% nebulizer solution 3 mL  3 mL Inhalation Q6H PRN Patrecia Pour, NP      . alum & mag hydroxide-simeth (MAALOX/MYLANTA) 200-200-20 MG/5ML suspension 30 mL  30 mL Oral Q4H PRN Patrecia Pour, NP      . clonazePAM (KLONOPIN) tablet 0.5 mg  0.5 mg Oral q morning - 10a Myer Peer Cobos, MD    0.5 mg at 04/08/15 1023  . clonazePAM (KLONOPIN) tablet 1 mg  1 mg Oral QHS Jenne Campus, MD   1 mg at 04/07/15 2245  . [START ON 04/09/2015] DULoxetine (CYMBALTA) DR capsule 40 mg  40 mg Oral Daily Fernando A Cobos, MD      . feeding supplement (ENSURE ENLIVE) (ENSURE ENLIVE) liquid 237 mL  237 mL Oral BID BM Myer Peer Cobos, MD   237 mL at 04/08/15 1023  . fluticasone (FLONASE) 50 MCG/ACT nasal spray 2 spray  2 spray Each Nare BID Patrecia Pour, NP   2 spray at 04/08/15 [phone]  . gabapentin (NEURONTIN) capsule 600 mg  600 mg Oral BID Myer Peer Cobos, MD      . ibuprofen (ADVIL,MOTRIN) tablet 600 mg  600 mg Oral Q6H PRN Laverle Hobby, PA-C      . levothyroxine (SYNTHROID, LEVOTHROID) tablet 50 mcg  50 mcg Oral QAC breakfast Patrecia Pour, NP   50 mcg at 04/08/15 4270  . lidocaine (LIDODERM) 5 % 1 patch  1 patch Transdermal Q24H Jenne Campus, MD      . Linaclotide [facility]) capsule 145 mcg  145 mcg Oral Daily PRN Patrecia Pour, NP      . loratadine (CLARITIN) tablet 10 mg  10 mg Oral Daily Patrecia Pour, NP   10 mg at 04/08/15 6237  . magnesium hydroxide (MILK OF MAGNESIA) suspension 30 mL  30 mL Oral Daily PRN Patrecia Pour, NP      . ondansetron (ZOFRAN-ODT) disintegrating tablet 4 mg  4 mg Oral Q8H PRN Laverle Hobby, PA-C   4 mg at 04/08/15 6283  . pantoprazole (PROTONIX) EC tablet 20 mg  20 mg Oral Daily Patrecia Pour, NP   20 mg at 04/08/15 1517    Lab Results:  Results for orders placed or performed during the hospital encounter of 04/06/15 (from the past 48 hour(s))  Vitamin B12     Status: None   Collection Time: 04/08/15  6:39 AM  Result Value Ref Range   Vitamin B-12 262 180 - 914 pg/mL    Comment: (NOTE) This assay is not validated for testing neonatal or myeloproliferative syndrome specimens for Vitamin B12 levels. Performed at [facility]     Physical Findings: AIMS: Facial and Oral Movements Muscles of Facial Expression: None, normal Lips and  Perioral Area: None, normal Jaw: None, normal Tongue: None, normal,Extremity Movements Upper (arms, wrists, hands, fingers): None, normal, Trunk Movements Neck, shoulders, hips: None, normal, Overall Severity Severity of abnormal movements (highest score from questions above): None, normal Patient's awareness of abnormal movements (rate only patient's report): No Awareness, Dental Status Current problems with teeth and/or dentures?: Yes Does patient usually wear dentures?: Yes  CIWA:    COWS:     Musculoskeletal: Strength & Muscle Tone: within normal limits Gait &  Station: normal Patient leans: N/A  Psychiatric Specialty Exam: ROSdescribes chronic back pain, often radiating to leg. No vomiting, no rash  Blood pressure 93/63, pulse 93, temperature 97.7 F (36.5 C), temperature source Oral, resp. rate 16, height 5\' 2"  (1.575 m), weight 156 lb (70.761 kg).Body mass index is 28.53 kg/(m^2).  General Appearance: improved grooming   Eye Contact::  Good  Speech:  Normal Rate  Volume:  Normal  Mood:  improved, but still feeling depressed   Affect:  Constricted and but more reactive   Thought Process:  Goal Directed and Linear  Orientation:  Full (Time, Place, and Person)  Thought Content:  denies hallucinations, no delusions, not internally preoccupied   Suicidal Thoughts:  No denies suicidal ideations at this time, and contracts for safety on the unit   Homicidal Thoughts:  No  Memory:  recent and remote grossly intact   Judgement:   improved  Insight:  Present  Psychomotor Activity:  Normal- does not appear agitated or in acute distress   Concentration:  Good  Recall:  Good  Fund of Knowledge:Good  Language: Good  Akathisia:  Negative  Handed:  Right  AIMS (if indicated):     Assets:  Communication Skills Desire for Improvement Resilience  ADL's:   Improved   Cognition: WNL  Sleep:  Number of Hours: 5.75  Assessment - patient presents with partial improvement- still feels  depressed, but states she is feeling better. Affect presents as more reactive . She remains focused on chronic pain issues , but appears comfortable and in no acute distress at this time. She  does report Neurontin is "starting to help "  and is amenable to further titration.  She is tolerating Cymbalta well at this time. It had been started at low dose due to recent history of possible serotonin syndrome. No side effects at this time.  Treatment Plan Summary: Daily contact with patient to assess and evaluate symptoms and progress in treatment, Medication management, Plan inpatient treatment and medications as below  Increase Cymbalta to 40 mgrs QDAY for depression and anxiety- may also help chronic pain Increase Neurontin 600 mgrs BID for management of pain and anxiety Continue Klonopin 0.5 mgrs QAM and 1 mgr QHS for anxiety Start Lidoderm patch for management of pain Encourage group , milieu participation to work on coping skills and symptom reduction. COBOS, [place] 04/08/2015, 1:28 PM

## 2015-04-08 NOTE — Progress Notes (Signed)
D: Patient in the dayroom on approach.  Patient states her medications were changed today and states she thinks it will help.  Patient states her goal for today was to get her pain under control.  Patient states her pain is better than it has been. Patient denies SI/HI and denies AVH.   A: Staff to monitor Q 15 mins for safety.  Encouragement and support offered.  Scheduled medications administered per orders. R: Patient remains safe on the unit.  Patient attended group tonight.  Patient visible on the unit and interacting with peers.  Patient with peers

## 2015-04-08 NOTE — Progress Notes (Signed)
D: Pt presented with flat affect and depressed mood. Pt rated depression 6/10 and stated that it's because of pain. Rated anxiety 7/10. Pt reported poor sleep last night d/t pain. Pt reported that she's constantly in pain from her lumbar down to her leg. Pt stated that her biggest concern today is her pain. Cobos, MD., made aware of pt complaints.  A: Medications reviewed with pt. Medications administered as ordered per MD. Verbal support given. Pt encouraged to attend groups. 15 minute checks performed for safety.  R: Pt safety maintained. Pt receptive to tx.

## 2015-04-09 LAB — TSH: TSH: 0.107 u[IU]/mL — AB (ref 0.350–4.500)

## 2015-04-09 LAB — GLUCOSE, CAPILLARY
GLUCOSE-CAPILLARY: 106 mg/dL — AB (ref 65–99)
GLUCOSE-CAPILLARY: 55 mg/dL — AB (ref 65–99)

## 2015-04-09 MED ORDER — GABAPENTIN 300 MG PO CAPS
300.0000 mg | ORAL_CAPSULE | Freq: Once | ORAL | Status: AC
  Administered 2015-04-09: 300 mg via ORAL
  Filled 2015-04-09: qty 1

## 2015-04-09 MED ORDER — DULOXETINE HCL 60 MG PO CPEP
60.0000 mg | ORAL_CAPSULE | Freq: Every day | ORAL | Status: DC
  Administered 2015-04-10 – 2015-04-12 (×3): 60 mg via ORAL
  Filled 2015-04-09 (×5): qty 1

## 2015-04-09 MED ORDER — GABAPENTIN 300 MG PO CAPS
900.0000 mg | ORAL_CAPSULE | Freq: Every day | ORAL | Status: DC
  Administered 2015-04-10 – 2015-04-11 (×2): 900 mg via ORAL
  Filled 2015-04-09 (×5): qty 3

## 2015-04-09 MED ORDER — GABAPENTIN 300 MG PO CAPS
300.0000 mg | ORAL_CAPSULE | Freq: Every morning | ORAL | Status: DC
  Administered 2015-04-10: 300 mg via ORAL
  Filled 2015-04-09 (×3): qty 1

## 2015-04-09 MED ORDER — LOPERAMIDE HCL 2 MG PO CAPS
2.0000 mg | ORAL_CAPSULE | Freq: Once | ORAL | Status: AC
  Administered 2015-04-09: 2 mg via ORAL
  Filled 2015-04-09 (×2): qty 1

## 2015-04-09 MED ORDER — TRAMADOL HCL 50 MG PO TABS: 25.0000 mg | ORAL_TABLET | Freq: Two times a day (BID) | ORAL | Status: DC | PRN

## 2015-04-09 MED ORDER — GABAPENTIN 300 MG PO CAPS: 600.0000 mg | ORAL_CAPSULE | Freq: Every day | ORAL | Status: DC

## 2015-04-09 NOTE — Progress Notes (Signed)
D: Patient in the hallway on approach.  Patient states she had a good day and was able to talk to the doctor.  Patient states her goal for today was to get her pain under control.  Patient states her goal was met but then Dr. Parke Poisson had to change her medications due to prior medical problems prior to admission.  Patient did come up to [facility] and states she felt her blood sugar was dropping due to having gastric bypass.  Patient blood sugar taken and it was 80.  Patient was given protein peanut butter at bedside.  Patient states she feels fine and states she will come to staff if she has anymore issues with her blood sugar dropping or if symptomatic. Patient denies SI/HI and AVH A: Staff to monitor Q 15 mins for safety.  Encouragement and support offered.  Scheduled medications administered per orders. Tylenol administered prn for pain. R: Patient remains safe on the unit.  Patient attended group tonight.  Patient visible on the unit and interacting with peers.  Patient taking administered medications.

## 2015-04-09 NOTE — Progress Notes (Signed)
Hypoglycemic Event  CBG: 55 at 1855  Treatment: 15 GM carbohydrate snack  Symptoms: Sweaty, Shaky and Hungry  Follow-up CBG: Time 1910 CBG Result: 106  Possible Reasons for Event: Inadequate meal intake   Comments/MD notified: Dr. Parke Poisson made aware at 1715. Continue to monitor pt for s/s of hypoglycemia and tx.    Peggy Austin L

## 2015-04-09 NOTE — Progress Notes (Signed)
Patient ID: Peggy Austin, female   DOB: 04/12/1971, 44 y.o.   MRN: [id number] [facility] MD Progress Note  04/09/2015 6:07 PM Peggy Austin  MRN:  [id number] Subjective:   Patient acknowledges overall improvement , but  States she still remains vaguely apprehensive, anxious, particularly regarding her concerns about insomnia. States she has suffered from insomnia for years, and has developed  Secondary anxiety due to her tendency to ruminate she is not going to be able to sleep well and will therefore feel worse the next day. Also continues to report chronic pain. Denies medication side effects. Objective : Case discussed with treatment team and patient seen. Patient presents improved compared to admission status- less depressed, more reactive affect, improved grooming. As above, her major concerns Gale Journey continue to be insomnia (states sleep is partially improved but still wakes up often) and chronic pain. ( Reports Neurontin/Lidoderm patch partially effective in addressing pain at this time_  She has been visible in day room, going to groups, no behavioral issues on unit . Noted to be interacting well  With peers, sociable. No medication side effects. Vitals are stable , and does not endorse or present with  Any symptoms  suggestive of serotonin syndrome  At this time.  Principal Problem:  MDD  Diagnosis:   Patient Active Problem List   Diagnosis Date Noted  . Severe major depression without psychotic features ([place]) [F32.2] 04/06/2015  . Major depressive disorder, recurrent episode, severe ([place]) [F33.2] 04/06/2015  . Medication side effect [T88.7XXA] 03/26/2015  . PREGNANCY-INDUCED HYPERTENSION [IMO0002] 06/28/2010  . GESTATIONAL DIABETES [O99.810] 06/28/2010  . DYSPNEA ON EXERTION [R06.09, R09.89] 06/28/2010  . OBESITY [E66.9] 09/14/2009  . CONSTIPATION [K59.00] 09/14/2009  . NAUSEA AND VOMITING [R11.2] 09/14/2009  . NAUSEA ALONE [R11.0] 09/14/2009  . FLATULENCE [R14.3, R14.1, R14.2]  09/14/2009  . ABDOMINAL PAIN, GENERALIZED [R10.84] 09/14/2009  . INTESTINAL MALABSORPTION, POSTSURGICAL [K91.2] 06/10/2008  . SKIN LESIONS, MULTIPLE [L98.9] 06/10/2008  . PRIOR LAPAROSCOPIC ROUX-EN-BARIATRIC SURGERY [Z98.84] 03/17/2008  . ANEMIA-UNSPECIFIED [D64.9] 02/13/2008  . IRRITABLE BOWEL SYNDROME [K58.9] 02/13/2008  . ABDOMINAL PAIN-MULTIPLE SITES [R10.9] 02/13/2008   Total Time spent with patient: 25 minutes     Past Medical History:  Past Medical History  Diagnosis Date  . Hernia   . IBS (irritable bowel syndrome)   . Ulcer   . Thyroid disease   . PIH (pregnancy induced hypertension)   . Asthma   . Gastroparesis   . Pancreatitis   . Partial bowel obstruction ([place])   . GERD (gastroesophageal reflux disease)   . DDD (degenerative disc disease), cervical   . Recurrent oral ulcers   . Lumbar herniated disc     Past Surgical History  Procedure Laterality Date  . Gastric bypass    . Cholecystectomy    . Tubal ligation    . Endometrial ablation    . Hernia repair    . Back surgery    . Cesarean section     Family History: History reviewed. No pertinent family history.  Social History:  History  Alcohol Use No    Comment:       History  Drug Use No    Social History   Social History  . Marital Status: Divorced    Spouse Name: N/A  . Number of Children: N/A  . Years of Education: N/A   Social History Main Topics  . Smoking status: Never Smoker   . Smokeless tobacco: Never Used  . Alcohol Use: No  Comment:    . Drug Use: No  . Sexual Activity: Not Asked   Other Topics Concern  . None   Social History Narrative   Additional Social History:   Sleep: Fair  Appetite:  Good  Current Medications: Current Facility-Administered Medications  Medication Dose Route Frequency Provider Last Rate Last Dose  . acetaminophen (TYLENOL) tablet 650 mg  650 mg Oral Q6H PRN Patrecia Pour, NP   650 mg at 04/09/15 1342  . albuterol (PROVENTIL) (2.5 MG/3ML)  0.083% nebulizer solution 3 mL  3 mL Inhalation Q6H PRN Patrecia Pour, NP      . alum & mag hydroxide-simeth (MAALOX/MYLANTA) 200-200-20 MG/5ML suspension 30 mL  30 mL Oral Q4H PRN Patrecia Pour, NP      . clonazePAM (KLONOPIN) tablet 0.5 mg  0.5 mg Oral q morning - 10a Myer Peer Helene Bernstein, MD   0.5 mg at 04/09/15 1019  . clonazePAM (KLONOPIN) tablet 1 mg  1 mg Oral QHS Jenne Campus, MD   1 mg at 04/08/15 2231  . [START ON 04/10/2015] DULoxetine (CYMBALTA) DR capsule 60 mg  60 mg Oral Daily [place] Droll A Gwenetta Devos, MD      . feeding supplement (ENSURE ENLIVE) (ENSURE ENLIVE) liquid 237 mL  237 mL Oral BID BM Myer Peer Mekiah Wahler, MD   237 mL at 04/09/15 1500  . fluticasone (FLONASE) 50 MCG/ACT nasal spray 2 spray  2 spray Each Nare BID Patrecia Pour, NP   2 spray at 04/08/15 1707  . [START ON 04/10/2015] gabapentin (NEURONTIN) capsule 300 mg  300 mg Oral q morning - 10a Jenne Campus, MD      . Derrill Memo ON 04/10/2015] gabapentin (NEURONTIN) capsule 900 mg  900 mg Oral QHS Frederika Hukill A Tsugio Elison, MD      . ibuprofen (ADVIL,MOTRIN) tablet 600 mg  600 mg Oral Q6H PRN Laverle Hobby, PA-C      . levothyroxine (SYNTHROID, LEVOTHROID) tablet 50 mcg  50 mcg Oral QAC breakfast Patrecia Pour, NP   50 mcg at 04/09/15 1610  . lidocaine (LIDODERM) 5 % 1 patch  1 patch Transdermal Q24H Jenne Campus, MD   1 patch at 04/09/15 1307  . Linaclotide (LINZESS) capsule 145 mcg  145 mcg Oral Daily PRN Patrecia Pour, NP   145 mcg at 04/09/15 0847  . loratadine (CLARITIN) tablet 10 mg  10 mg Oral Daily Patrecia Pour, NP   10 mg at 04/09/15 0802  . magnesium hydroxide (MILK OF MAGNESIA) suspension 30 mL  30 mL Oral Daily PRN Patrecia Pour, NP      . ondansetron (ZOFRAN-ODT) disintegrating tablet 4 mg  4 mg Oral Q8H PRN Laverle Hobby, PA-C   4 mg at 04/08/15 9604  . pantoprazole (PROTONIX) EC tablet 20 mg  20 mg Oral Daily Patrecia Pour, NP   20 mg at 04/09/15 0802  . traMADol (ULTRAM) tablet 25 mg  25 mg Oral Q12H PRN Jenne Campus, MD        Lab Results:  Results for orders placed or performed during the hospital encounter of 04/06/15 (from the past 48 hour(s))  Vitamin B12     Status: None   Collection Time: 04/08/15  6:39 AM  Result Value Ref Range   Vitamin B-12 262 180 - 914 pg/mL    Comment: (NOTE) This assay is not validated for testing neonatal or myeloproliferative syndrome specimens for Vitamin B12 levels. Performed at [facility]  Hospital     Physical Findings: AIMS: Facial and Oral Movements Muscles of Facial Expression: None, normal Lips and Perioral Area: None, normal Jaw: None, normal Tongue: None, normal,Extremity Movements Upper (arms, wrists, hands, fingers): None, normal, Trunk Movements Neck, shoulders, hips: None, normal, Overall Severity Severity of abnormal movements (highest score from questions above): None, normal Patient's awareness of abnormal movements (rate only patient's report): No Awareness, Dental Status Current problems with teeth and/or dentures?: Yes Does patient usually wear dentures?: Yes  CIWA:    COWS:     Musculoskeletal: Strength & Muscle Tone: within normal limits Gait & Station: normal Patient leans: N/A  Psychiatric Specialty Exam: ROSdescribes chronic back pain, often radiating to leg. No vomiting, no rash  Blood pressure 111/77, pulse 87, temperature 97.8 F (36.6 C), temperature source Oral, resp. rate 16, height 5\' 2"  (1.575 m), weight 156 lb (70.761 kg).Body mass index is 28.53 kg/(m^2).  General Appearance: improved grooming   Eye Contact::  Good  Speech:  Normal Rate  Volume:  Normal  Mood:   Gradually improving   Affect:   Brighter, more reactive   Thought Process:  Goal Directed and Linear  Orientation:  Full (Time, Place, and Person)  Thought Content:  denies hallucinations, no delusions, not internally preoccupied   Suicidal Thoughts:  No denies suicidal ideations at this time, and contracts for safety on the unit   Homicidal  Thoughts:  No  Memory:  recent and remote grossly intact   Judgement:   improved  Insight:  Present  Psychomotor Activity:  Normal- does not appear agitated or in acute distress   Concentration:  Good  Recall:  Good  Fund of Knowledge:Good  Language: Good  Akathisia:  Negative  Handed:  Right  AIMS (if indicated):     Assets:  Communication Skills Desire for Improvement Resilience  ADL's:   Improved   Cognition: WNL  Sleep:  Number of Hours: 5.75  Assessment - patient presents with  Improving mood and affect. Major concerns for patient continue to be insomnia, which is chronic, currently partially improved on current meds, and chronic pain, also improving on Neurontin, Cymbalta, Lidoderm patch.  No current symptoms of serotonin syndrome. No side effects at this time.  Treatment Plan Summary: Daily contact with patient to assess and evaluate symptoms and progress in treatment, Medication management, Plan inpatient treatment and medications as below  Increase Cymbalta to 60  mgrs QDAY for depression and anxiety- may also help chronic pain Increase Neurontin 600 mgrs QAM and 900 mgrs QHS  for management of pain and anxiety- increased QHS dose may also help insomnia. Continue Klonopin 0.5 mgrs QAM and 1 mgr QHS for anxiety Continue Lidoderm patch for management of pain Encourage group , milieu participation to work on coping skills and symptom reduction. Linken Mcglothen 04/09/2015, 6:07 PM

## 2015-04-09 NOTE — Progress Notes (Signed)
Adult Psychoeducational Group Note  Date:  04/09/2015 Time:  0900  Group Topic/Focus:  Orientation:   The focus of this group is to educate the patient on the purpose and policies of crisis stabilization and provide a format to answer questions about their admission.  The group details unit policies and expectations of patients while admitted.  Participation Level:  Active  Participation Quality:  Appropriate  Affect:  Appropriate  Cognitive:  Appropriate  Insight: Appropriate  Engagement in Group:  Engaged  Modes of Intervention:  Orientation  Additional Comments:    Serge Main L 04/09/2015, 12:52 PM

## 2015-04-09 NOTE — Progress Notes (Signed)
D: Pt presents with flat affect and depressed mood. Pt reported decreasing depression 3/10. Anxiety 5/10. Pt reported that she continues to worry about her sleep issues and whether or not she have solved the issue. Pt reported that she was able to sleep last night d/t taking Klonopin and Neurontin but continues to wake up throughout the night. Pt reported that she's not sure if her meds are right. Pt c/o of ongoing pain this morning and reported that the Lidoderm patch was effective in reducing her pain but concerned about what to do when the patch is taken off for twelve hours. Writer explained to pt that she have Neurontin scheduled throughout the day to help control her pain. Writer informed Dr. Parke Poisson of pt complaint.  A: Medications administered as ordered per MD. Verbal support given. Pt encouraged to attend groups. 15 minute checks performed for safety. R: Pt receptive to tx.

## 2015-04-09 NOTE — Plan of Care (Signed)
Problem: Ineffective individual coping Goal: STG: Patient will remain free from self harm Outcome: Progressing Pt remains free from self harm. Pt denies suicidal thoughts. Pt verbally contracts not to harm self.

## 2015-04-10 LAB — GLUCOSE, CAPILLARY: Glucose-Capillary: 80 mg/dL (ref 65–99)

## 2015-04-10 MED ORDER — OXYCODONE-ACETAMINOPHEN 5-325 MG PO TABS
ORAL_TABLET | ORAL | Status: AC
  Administered 2015-04-10: 1
  Filled 2015-04-10: qty 1

## 2015-04-10 MED ORDER — ZOLPIDEM TARTRATE 10 MG PO TABS
10.0000 mg | ORAL_TABLET | Freq: Once | ORAL | Status: AC
  Administered 2015-04-10: 10 mg via ORAL
  Filled 2015-04-10: qty 1

## 2015-04-10 MED ORDER — OXYCODONE HCL 5 MG PO TABS
5.0000 mg | ORAL_TABLET | Freq: Two times a day (BID) | ORAL | Status: DC | PRN
  Administered 2015-04-10 – 2015-04-11 (×2): 5 mg via ORAL
  Filled 2015-04-10 (×2): qty 1

## 2015-04-10 MED ORDER — OXYCODONE-ACETAMINOPHEN 5-325 MG PO TABS
1.0000 | ORAL_TABLET | Freq: Once | ORAL | Status: AC
  Administered 2015-04-10: 1 via ORAL

## 2015-04-10 MED ORDER — GABAPENTIN 300 MG PO CAPS
300.0000 mg | ORAL_CAPSULE | Freq: Two times a day (BID) | ORAL | Status: DC
  Administered 2015-04-11: 300 mg via ORAL
  Filled 2015-04-10 (×4): qty 1

## 2015-04-10 NOTE — Progress Notes (Signed)
D-pt sts she had a good night and morning with slight anxiety.  Pt c/o severe back pain, enjoys lidocaine patch but requesting a narcotic A-pt took her am medications and met with dr. cobos R-cont to monitor for safety 

## 2015-04-10 NOTE — [facility] Group Notes (Signed)
[facility] LCSW Group Therapy 04/10/2015 1:15pm  Type of Therapy: Group Therapy- Feelings Around Relapse and Recovery  Pt did not attend, declined invitation.   Peggy Austin, Peggy Austin 04/10/2015 5:07 PM

## 2015-04-10 NOTE — [facility] Group Notes (Signed)
[facility] LCSW Aftercare Discharge Planning Group Note  04/10/2015 8:45 AM  Pt did not attend, declined invitation.   [name], [place] 04/10/2015 5:07 PM

## 2015-04-10 NOTE — Progress Notes (Signed)
Adult Psychoeducational Group Note  Date:  04/10/2015 Time:  10:29 PM  Group Topic/Focus:  Wrap-Up Group:   The focus of this group is to help patients review their daily goal of treatment and discuss progress on daily workbooks.  Participation Level:  Active  Participation Quality:  Appropriate  Affect:  Appropriate  Cognitive:  Alert and Appropriate  Insight: Appropriate and Good  Engagement in Group:  Engaged  Modes of Intervention:  Support  Additional Comments:  Patient rated her day a 5. Goal is manage anxiety better and try to get balanced out on her medication so she can sleep at night.  Gloriajean Dell Jodean Valade 04/10/2015, 10:29 PM

## 2015-04-10 NOTE — Progress Notes (Signed)
  [facility] Adult Case Management Discharge Plan :  Will you be returning to the same living situation after discharge:  Yes,  Pt returning home with family At discharge, do you have transportation home?: Yes,  Pt's father to provide transportation Do you have the ability to pay for your medications: Yes,  Pt provided with prescriptions  Release of information consent forms completed and in the chart;  Patient's signature needed at discharge.  Patient to Follow up at: Follow-up Information    Follow up with [facility] On 04/13/2015.   Why:  at 1:00pm for therapy with Vidal Schwalbe   Contact information:   [address] [place], [place]. [address] [phone] Fax: [phone]      Follow up with [facility] On 04/17/2015.   Why:  at 2:00pm for medication management with Dr. Nicholes Mango information:   [phone] [place], [place]. [address] [phone] Fax: [phone]      Next level of care provider has access to [facility] Link:no  Patient denies SI/HI: Yes,  Pt denies    Safety Planning and Suicide Prevention discussed: Yes,  with Pt; declines family contact  Have you used any form of tobacco in the last 30 days? (Cigarettes, Smokeless Tobacco, Cigars, and/or Pipes): No  Has patient been referred to the Quitline?: N/A patient is not a smoker  Bo Mcclintock 04/10/2015, 5:11 PM

## 2015-04-10 NOTE — Progress Notes (Addendum)
Patient ID: Peggy Austin, female   DOB: 09-27-1970, 44 y.o.   MRN: [id number] [facility] MD Progress Note  04/10/2015 3:55 PM Peggy Austin  MRN:  [id number] Subjective:  At this time feeling better, but this morning had episode of severe pain, superimposed on her chronic pain. She is not sure of what triggered this episode of increased , acute pain, but states she periodically has these episodes . She also reports history of episodes of hypoglycemia since she had her bariatric surgery,which she state usually occur when she consumes sugary beverage too quickly. Overall she is happy with her current medication regimen and she states it seems  That non - narcotic medications, such as Neurontin, Lidoderm patch) are  Helping to alleviate her severe pain and allowing her to sleep better in general. She did, as above, have an episode of severe, acute pain, which improved with opiate  ( oxycodone ) dose . Of note, denies any side effects from this medication. Denies medication side effects. Objective : Case discussed with treatment team and patient seen. This morning patient presented with severe pain, obviously uncomfortable, tearful, having difficulty finding a comfortable position to alleviate pain, restless. She responded well to opiate dose x 1.  She describes, as above, chronic pain and states " and every now and again, particularly if I have been moving around a lot, I get this terrible pain on top of it ". At present improved, and although still reporting painful gait, chronic pain, presents much more comfortable and with a bright affect. She states she has decided not to take Ultram after return home, due to concern it could have contributed to suspected serotonin syndrome, and because she feels it has contributed to other side effects. She is hoping to be able to manage pain with non narcotic strategies. She has been more visible in day room and has gone to some groups . No agitated behaviors on unit  . TSH low - patient on Synthroid. We discussed , patient plans to follow up with PCP for ongoing thyroid management / monitoring   Principal Problem:  MDD  Diagnosis:   Patient Active Problem List   Diagnosis Date Noted  . Severe major depression without psychotic features ([place]) [F32.2] 04/06/2015  . Major depressive disorder, recurrent episode, severe ([place]) [F33.2] 04/06/2015  . Medication side effect [T88.7XXA] 03/26/2015  . PREGNANCY-INDUCED HYPERTENSION [IMO0002] 06/28/2010  . GESTATIONAL DIABETES [O99.810] 06/28/2010  . DYSPNEA ON EXERTION [R06.09, R09.89] 06/28/2010  . OBESITY [E66.9] 09/14/2009  . CONSTIPATION [K59.00] 09/14/2009  . NAUSEA AND VOMITING [R11.2] 09/14/2009  . NAUSEA ALONE [R11.0] 09/14/2009  . FLATULENCE [R14.3, R14.1, R14.2] 09/14/2009  . ABDOMINAL PAIN, GENERALIZED [R10.84] 09/14/2009  . INTESTINAL MALABSORPTION, POSTSURGICAL [K91.2] 06/10/2008  . SKIN LESIONS, MULTIPLE [L98.9] 06/10/2008  . PRIOR LAPAROSCOPIC ROUX-EN-BARIATRIC SURGERY [Z98.84] 03/17/2008  . ANEMIA-UNSPECIFIED [D64.9] 02/13/2008  . IRRITABLE BOWEL SYNDROME [K58.9] 02/13/2008  . ABDOMINAL PAIN-MULTIPLE SITES [R10.9] 02/13/2008   Total Time spent with patient: 25 minutes     Past Medical History:  Past Medical History  Diagnosis Date  . Hernia   . IBS (irritable bowel syndrome)   . Ulcer   . Thyroid disease   . PIH (pregnancy induced hypertension)   . Asthma   . Gastroparesis   . Pancreatitis   . Partial bowel obstruction ([place])   . GERD (gastroesophageal reflux disease)   . DDD (degenerative disc disease), cervical   . Recurrent oral ulcers   . Lumbar herniated disc  Past Surgical History  Procedure Laterality Date  . Gastric bypass    . Cholecystectomy    . Tubal ligation    . Endometrial ablation    . Hernia repair    . Back surgery    . Cesarean section     Family History: History reviewed. No pertinent family history.  Social History:  History  Alcohol Use No     Comment:       History  Drug Use No    Social History   Social History  . Marital Status: Divorced    Spouse Name: N/A  . Number of Children: N/A  . Years of Education: N/A   Social History Main Topics  . Smoking status: Never Smoker   . Smokeless tobacco: Never Used  . Alcohol Use: No     Comment:    . Drug Use: No  . Sexual Activity: Not Asked   Other Topics Concern  . None   Social History Narrative   Additional Social History:   Sleep: Fair  Appetite:  Good  Current Medications: Current Facility-Administered Medications  Medication Dose Route Frequency Provider Last Rate Last Dose  . acetaminophen (TYLENOL) tablet 650 mg  650 mg Oral Q6H PRN Patrecia Pour, NP   650 mg at 04/10/15 0818  . albuterol (PROVENTIL) (2.5 MG/3ML) 0.083% nebulizer solution 3 mL  3 mL Inhalation Q6H PRN Patrecia Pour, NP      . alum & mag hydroxide-simeth (MAALOX/MYLANTA) 200-200-20 MG/5ML suspension 30 mL  30 mL Oral Q4H PRN Patrecia Pour, NP      . clonazePAM Bobbye [place]) tablet 0.5 mg  0.5 mg Oral q morning - 10a Jenne Campus, MD   0.5 mg at 04/10/15 0934  . clonazePAM (KLONOPIN) tablet 1 mg  1 mg Oral QHS Jenne Campus, MD   1 mg at 04/09/15 2219  . DULoxetine (CYMBALTA) DR capsule 60 mg  60 mg Oral Daily Jenne Campus, MD   60 mg at 04/10/15 0816  . feeding supplement (ENSURE ENLIVE) (ENSURE ENLIVE) liquid 237 mL  237 mL Oral BID BM Myer Peer Cobos, MD   237 mL at 04/09/15 1500  . fluticasone (FLONASE) 50 MCG/ACT nasal spray 2 spray  2 spray Each Nare BID Patrecia Pour, NP   2 spray at 04/10/15 0816  . gabapentin (NEURONTIN) capsule 300 mg  300 mg Oral BID Jenne Campus, MD      . gabapentin (NEURONTIN) capsule 900 mg  900 mg Oral QHS Fernando A Cobos, MD      . ibuprofen (ADVIL,MOTRIN) tablet 600 mg  600 mg Oral Q6H PRN Laverle Hobby, PA-C      . levothyroxine (SYNTHROID, LEVOTHROID) tablet 50 mcg  50 mcg Oral QAC breakfast Patrecia Pour, NP   50 mcg at 04/10/15  6945  . lidocaine (LIDODERM) 5 % 1 patch  1 patch Transdermal Q24H Jenne Campus, MD   1 patch at 04/10/15 1328  . Linaclotide (LINZESS) capsule 145 mcg  145 mcg Oral Daily PRN Patrecia Pour, NP   145 mcg at 04/09/15 0847  . loratadine (CLARITIN) tablet 10 mg  10 mg Oral Daily Patrecia Pour, NP   10 mg at 04/10/15 0816  . magnesium hydroxide (MILK OF MAGNESIA) suspension 30 mL  30 mL Oral Daily PRN Patrecia Pour, NP      . ondansetron (ZOFRAN-ODT) disintegrating tablet 4 mg  4 mg Oral Q8H PRN Maurine Minister  Simon, PA-C   4 mg at 04/10/15 0017  . pantoprazole (PROTONIX) EC tablet 20 mg  20 mg Oral Daily Patrecia Pour, NP   20 mg at 04/10/15 4944    Lab Results:  Results for orders placed or performed during the hospital encounter of 04/06/15 (from the past 48 hour(s))  TSH     Status: Abnormal   Collection Time: 04/09/15  6:49 PM  Result Value Ref Range   TSH 0.107 (L) 0.350 - 4.500 uIU/mL    Comment: Performed at [facility]  Glucose, capillary     Status: Abnormal   Collection Time: 04/09/15  6:55 PM  Result Value Ref Range   Glucose-Capillary 55 (L) 65 - 99 mg/dL   Comment 1 Notify RN    Comment 2 Document in Chart   Glucose, capillary     Status: Abnormal   Collection Time: 04/09/15  7:13 PM  Result Value Ref Range   Glucose-Capillary 106 (H) 65 - 99 mg/dL  Glucose, capillary     Status: None   Collection Time: 04/09/15  8:23 PM  Result Value Ref Range   Glucose-Capillary 80 65 - 99 mg/dL    Physical Findings: AIMS: Facial and Oral Movements Muscles of Facial Expression: None, normal Lips and Perioral Area: None, normal Jaw: None, normal Tongue: None, normal,Extremity Movements Upper (arms, wrists, hands, fingers): None, normal, Trunk Movements Neck, shoulders, hips: None, normal, Overall Severity Severity of abnormal movements (highest score from questions above): None, normal Patient's awareness of abnormal movements (rate only patient's report): No  Awareness, Dental Status Current problems with teeth and/or dentures?: Yes Does patient usually wear dentures?: Yes  CIWA:    COWS:     Musculoskeletal: Strength & Muscle Tone: within normal limits Gait & Station: normal Patient leans: N/A  Psychiatric Specialty Exam: ROSdescribes chronic back pain, often radiating to leg.  Limited episode of more severe, acute pain this AM, now resolved .No vomiting, no rash  Blood pressure 123/88, pulse 107, temperature 98.1 F (36.7 C), temperature source Oral, resp. rate 21, height 5\' 2"  (1.575 m), weight 156 lb (70.761 kg).Body mass index is 28.53 kg/(m^2).  General Appearance: improved grooming   Eye Contact::  Good  Speech:  Normal Rate  Volume:  Normal  Mood:   Improved and at this time presents relatively euthymic   Affect:   Reactive, appropriate   Thought Process:  Goal Directed and Linear  Orientation:  Full (Time, Place, and Person)  Thought Content:  denies hallucinations, no delusions, not internally preoccupied   Suicidal Thoughts:  No denies suicidal ideations at this time, and contracts for safety on the unit   Homicidal Thoughts:  No  Memory:  recent and remote grossly intact   Judgement:   improved  Insight:  Present  Psychomotor Activity:  Normal-   Concentration:  Good  Recall:  Good  Fund of Knowledge:Good  Language: Good  Akathisia:  Negative  Handed:  Right  AIMS (if indicated):     Assets:  Communication Skills Desire for Improvement Resilience  ADL's:   Improved   Cognition: WNL  Sleep:  Number of Hours: 4  Assessment - mood and affect improved compared to admission. Tolerating medications well at present and feels Neurontin titration has been well tolerated and partially effective in decreasing pain and improving sleep. This AM had acute , severe episode of increased back pain which caused her to present tearful, uncomfortable, restless  at times due to severity. States  she has these episodes  Occasionally. It  improved with Oxycodone dose x 1. As discussed with patient, she is now off Ultram due to history of potential negative side effects and possible contributory role to recent serotonin syndrome patient suspects she had . Thus far tolerating  Neurontin, Cymbalta, Lidoderm patch well .   No current symptoms of serotonin syndrome. No side effects at this time.  Treatment Plan Summary: Daily contact with patient to assess and evaluate symptoms and progress in treatment, Medication management, Plan inpatient treatment and medications as below  Continue  Cymbalta to 60  mgrs QDAY for depression and anxiety- may also help chronic pain Change Neurontin to 300 mgrs BID and 900 mgrs QHS  for management of pain and anxiety- increased QHS dose may also help insomnia. Continue Klonopin 0.5 mgrs QAM and 1 mgr QHS for anxiety Continue Lidoderm patch for management of pain Encourage group , milieu participation to work on coping skills and symptom reduction. Consider discharge soon as patient continues to improve, stabilize. COBOS, FERNANDO 04/10/2015, 3:55 PM

## 2015-04-10 NOTE — Progress Notes (Signed)
Patient attended karaoke.

## 2015-04-10 NOTE — Progress Notes (Signed)
Writer has observed patient out in the hallway laughing and talking with another peer. Writer spoke with her 1:1 and she reports feeling much better after she received medication for her chronic back pain earlier. She reports that while here she is hopeful to get sleep and pain medications straightened out. She denies si/hi/a/v hallucinations. Support and encouragement given, safety maintained on unit with 15 min  checks.

## 2015-04-10 NOTE — Tx Team (Signed)
Interdisciplinary Treatment Plan Update (Adult) Date: 04/10/2015   Date: 04/10/2015 5:07 PM  Progress in Treatment:  Attending groups: Yes  Participating in groups: Yes  Taking medication as prescribed: Yes  Tolerating medication: Yes  Family/Significant othe contact made: No, Pt declines family contact  Patient understands diagnosis: Yes AEB seeking help for depression  Discussing patient identified problems/goals with staff: Yes  Medical problems stabilized or resolved: Yes  Denies suicidal/homicidal ideation: Yes Patient has not harmed self or Others: Yes   New problem(s) identified: None identified at this time.   Discharge Plan or Barriers: Pt will return to her father's home and follow-up with [place]  Additional comments: n/a   Reason for Continuation of Hospitalization:  Depression Medication stabilization Suicidal ideation   Estimated length of stay: 1 day  Review of initial/current patient goals per problem list:   1.  Goal(s): Patient will participate in aftercare plan  Met:  Yes  Target date: 3-5 days from date of admission   As evidenced by: Patient will participate within aftercare plan AEB aftercare provider and housing plan at discharge being identified.   04/07/15: Pt will return home and follow-up with [place]  2.  Goal (s): Patient will exhibit decreased depressive symptoms and suicidal ideations.  Met:  Yes  Target date: 3-5 days from date of admission   As evidenced by: Patient will utilize self rating of depression at 3 or below and demonstrate decreased signs of depression or be deemed stable for discharge by MD.  04/07/15: Pt recently admitted with high levels of depression but reports that it is getting better. Rating depression at 7/10; denies SI  04/10/15: Pt reports that her depression is due to pain. MD feels that Pt's depressive symptoms have decrased to the point they can be managed in the  outpatient setting.  3.  Goal(s): Patient will demonstrate decreased signs and symptoms of anxiety.  Met:  Yes  Target date: 3-5 days from date of admission   As evidenced by: Patient will utilize self rating of anxiety at 3 or below and demonstrated decreased signs of anxiety, or be deemed stable for discharge by MD 04/07/15: Pt was admitted with increased levels of anxiety and is currently rating those symptoms highly. Pt will demonstrated decreased symptoms of anxiety and rate it at 3/10 prior to d/c. 04/10/15: Pt reports that her anxiety is directly related to pain. MD feels that symptoms have decreased to the point where they can be managed in an outpatient setting.  Attendees:  Patient:    Family:    Physician: Dr. Parke Poisson, MD  04/10/2015 5:07 PM  Nursing: Lars Pinks, RN Case manager  04/10/2015 5:07 PM  Clinical Social Worker Norman Clay, MSW 04/10/2015 5:07 PM  Other: Lucinda Dell, Beverly Sessions Liasion 04/10/2015 5:07 PM  Clinical: Grayland Ormond, RN 04/10/2015 5:07 PM  Other: , RN Charge Nurse 04/10/2015 5:07 PM  Other:     [name], Latanya Presser MSW

## 2015-04-11 MED ORDER — OXYCODONE HCL 5 MG PO TABS
5.0000 mg | ORAL_TABLET | Freq: Three times a day (TID) | ORAL | Status: DC | PRN
  Administered 2015-04-11 – 2015-04-12 (×4): 5 mg via ORAL
  Filled 2015-04-11 (×4): qty 1

## 2015-04-11 MED ORDER — GABAPENTIN 300 MG PO CAPS
300.0000 mg | ORAL_CAPSULE | Freq: Two times a day (BID) | ORAL | Status: DC
  Administered 2015-04-11 – 2015-04-12 (×2): 300 mg via ORAL
  Filled 2015-04-11 (×6): qty 1

## 2015-04-11 MED ORDER — DOCUSATE SODIUM 50 MG PO CAPS
50.0000 mg | ORAL_CAPSULE | Freq: Every day | ORAL | Status: DC | PRN
  Filled 2015-04-11: qty 1

## 2015-04-11 NOTE — Progress Notes (Signed)
Patient has been observed up in the dayroom interacting appropriately with peers and attended group this evening. She was compliant with medications and has been on the phone frequently telling whomever she is talking to that she will see them on tomorrow. She denies si/hi/a/v hallucinations. Support an encouragement given, safety maintained on unit with 15 min checks.

## 2015-04-11 NOTE — [facility] Group Notes (Signed)
[place] [place] [facility] Group Notes:  (Nursing/MHT/Case Management/Adjunct)  Date:  04/11/2015  Time:  9:52 AM  Type of Therapy:  Psychoeducational Skills  Participation Level:  Active  Participation Quality:  Appropriate  Affect:  Appropriate  Cognitive:  Appropriate  Insight:  Appropriate  Engagement in Group:  Engaged  Modes of Intervention:  Discussion  Summary of Progress/Problems: Pt did attend self inventory group.       Peggy Austin 04/11/2015, 9:52 AM

## 2015-04-11 NOTE — Progress Notes (Signed)
Adult Psychoeducational Group Note  Date:  04/11/2015 Time:  9:15 PM  Group Topic/Focus:  Wrap-Up Group:   The focus of this group is to help patients review their daily goal of treatment and discuss progress on daily workbooks.  Participation Level:  Active  Participation Quality:  Appropriate and Attentive  Affect:  Appropriate  Cognitive:  Appropriate  Insight: Appropriate and Good  Engagement in Group:  Engaged  Modes of Intervention:  Education  Additional Comments:  Pt overall had a good day. Pt goal for tomorrow is to discharge and work on anxiety level.   Peggy Austin 04/11/2015, 9:15 PM

## 2015-04-11 NOTE — [facility] Group Notes (Signed)
[place] Group Notes:  (Clinical Social Work)  04/11/2015     1:15-2:15PM  Summary of Progress/Problems:   The main focus of today's process group was to discuss patient feelings about being in the hospital and how they can focus on the positive aspects of it.  Comparison and contrast was done of people's feelings and symptoms, and they provided ideas and supports to each other.  Cailin was very supportive of other patients, and gave them supportive feedback.  Type of Therapy:  Group Therapy - Process   Participation Level:  Active  Participation Quality:  Appropriate, Attentive and Sharing  Affect:  Blunted  Cognitive:  Alert, Appropriate and Oriented  Insight:  Engaged  Engagement in Therapy:  Engaged  Modes of Intervention:  Education, Motivational Interviewing  Selmer Dominion, LCSW 04/11/2015, 3:16 PM

## 2015-04-11 NOTE — Progress Notes (Signed)
D Peggy Austin is seen OOB [facility] on the [facility] all today...she tolerates this well. She remains hyperverbal. Anxious. Hypomanic. She has a labile mood . this is evidenced by her talking very fast and unable to understand her to her laying in her bed with the covers over her eyes, not answering name called.    A She completed her daily assessment and it she wrote she deneid SI and she rated her depression, hopelessness and anxiety " 2/0/4", respectively.    R Safety is in place and poc cont.

## 2015-04-11 NOTE — [facility] Group Notes (Signed)
[place] Group Notes:  (Nursing/MHT/Case Management/Adjunct)  Date:  04/11/2015  Time:  11:37 AM  Type of Therapy:  Psychoeducational Skills  Participation Level:  Active  Participation Quality:  Appropriate  Affect:  Appropriate  Cognitive:  Appropriate  Insight:  Appropriate  Engagement in Group:  Engaged  Modes of Intervention:  Discussion  Summary of Progress/Problems: Pt did attend self healthy coping skills group.   Benancio Deeds Shanta 04/11/2015, 11:37 AM

## 2015-04-11 NOTE — Progress Notes (Signed)
[facility] MD Progress Note  04/11/2015 8:15 AM Peggy Austin  MRN:  [id number] Subjective:  Pt states she is not ready for d/c today due to pain and insomnia. States Lidocaine patch helped for 12 hrs and at night pain is not well controlled. Pt took opiate last night and it helped for about 6 hrs. After which pt was not able to sleep and was walking the halls. She is currently using hot packs and it helping some. States if d/c without adequate pain control it will make her depressed again. Multiple medical problems also contribute to depression.   Depression is about 1/10. It is mostly frustration due to her pain. States pain makes her agitated. Reports burning on the bottom of her feet and asking for Neurontin in the AM with pain meds. Denies anhedonia, worthlessness and hopelessness.   Today denies SI/HI.   Objective :  patient seen and chart reviewed.  This morning patient presented with pain with some discomfortable.   She describes, as above, chronic pain and states " and every now and again, particularly if I have been moving around a lot, I get this terrible pain on top of it ". At present improved, and although still reporting painful gait, chronic pain, presents much more comfortable and with a bright affect. Concerned that she is having dental infection.  She states she has decided not to take Ultram after return home, due to concern it could have contributed to suspected serotonin syndrome, and because she feels it has contributed to other side effects. She is hoping to be able to manage pain with non narcotic strategies. She has been more visible in day room and has gone to some groups . No agitated behaviors on unit . TSH low - patient on Synthroid. We discussed , patient plans to follow up with PCP for ongoing thyroid management / monitoring   Principal Problem:  MDD  Diagnosis:   Patient Active Problem List   Diagnosis Date Noted  . Severe major depression without psychotic features  ([place]) [F32.2] 04/06/2015  . Major depressive disorder, recurrent episode, severe ([place]) [F33.2] 04/06/2015  . Medication side effect [T88.7XXA] 03/26/2015  . PREGNANCY-INDUCED HYPERTENSION [IMO0002] 06/28/2010  . GESTATIONAL DIABETES [O99.810] 06/28/2010  . DYSPNEA ON EXERTION [R06.09, R09.89] 06/28/2010  . OBESITY [E66.9] 09/14/2009  . CONSTIPATION [K59.00] 09/14/2009  . NAUSEA AND VOMITING [R11.2] 09/14/2009  . NAUSEA ALONE [R11.0] 09/14/2009  . FLATULENCE [R14.3, R14.1, R14.2] 09/14/2009  . ABDOMINAL PAIN, GENERALIZED [R10.84] 09/14/2009  . INTESTINAL MALABSORPTION, POSTSURGICAL [K91.2] 06/10/2008  . SKIN LESIONS, MULTIPLE [L98.9] 06/10/2008  . PRIOR LAPAROSCOPIC ROUX-EN-BARIATRIC SURGERY [Z98.84] 03/17/2008  . ANEMIA-UNSPECIFIED [D64.9] 02/13/2008  . IRRITABLE BOWEL SYNDROME [K58.9] 02/13/2008  . ABDOMINAL PAIN-MULTIPLE SITES [R10.9] 02/13/2008   Total Time spent with patient: 25 minutes     Past Medical History:  Past Medical History  Diagnosis Date  . Hernia   . IBS (irritable bowel syndrome)   . Ulcer   . Thyroid disease   . PIH (pregnancy induced hypertension)   . Asthma   . Gastroparesis   . Pancreatitis   . Partial bowel obstruction ([place])   . GERD (gastroesophageal reflux disease)   . DDD (degenerative disc disease), cervical   . Recurrent oral ulcers   . Lumbar herniated disc     Past Surgical History  Procedure Laterality Date  . Gastric bypass    . Cholecystectomy    . Tubal ligation    . Endometrial ablation    . Hernia  repair    . Back surgery    . Cesarean section     Family History: History reviewed. No pertinent family history.  Social History:  History  Alcohol Use No    Comment:       History  Drug Use No    Social History   Social History  . Marital Status: Divorced    Spouse Name: N/A  . Number of Children: N/A  . Years of Education: N/A   Social History Main Topics  . Smoking status: Never Smoker   . Smokeless tobacco: Never  Used  . Alcohol Use: No     Comment:    . Drug Use: No  . Sexual Activity: Not Asked   Other Topics Concern  . None   Social History Narrative   Additional Social History:   Sleep: Fair  Appetite:  Good  Current Medications: Current Facility-Administered Medications  Medication Dose Route Frequency Provider Last Rate Last Dose  . acetaminophen (TYLENOL) tablet 650 mg  650 mg Oral Q6H PRN Patrecia Pour, NP   650 mg at 04/10/15 2106  . albuterol (PROVENTIL) (2.5 MG/3ML) 0.083% nebulizer solution 3 mL  3 mL Inhalation Q6H PRN Patrecia Pour, NP      . alum & mag hydroxide-simeth (MAALOX/MYLANTA) 200-200-20 MG/5ML suspension 30 mL  30 mL Oral Q4H PRN Patrecia Pour, NP      . clonazePAM Bobbye [place]) tablet 0.5 mg  0.5 mg Oral q morning - 10a Jenne Campus, MD   0.5 mg at 04/10/15 0934  . clonazePAM (KLONOPIN) tablet 1 mg  1 mg Oral QHS Jenne Campus, MD   1 mg at 04/10/15 2104  . DULoxetine (CYMBALTA) DR capsule 60 mg  60 mg Oral Daily Jenne Campus, MD   60 mg at 04/10/15 0816  . feeding supplement (ENSURE ENLIVE) (ENSURE ENLIVE) liquid 237 mL  237 mL Oral BID BM Myer Peer Cobos, MD   237 mL at 04/09/15 1500  . fluticasone (FLONASE) 50 MCG/ACT nasal spray 2 spray  2 spray Each Nare BID Patrecia Pour, NP   2 spray at 04/10/15 0816  . gabapentin (NEURONTIN) capsule 300 mg  300 mg Oral BID Jenne Campus, MD   300 mg at 04/10/15 2053  . gabapentin (NEURONTIN) capsule 900 mg  900 mg Oral QHS Jenne Campus, MD   900 mg at 04/10/15 2104  . ibuprofen (ADVIL,MOTRIN) tablet 600 mg  600 mg Oral Q6H PRN Laverle Hobby, PA-C      . levothyroxine (SYNTHROID, LEVOTHROID) tablet 50 mcg  50 mcg Oral QAC breakfast Patrecia Pour, NP   50 mcg at 04/11/15 0547  . lidocaine (LIDODERM) 5 % 1 patch  1 patch Transdermal Q24H Jenne Campus, MD   1 patch at 04/10/15 1328  . Linaclotide (LINZESS) capsule 145 mcg  145 mcg Oral Daily PRN Patrecia Pour, NP   145 mcg at 04/09/15 0847  .  loratadine (CLARITIN) tablet 10 mg  10 mg Oral Daily Patrecia Pour, NP   10 mg at 04/10/15 0816  . magnesium hydroxide (MILK OF MAGNESIA) suspension 30 mL  30 mL Oral Daily PRN Patrecia Pour, NP      . ondansetron (ZOFRAN-ODT) disintegrating tablet 4 mg  4 mg Oral Q8H PRN Laverle Hobby, PA-C   4 mg at 04/11/15 0547  . oxyCODONE (Oxy IR/ROXICODONE) immediate release tablet 5 mg  5 mg Oral Q12H PRN Myer Peer  Cobos, MD   5 mg at 04/11/15 0547  . pantoprazole (PROTONIX) EC tablet 20 mg  20 mg Oral Daily Patrecia Pour, NP   20 mg at 04/11/15 5597    Lab Results:  Results for orders placed or performed during the hospital encounter of 04/06/15 (from the past 48 hour(s))  TSH     Status: Abnormal   Collection Time: 04/09/15  6:49 PM  Result Value Ref Range   TSH 0.107 (L) 0.350 - 4.500 uIU/mL    Comment: Performed at [facility]  Glucose, capillary     Status: Abnormal   Collection Time: 04/09/15  6:55 PM  Result Value Ref Range   Glucose-Capillary 55 (L) 65 - 99 mg/dL   Comment 1 Notify RN    Comment 2 Document in Chart   Glucose, capillary     Status: Abnormal   Collection Time: 04/09/15  7:13 PM  Result Value Ref Range   Glucose-Capillary 106 (H) 65 - 99 mg/dL  Glucose, capillary     Status: None   Collection Time: 04/09/15  8:23 PM  Result Value Ref Range   Glucose-Capillary 80 65 - 99 mg/dL    Physical Findings: AIMS: Facial and Oral Movements Muscles of Facial Expression: None, normal Lips and Perioral Area: None, normal Jaw: None, normal Tongue: None, normal,Extremity Movements Upper (arms, wrists, hands, fingers): None, normal, Trunk Movements Neck, shoulders, hips: None, normal, Overall Severity Severity of abnormal movements (highest score from questions above): None, normal Patient's awareness of abnormal movements (rate only patient's report): No Awareness, Dental Status Current problems with teeth and/or dentures?: Yes Does patient usually wear  dentures?: Yes  CIWA:    COWS:     Musculoskeletal: Strength & Muscle Tone: within normal limits Gait & Station: normal Patient leans: N/A  Psychiatric Specialty Exam: Review of Systems  Constitutional: Negative for fever and chills.  Cardiovascular: Negative for chest pain and leg swelling.  Gastrointestinal: Positive for nausea and abdominal pain. Negative for vomiting.  Musculoskeletal: Positive for back pain and joint pain. Negative for falls.  Skin: Negative for itching and rash.  Neurological: Negative for dizziness, tremors and seizures.  Psychiatric/Behavioral: Positive for depression. Negative for suicidal ideas, hallucinations and substance abuse. The patient has insomnia. The patient is not nervous/anxious.   describes chronic back pain, often radiating to leg.   Blood pressure 105/67, pulse 89, temperature 97.4 F (36.3 C), temperature source Oral, resp. rate 21, height 5\' 2"  (1.575 m), weight 70.761 kg (156 lb).Body mass index is 28.53 kg/(m^2).  General Appearance: Casual  Eye Contact::  Good  Speech:  Normal Rate  Volume:  Normal  Mood:   Improved and at this time presents relatively euthymic   Affect:   Reactive, appropriate   Thought Process:  Goal Directed and Linear  Orientation:  Full (Time, Place, and Person)  Thought Content:  denies hallucinations, no delusions, not internally preoccupied   Suicidal Thoughts:  No denies suicidal ideations at this time, and contracts for safety on the unit   Homicidal Thoughts:  No  Memory:  recent and remote grossly intact   Judgement:   improved  Insight:  Present  Psychomotor Activity:  Normal-   Concentration:  Good  Recall:  Good  Fund of Knowledge:Good  Language: Good  Akathisia:  Negative  Handed:  Right  AIMS (if indicated):     Assets:  Communication Skills Desire for Improvement Resilience  ADL's:   Improved   Cognition: WNL  Sleep:  Number of Hours: 4  Assessment - mood and affect improved compared to  admission. Tolerating medications well at present and feels Neurontin titration has been well tolerated and partially effective in decreasing pain and improving sleep. Wants pain to be better controlled prior to d/c.  It improved with Oxycodone dose x 1. As discussed with patient, she is now off Ultram due to history of potential negative side effects and possible contributory role to recent serotonin syndrome patient suspects she had . Thus far tolerating  Neurontin, Cymbalta, Lidoderm patch well .   No current symptoms of serotonin syndrome. No side effects at this time.  Treatment Plan Summary: Daily contact with patient to assess and evaluate symptoms and progress in treatment, Medication management, Plan inpatient treatment and medications as below  Continue  Cymbalta to 60  mgrs QDAY for depression and anxiety- may also help chronic pain Change Neurontin to 300 mgrs BID and 900 mgrs QHS  for management of pain and anxiety- increased QHS dose may also help insomnia. Continue Klonopin 0.5 mgrs QAM and 1 mgr QHS for anxiety Continue Lidoderm patch for management of pain Increase Oxycodone 5mg  to po TID prn pain Added Colace for constipation Encourage group , milieu participation to work on coping skills and symptom reduction. Consider discharge soon as patient continues to improve, stabilize. Jaquasia Doscher 04/11/2015, 8:15 AM

## 2015-04-11 NOTE — Progress Notes (Signed)
Pt reported part of her bridge / tooth had " broken off". This Probation officer asked her what happened to the rest of the broken part and she responded " I dont know". She is mainly concerned about potential infection and this is relayed to NP. RN able to wiew (?) what appears to be a pointed tooth " piece" in left upper frontal jaw and pt agrees this is the tooth that cracked / broke. She denies pain and  she is given warm salt water to swish and spit ( per NP instruction) .

## 2015-04-12 ENCOUNTER — Encounter (HOSPITAL_COMMUNITY): Payer: Self-pay | Admitting: Emergency Medicine

## 2015-04-12 ENCOUNTER — Emergency Department (HOSPITAL_COMMUNITY)
Admission: EM | Admit: 2015-04-12 | Discharge: 2015-04-13 | Disposition: A | Discharge status: Home / Self Care | Payer: Medicaid Other | Attending: Physician Assistant | Admitting: Physician Assistant

## 2015-04-12 DIAGNOSIS — F4321 Adjustment disorder with depressed mood: Secondary | ICD-10-CM | POA: Insufficient documentation

## 2015-04-12 DIAGNOSIS — J45909 Unspecified asthma, uncomplicated: Secondary | ICD-10-CM | POA: Insufficient documentation

## 2015-04-12 DIAGNOSIS — Z7951 Long term (current) use of inhaled steroids: Secondary | ICD-10-CM | POA: Insufficient documentation

## 2015-04-12 DIAGNOSIS — Z79899 Other long term (current) drug therapy: Secondary | ICD-10-CM | POA: Diagnosis not present

## 2015-04-12 DIAGNOSIS — F1310 Sedative, hypnotic or anxiolytic abuse, uncomplicated: Secondary | ICD-10-CM | POA: Insufficient documentation

## 2015-04-12 DIAGNOSIS — E079 Disorder of thyroid, unspecified: Secondary | ICD-10-CM | POA: Insufficient documentation

## 2015-04-12 DIAGNOSIS — Z760 Encounter for issue of repeat prescription: Secondary | ICD-10-CM | POA: Insufficient documentation

## 2015-04-12 DIAGNOSIS — Z8739 Personal history of other diseases of the musculoskeletal system and connective tissue: Secondary | ICD-10-CM | POA: Insufficient documentation

## 2015-04-12 DIAGNOSIS — K219 Gastro-esophageal reflux disease without esophagitis: Secondary | ICD-10-CM | POA: Insufficient documentation

## 2015-04-12 DIAGNOSIS — Z880 Allergy status to penicillin: Secondary | ICD-10-CM | POA: Insufficient documentation

## 2015-04-12 DIAGNOSIS — F419 Anxiety disorder, unspecified: Secondary | ICD-10-CM | POA: Insufficient documentation

## 2015-04-12 DIAGNOSIS — F332 Major depressive disorder, recurrent severe without psychotic features: Secondary | ICD-10-CM | POA: Insufficient documentation

## 2015-04-12 LAB — CBC
HCT: 30.9 % — ABNORMAL LOW (ref 36.0–46.0)
Hemoglobin: 9.7 g/dL — ABNORMAL LOW (ref 12.0–15.0)
MCH: 25.7 pg — AB (ref 26.0–34.0)
MCHC: 31.4 g/dL (ref 30.0–36.0)
MCV: 82 fL (ref 78.0–100.0)
PLATELETS: 281 10*3/uL (ref 150–400)
RBC: 3.77 MIL/uL — ABNORMAL LOW (ref 3.87–5.11)
RDW: 16.4 % — AB (ref 11.5–15.5)
WBC: 5.8 10*3/uL (ref 4.0–10.5)

## 2015-04-12 LAB — COMPREHENSIVE METABOLIC PANEL
ALK PHOS: 64 U/L (ref 38–126)
ALT: 16 U/L (ref 14–54)
AST: 23 U/L (ref 15–41)
Albumin: 3.4 g/dL — ABNORMAL LOW (ref 3.5–5.0)
Anion gap: 5 (ref 5–15)
BUN: 19 mg/dL (ref 6–20)
CALCIUM: 9.3 mg/dL (ref 8.9–10.3)
CO2: 27 mmol/L (ref 22–32)
CREATININE: 0.53 mg/dL (ref 0.44–1.00)
Chloride: 107 mmol/L (ref 101–111)
GFR calc non Af Amer: >60 mL/min (ref >60)
Glucose, Bld: 73 mg/dL (ref 65–99)
Potassium: 4.5 mmol/L (ref 3.5–5.1)
SODIUM: 139 mmol/L (ref 135–145)
Total Bilirubin: 0.2 mg/dL — ABNORMAL LOW (ref 0.3–1.2)
Total Protein: 6.4 g/dL — ABNORMAL LOW (ref 6.5–8.1)

## 2015-04-12 LAB — RAPID URINE DRUG SCREEN, HOSP PERFORMED
Amphetamines: NOT DETECTED
BENZODIAZEPINES: POSITIVE — AB
Barbiturates: NOT DETECTED
Cocaine: NOT DETECTED
Opiates: NOT DETECTED
Tetrahydrocannabinol: NOT DETECTED

## 2015-04-12 LAB — ETHANOL: Alcohol, Ethyl (B): <5 mg/dL (ref <5)

## 2015-04-12 MED ORDER — GABAPENTIN 300 MG PO CAPS: 300.0000 mg | ORAL_CAPSULE | Freq: Two times a day (BID) | ORAL | Status: DC

## 2015-04-12 MED ORDER — DOCUSATE SODIUM 50 MG PO CAPS: 50.0000 mg | ORAL_CAPSULE | Freq: Every day | ORAL | Status: DC | PRN

## 2015-04-12 MED ORDER — GABAPENTIN 300 MG PO CAPS
300.0000 mg | ORAL_CAPSULE | Freq: Three times a day (TID) | ORAL | Status: DC
  Administered 2015-04-12 – 2015-04-13 (×2): 300 mg via ORAL
  Filled 2015-04-12 (×2): qty 1

## 2015-04-12 MED ORDER — ONDANSETRON HCL 4 MG PO TABS
4.0000 mg | ORAL_TABLET | Freq: Once | ORAL | Status: AC
  Administered 2015-04-12: 4 mg via ORAL
  Filled 2015-04-12: qty 1

## 2015-04-12 MED ORDER — ALBUTEROL SULFATE HFA 108 (90 BASE) MCG/ACT IN AERS: 1.0000 | INHALATION_SPRAY | Freq: Four times a day (QID) | RESPIRATORY_TRACT | Status: DC | PRN

## 2015-04-12 MED ORDER — ONDANSETRON HCL 4 MG PO TABS: 4.0000 mg | ORAL_TABLET | Freq: Four times a day (QID) | ORAL | Status: DC | PRN

## 2015-04-12 MED ORDER — DULOXETINE HCL 60 MG PO CPEP: 60.0000 mg | ORAL_CAPSULE | Freq: Every day | ORAL | Status: AC

## 2015-04-12 MED ORDER — LORAZEPAM 1 MG PO TABS
1.0000 mg | ORAL_TABLET | Freq: Three times a day (TID) | ORAL | Status: DC | PRN
  Administered 2015-04-12 – 2015-04-13 (×2): 1 mg via ORAL
  Filled 2015-04-12 (×3): qty 1

## 2015-04-12 MED ORDER — ONDANSETRON HCL 4 MG PO TABS
4.0000 mg | ORAL_TABLET | Freq: Three times a day (TID) | ORAL | Status: DC | PRN
  Administered 2015-04-13: 4 mg via ORAL
  Filled 2015-04-12: qty 1

## 2015-04-12 MED ORDER — PANTOPRAZOLE SODIUM 20 MG PO TBEC: 20.0000 mg | DELAYED_RELEASE_TABLET | Freq: Every day | ORAL | Status: AC

## 2015-04-12 MED ORDER — OXYCODONE-ACETAMINOPHEN 5-325 MG PO TABS
1.0000 | ORAL_TABLET | Freq: Four times a day (QID) | ORAL | Status: DC | PRN
  Administered 2015-04-12 – 2015-04-13 (×2): 1 via ORAL
  Filled 2015-04-12 (×2): qty 1

## 2015-04-12 MED ORDER — CLONAZEPAM 0.5 MG PO TABS: ORAL_TABLET | ORAL | Status: DC

## 2015-04-12 MED ORDER — PANTOPRAZOLE SODIUM 20 MG PO TBEC
20.0000 mg | DELAYED_RELEASE_TABLET | Freq: Every day | ORAL | Status: DC
  Administered 2015-04-13: 20 mg via ORAL
  Filled 2015-04-12: qty 1

## 2015-04-12 MED ORDER — DOCUSATE SODIUM 50 MG PO CAPS
50.0000 mg | ORAL_CAPSULE | Freq: Every day | ORAL | Status: DC | PRN
  Filled 2015-04-12: qty 1

## 2015-04-12 MED ORDER — LORATADINE 10 MG PO TABS
10.0000 mg | ORAL_TABLET | Freq: Every day | ORAL | Status: DC
  Administered 2015-04-13: 10 mg via ORAL
  Filled 2015-04-12: qty 1

## 2015-04-12 MED ORDER — CLONAZEPAM 0.5 MG PO TABS
0.5000 mg | ORAL_TABLET | Freq: Two times a day (BID) | ORAL | Status: DC
  Administered 2015-04-12 – 2015-04-13 (×2): 0.5 mg via ORAL
  Filled 2015-04-12 (×2): qty 1

## 2015-04-12 MED ORDER — LORATADINE 10 MG PO TABS: 10.0000 mg | ORAL_TABLET | Freq: Every day | ORAL | Status: DC

## 2015-04-12 MED ORDER — OXYCODONE-ACETAMINOPHEN 5-325 MG PO TABS
1.0000 | ORAL_TABLET | Freq: Once | ORAL | Status: AC
  Administered 2015-04-12: 1 via ORAL
  Filled 2015-04-12: qty 1

## 2015-04-12 MED ORDER — ACETAMINOPHEN 325 MG PO TABS
650.0000 mg | ORAL_TABLET | ORAL | Status: DC | PRN
  Administered 2015-04-13: 650 mg via ORAL
  Filled 2015-04-12: qty 2

## 2015-04-12 MED ORDER — LEVOTHYROXINE SODIUM 50 MCG PO TABS
50.0000 ug | ORAL_TABLET | Freq: Every day | ORAL | Status: DC
  Administered 2015-04-13: 50 ug via ORAL
  Filled 2015-04-12 (×2): qty 1

## 2015-04-12 NOTE — ED Notes (Signed)
Pt was d/c from [facility] today and when she got home she had overwhelming feelings of anxiety and increase stress. Pt having feelings hopelessness and despair. Pt reported that the NP told her something different and she was sent home on wrong medication dosages and not given any pain medication prescription. Pt reported having tooth break last night and currently having nerve pain in her mouth.

## 2015-04-12 NOTE — ED Notes (Signed)
Pt. Noted sleeping in room. No complaints or concerns voiced. No distress or abnormal behavior noted. Will continue to monitor with security cameras. Q 15 minute rounds continue. 

## 2015-04-12 NOTE — ED Notes (Signed)
Pt. Noted in room. No acute distress or abnormal behavior noted. Will continue to monitor with security cameras. Q 15 minute rounds continue. 

## 2015-04-12 NOTE — Progress Notes (Signed)
Adult Psychoeducational Group Note  Date:  04/12/2015 Time:  0900  Group Topic/Focus:  Orientation:   The focus of this group is to educate the patient on the purpose and policies of crisis stabilization and provide a format to answer questions about their admission.  The group details unit policies and expectations of patients while admitted.  Participation Level:  Active  Participation Quality:  Appropriate  Affect:  Appropriate  Cognitive:  Appropriate  Insight: Appropriate  Engagement in Group:  Engaged  Modes of Intervention:  Orientation  Additional Comments:    Peggy Austin L 04/12/2015, 10:00 AM

## 2015-04-12 NOTE — Discharge Summary (Signed)
Physician Discharge Summary Note  Patient:  Peggy Austin is an 44 y.o., female MRN:  [id number] DOB:  March 03, 1971 Patient phone:  [phone] (home)  Patient address:   [address] [place] [address],  Total Time spent with patient: 30 minutes  Date of Admission:  04/06/2015 Date of Discharge: 04/12/2015  Reason for Admission:PER HPI 44 year old female. States that about 1-2 weeks ago she developed " Serotonin Syndrome " related to prozac, ultram Combination. Patient states she does not remember episode well, but was told by family members she was incoherent, " twitching", irritable, and that " they thought I was on drugs ". She states she was brought to the hospital , and was discharged after a couple of days . Patient states that her brother was visiting from [place], and that after he left late last week, she felt intensely depressed, ruminative about her stressors ( not working, brother left, adolescent son soon to leave home ) . She states she told her boyfriend that " I did not want to live anymore " , so that " my boyfriend and my son kept me under close observation" , and eventually decided to bring her to the hospital  Principal Problem: Major depressive disorder, recurrent episode, severe [facility]) Discharge Diagnoses: Patient Active Problem List   Diagnosis Date Noted  . Severe major depression without psychotic features ([place]) [F32.2] 04/06/2015  . Major depressive disorder, recurrent episode, severe ([place]) [F33.2] 04/06/2015  . Medication side effect [T88.7XXA] 03/26/2015  . PREGNANCY-INDUCED HYPERTENSION [IMO0002] 06/28/2010  . GESTATIONAL DIABETES [O99.810] 06/28/2010  . DYSPNEA ON EXERTION [R06.09, R09.89] 06/28/2010  . OBESITY [E66.9] 09/14/2009  . CONSTIPATION [K59.00] 09/14/2009  . NAUSEA AND VOMITING [R11.2] 09/14/2009  . NAUSEA ALONE [R11.0] 09/14/2009  . FLATULENCE [R14.3, R14.1, R14.2] 09/14/2009  . ABDOMINAL PAIN, GENERALIZED [R10.84] 09/14/2009   . INTESTINAL MALABSORPTION, POSTSURGICAL [K91.2] 06/10/2008  . SKIN LESIONS, MULTIPLE [L98.9] 06/10/2008  . PRIOR LAPAROSCOPIC ROUX-EN-BARIATRIC SURGERY [Z98.84] 03/17/2008  . ANEMIA-UNSPECIFIED [D64.9] 02/13/2008  . IRRITABLE BOWEL SYNDROME [K58.9] 02/13/2008  . ABDOMINAL PAIN-MULTIPLE SITES [R10.9] 02/13/2008    Musculoskeletal: Strength & Muscle Tone: within normal limits Gait & Station: normal Patient leans: N/A  Psychiatric Specialty Exam: SEE SRA Physical Exam  Nursing note and vitals reviewed. Constitutional: She is oriented to person, place, and time. She appears well-developed.  Eyes: Pupils are equal, round, and reactive to light.  Neck: Normal range of motion.  Neurological: She is alert and oriented to person, place, and time.  Skin: Skin is warm and dry.  Psychiatric: She has a normal mood and affect. Her behavior is normal. Thought content normal.    Review of Systems  Constitutional:       Patient c/o broken tooth, and possible tooth infection   Musculoskeletal: Positive for back pain.       Patient c/o chronic back pain 8/10  Psychiatric/Behavioral: Negative for suicidal ideas. Depression: stable. Nervous/anxious: stable.   All other systems reviewed and are negative.   Blood pressure 117/82, pulse 94, temperature 98.4 F (36.9 C), temperature source Oral, resp. rate 14, height 5\' 2"  (1.575 m), weight 70.761 kg (156 lb).Body mass index is 28.53 kg/(m^2).  Have you used any form of tobacco in the last 30 days? (Cigarettes, Smokeless Tobacco, Cigars, and/or Pipes): No  Has this patient used any form of tobacco in the last 30 days? (Cigarettes, Smokeless Tobacco, Cigars, and/or Pipes) No  Past Medical History:  Past Medical History  Diagnosis Date  . Hernia   .  IBS (irritable bowel syndrome)   . Ulcer   . Thyroid disease   . PIH (pregnancy induced hypertension)   . Asthma   . Gastroparesis   . Pancreatitis   . Partial bowel obstruction ([place])   . GERD  (gastroesophageal reflux disease)   . DDD (degenerative disc disease), cervical   . Recurrent oral ulcers   . Lumbar herniated disc     Past Surgical History  Procedure Laterality Date  . Gastric bypass    . Cholecystectomy    . Tubal ligation    . Endometrial ablation    . Hernia repair    . Back surgery    . Cesarean section     Family History: History reviewed. No pertinent family history. Social History:  History  Alcohol Use No    Comment:       History  Drug Use No    Social History   Social History  . Marital Status: Divorced    Spouse Name: N/A  . Number of Children: N/A  . Years of Education: N/A   Social History Main Topics  . Smoking status: Never Smoker   . Smokeless tobacco: Never Used  . Alcohol Use: No     Comment:    . Drug Use: No  . Sexual Activity: Not Asked   Other Topics Concern  . None   Social History Narrative    Past Psychiatric History: Hospitalizations:  Outpatient Care:  Substance Abuse Care:  Self-Mutilation:  Suicidal Attempts:  Violent Behaviors:   Risk to Self: Is patient at risk for suicide?: No What has been your use of drugs/alcohol within the last 12 months?: Pt reports that she has a history of misuing pain pills and benzodzapines however reports that she does not feel that she is addicted or has a substance abuse problem Risk to Others:  no Prior Inpatient Therapy:  yes Prior Outpatient Therapy:  yes  Level of Care:  OP  Hospital Course:  VAISHALI BAISE was admitted for Major depressive disorder, recurrent episode, severe ([place]) and crisis management.  She was treated discharged with the medications listed below under Medication List.  Medical problems were identified and treated as needed.  Home medications were restarted as appropriate.  Improvement was monitored by observation and [name] daily report of symptom reduction.  Emotional and mental status was monitored by daily self-inventory reports completed  by [name] and clinical staff.         [name] was evaluated by the treatment team for stability and plans for continued recovery upon discharge.  [name] motivation was an integral factor for scheduling further treatment.  Employment, transportation, bed availability, health status, family support, and any pending legal issues were also considered duringher hospital stay.  She was offered further treatment options upon discharge including but not limited to Residential, Intensive Outpatient, and Outpatient treatment.  [name] will follow up with the services as listed below under Follow Up Information.     Upon completion of this admission the patient was both mentally and medically stable for discharge denying suicidal/homicidal ideation, auditory/visual/tactile hallucinations, delusional thoughts and paranoia.      Consults:  psychiatry  Significant Diagnostic Studies:  labs: Glucpse 106, TSH 0.107, UDS+ [place], CMP: total bilirubin <0.1  Discharge Vitals:   Blood pressure 117/82, pulse 94, temperature 98.4 F (36.9 C), temperature source Oral, resp. rate 14, height 5\' 2"  (1.575 m), weight 70.761 kg (156 lb). Body  mass index is 28.53 kg/(m^2). Lab Results:   Results for orders placed or performed during the hospital encounter of 04/06/15 (from the past 72 hour(s))  TSH     Status: Abnormal   Collection Time: 04/09/15  6:49 PM  Result Value Ref Range   TSH 0.107 (L) 0.350 - 4.500 uIU/mL    Comment: Performed at [facility]  Glucose, capillary     Status: Abnormal   Collection Time: 04/09/15  6:55 PM  Result Value Ref Range   Glucose-Capillary 55 (L) 65 - 99 mg/dL   Comment 1 Notify RN    Comment 2 Document in Chart   Glucose, capillary     Status: Abnormal   Collection Time: 04/09/15  7:13 PM  Result Value Ref Range   Glucose-Capillary 106 (H) 65 - 99 mg/dL  Glucose, capillary     Status: None   Collection Time: 04/09/15  8:23 PM   Result Value Ref Range   Glucose-Capillary 80 65 - 99 mg/dL    Physical Findings: AIMS: Facial and Oral Movements Muscles of Facial Expression: None, normal Lips and Perioral Area: None, normal Jaw: None, normal Tongue: None, normal,Extremity Movements Upper (arms, wrists, hands, fingers): None, normal Lower (legs, knees, ankles, toes): None, normal, Trunk Movements Neck, shoulders, hips: None, normal, Overall Severity Severity of abnormal movements (highest score from questions above): None, normal Incapacitation due to abnormal movements: None, normal Patient's awareness of abnormal movements (rate only patient's report): No Awareness, Dental Status Current problems with teeth and/or dentures?: No Does patient usually wear dentures?: No  CIWA:  CIWA-Ar Total: 1 COWS:      See Psychiatric Specialty Exam and Suicide Risk Assessment completed by Attending Physician prior to discharge.  Discharge destination:  Home  Is patient on multiple antipsychotic therapies at discharge:  No   Has Patient had three or more failed trials of antipsychotic monotherapy by history:  No    Recommended Plan for Multiple Antipsychotic Therapies: NA  Discharge Instructions    Activity as tolerated - No restrictions    Complete by:  As directed      Diet general    Complete by:  As directed      Discharge instructions    Complete by:  As directed   Patient has been instructed to take medications as prescribed; and report adverse effects to outpatient provider.  Follow up with primary doctor for any medical issues and If symptoms recur report to nearest emergency or crisis hot line.            Medication List    STOP taking these medications        amphetamine-dextroamphetamine 20 MG tablet  Commonly known as:  ADDERALL     B-12 PO     buPROPion 150 MG 24 hr tablet  Commonly known as:  WELLBUTRIN XL     cyclobenzaprine 10 MG tablet  Commonly known as:  FLEXERIL     dicyclomine  10 MG capsule  Commonly known as:  BENTYL     ferrous sulfate dried 160 (50 FE) MG Tbcr SR tablet  Commonly known as:  SLOW FE     multivitamin per tablet     mupirocin cream 2 %  Commonly known as:  BACTROBAN     omeprazole 20 MG capsule  Commonly known as:  PRILOSEC     ondansetron 4 MG tablet  Commonly known as:  ZOFRAN     polyethylene glycol powder powder  Commonly known  as:  GLYCOLAX/MIRALAX     promethazine 25 MG tablet  Commonly known as:  PHENERGAN     traMADol 50 MG tablet  Commonly known as:  ULTRAM      TAKE these medications      Indication   albuterol 108 (90 BASE) MCG/ACT inhaler  Commonly known as:  PROVENTIL HFA;VENTOLIN HFA  Inhale 1-2 puffs into the lungs every 6 (six) hours as needed for wheezing or shortness of breath.      clonazePAM 0.5 MG tablet  Commonly known as:  KLONOPIN  Take 1 tablet by mouth (0.5mg ) in morning, Take 2 tablets by mouth (1 mg) at bedtime   Indication:  mood stabilization     docusate sodium 50 MG capsule  Commonly known as:  COLACE  Take 1 capsule (50 mg total) by mouth daily as needed for mild constipation.   Indication:  Constipation     DULoxetine 60 MG capsule  Commonly known as:  CYMBALTA  Take 1 capsule (60 mg total) by mouth daily.   Indication:  Major Depressive Disorder     fluticasone 50 MCG/ACT nasal spray  Commonly known as:  FLONASE  Place 2 sprays into both nostrils 2 (two) times daily.      gabapentin 300 MG capsule  Commonly known as:  NEURONTIN  Take 1 capsule (300 mg total) by mouth 2 (two) times daily.   Indication:  mood stabliization     levothyroxine 50 MCG tablet  Commonly known as:  SYNTHROID, LEVOTHROID  Take 50 mcg by mouth daily before breakfast.      LINZESS 145 MCG Caps capsule  Generic drug:  Linaclotide  Take 145 mcg by mouth daily as needed (severe constipation).      loratadine 10 MG tablet  Commonly known as:  CLARITIN  Take 1 tablet (10 mg total) by mouth daily.    Indication:  Hayfever     pantoprazole 20 MG tablet  Commonly known as:  PROTONIX  Take 1 tablet (20 mg total) by mouth daily.   Indication:  Gastroesophageal Reflux Disease           Follow-up Information    Follow up with [facility] On 04/13/2015.   Why:  at 1:00pm for therapy with Vidal Schwalbe   Contact information:   [address] [place], [place]. [address] [phone] Fax: [phone]      Follow up with [facility] On 04/17/2015.   Why:  at 2:00pm for medication management with Dr. Nicholes Mango information:   [phone] [place], [place]. [address] [phone] Fax: [phone]      Follow-up recommendations:  Activity:  as tolerated Diet:  heart healthy Other:  [facility] Appt: for complaints of chronic back pain and broken tooth with possible infection.  Comments:  Take all of you medications as prescribed by your mental healthcare provider.  Report any adverse effects and reactions from your medications to your outpatient provider promptly. Do not engage in alcohol and or illegal drug use while on prescription medicines. In the event of worsening symptoms call the crisis hotline, 911, and or go to the nearest emergency department for appropriate evaluation and treatment of symptoms. Follow-up with your primary care provider for your medical issues, concerns and or health care needs.   Keep all scheduled appointments.  If you are unable to keep an appointment call to reschedule.  Let the nurse know if you will need medications before  next scheduled appointment.  Total Discharge Time: 30 Minutes   Signed: [place] 04/12/2015, 8:38 AM

## 2015-04-12 NOTE — ED Notes (Signed)
Pt. To [facility] from ED ambulatory without difficulty, to room 35 . Report from [employer]. Pt. Is alert and oriented, warm and dry in no distress. Pt. Denies SI, HI, and AVH. Pt. Calm and cooperative. Pt. Made aware of security cameras and Q15 minute rounds. Pt. Encouraged to let Nursing staff know of any concerns or needs.

## 2015-04-12 NOTE — [facility] Suicide Risk Assessment (Signed)
[facility] Discharge Suicide Risk Assessment   Demographic Factors:  Caucasian  Total Time spent with patient: 30 minutes  Musculoskeletal: Strength & Muscle Tone: within normal limits Gait & Station: normal Patient leans: N/A  Psychiatric Specialty Exam: Physical Exam  Review of Systems  Constitutional: Positive for fever. Negative for chills.  Respiratory: Negative for cough and shortness of breath.   Cardiovascular: Negative for chest pain.  Gastrointestinal: Positive for abdominal pain.  Musculoskeletal: Positive for back pain.  Skin: Negative for itching and rash.  Neurological: Negative for dizziness, tremors, seizures, loss of consciousness and headaches.  Psychiatric/Behavioral: Negative for depression, suicidal ideas, hallucinations and substance abuse. The patient is not nervous/anxious and does not have insomnia.     Blood pressure 105/67, pulse 89, temperature 97.4 F (36.3 C), temperature source Oral, resp. rate 21, height 5\' 2"  (1.575 m), weight 70.761 kg (156 lb).Body mass index is 28.53 kg/(m^2).  General Appearance: Casual  Eye Contact::  Good  Speech:  Clear and Coherent and mildly increased409  Volume:  Normal  Mood:  Euthymic  Affect:  Appropriate  Thought Process:  Circumstantial  Orientation:  Full (Time, Place, and Person)  Thought Content:  Negative  Suicidal Thoughts:  No  Homicidal Thoughts:  No  Memory:  Immediate;   Good Recent;   Good Remote;   Good  Judgement:  Good  Insight:  Good  Psychomotor Activity:  Normal  Concentration:  Good  Recall:  Good  Fund of Knowledge:Good  Language: Good  Akathisia:  No  Handed:  Right  AIMS (if indicated):     Assets:  Communication Skills Desire for Improvement Social Support Talents/Skills Transportation  Sleep:  Number of Hours: 6.5  Cognition: WNL  ADL's:  Intact   Have you used any form of tobacco in the last 30 days? (Cigarettes, Smokeless Tobacco, Cigars, and/or Pipes): No  Has this patient  used any form of tobacco in the last 30 days? (Cigarettes, Smokeless Tobacco, Cigars, and/or Pipes) No  Mental Status Per Nursing Assessment::   On Admission:  Suicidal ideation indicated by patient, Suicidal ideation indicated by others, Self-harm thoughts, Self-harm behaviors  Current Mental Status by Physician: NA   Pt states she is ready for d/c. Her tooth broke last night and pt wants to get it fixed. Pt slept well and states meds are helping. Denies depression and hopelessness. Denies SI/HI and denies AVH. Anhedonia is improving. Anxiety is a little increased due to her teeth issue. Taking meds as prescribed and feels a little lightheaded. Denies dizziness.    Loss Factors: Decline in physical health  Historical Factors: NA  Risk Reduction Factors:   Sense of responsibility to family, Living with another person, especially a relative, Positive social support and Positive therapeutic relationship  Pt reports access to guns that are locked. Recommended to pt that the gun be removed from her presence.   Continued Clinical Symptoms:  Previous Psychiatric Diagnoses and Treatments  Cognitive Features That Contribute To Risk:  Polarized thinking    Suicide Risk:  Minimal: No identifiable suicidal ideation.  Patients presenting with no risk factors but with morbid ruminations; may be classified as minimal risk based on the severity of the depressive symptoms  Principal Problem: Major depressive disorder, recurrent episode, severe [facility]) Discharge Diagnoses:  Patient Active Problem List   Diagnosis Date Noted  . Severe major depression without psychotic features ([place]) [F32.2] 04/06/2015  . Major depressive disorder, recurrent episode, severe ([place]) [F33.2] 04/06/2015  . Medication side effect [T88.7XXA]  03/26/2015  . PREGNANCY-INDUCED HYPERTENSION [IMO0002] 06/28/2010  . GESTATIONAL DIABETES [O99.810] 06/28/2010  . DYSPNEA ON EXERTION [R06.09, R09.89] 06/28/2010  . OBESITY [E66.9]  09/14/2009  . CONSTIPATION [K59.00] 09/14/2009  . NAUSEA AND VOMITING [R11.2] 09/14/2009  . NAUSEA ALONE [R11.0] 09/14/2009  . FLATULENCE [R14.3, R14.1, R14.2] 09/14/2009  . ABDOMINAL PAIN, GENERALIZED [R10.84] 09/14/2009  . INTESTINAL MALABSORPTION, POSTSURGICAL [K91.2] 06/10/2008  . SKIN LESIONS, MULTIPLE [L98.9] 06/10/2008  . PRIOR LAPAROSCOPIC ROUX-EN-BARIATRIC SURGERY [Z98.84] 03/17/2008  . ANEMIA-UNSPECIFIED [D64.9] 02/13/2008  . IRRITABLE BOWEL SYNDROME [K58.9] 02/13/2008  . ABDOMINAL PAIN-MULTIPLE SITES [R10.9] 02/13/2008    Follow-up Information    Follow up with [facility] On 04/13/2015.   Why:  at 1:00pm for therapy with Vidal Schwalbe   Contact information:   [address] [place], [place]. [address] [phone] Fax: [phone]      Follow up with [facility] On 04/17/2015.   Why:  at 2:00pm for medication management with Dr. Nicholes Mango information:   [phone] [place], [place]. [address] [phone] Fax: [phone]      Plan Of Care/Follow-up recommendations:  Activity:  as tolerated Diet:  resume normal diet Other:  take meds as prescribed. f/up with psychiatrist and medical doctor. avoid illicit drugs and alcohol    Continue Cymbalta to 60 mgrs QDAY for depression and anxiety- may also help chronic pain  Neurontin 300 mgrs BID and 900 mgrs QHS for management of pain and anxiety- increased QHS dose may also help insomnia. Continue Klonopin 0.5 mgrs QAM and 1 mgr QHS for anxiety   Is patient on multiple antipsychotic therapies at discharge:  No   Has Patient had three or more failed trials of antipsychotic monotherapy by history:  No  Recommended Plan for Multiple Antipsychotic Therapies: NA    Arvilla Salada 04/12/2015, 7:53 AM
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
## 2015-04-12 NOTE — ED Notes (Signed)
Pt denies SI and HI

## 2015-04-12 NOTE — ED Notes (Signed)
Crackers and cheese and soft drink given.

## 2015-04-12 NOTE — ED Notes (Signed)
Pt. Noted in room. No complaints or concerns voiced. No distress or abnormal behavior noted. Will continue to monitor with security cameras. Q 15 minute rounds continue. 

## 2015-04-12 NOTE — [facility] Group Notes (Signed)
[facility] Group Notes: (Clinical Social Work)   04/12/2015      Type of Therapy:  Group Therapy   Participation Level:  Did Not Attend was discharged just prior to group  Selmer Dominion, LCSW 04/12/2015, 3:31 PM

## 2015-04-12 NOTE — ED Notes (Signed)
Sandwich and soft drink given.  

## 2015-04-12 NOTE — BH Assessment (Signed)
Assessment completed. Consulted Arlester Marker, NP who recommended that pt be evaluated by psychiatry in the morning. Informed Dr. Thomasene Lot of the recommendation.

## 2015-04-12 NOTE — Progress Notes (Signed)
Pt is readied for DC as MD completes DC SRA and DC order. Pt completed daily assessment and on it she wrote she denied having SI today and  She rated her depression, hopelessness and anxiety " 0/0/2", respectively. She is given her DC AVS, its contents are reviewed with her and she stated understanding and willingness t comply. All belongings are returned to her and she was escorted to bldg entrance and dc'd  Per poc.

## 2015-04-12 NOTE — ED Provider Notes (Signed)
CSN: [id number]     Arrival date & time 04/12/15  1645 History   First MD Initiated Contact with Patient 04/12/15 1802     Chief Complaint  Patient presents with  . Medical Clearance     (Consider location/radiation/quality/duration/timing/severity/associated sxs/prior Treatment) HPI   She is a 21 her old female who left [facility] this morning. She reports that she went home and felt overwhelmed at home because she was unable to fill her prescription and she felt overwhelmed by the family members that were yelling at her. She reports being suicidal. She also says that she broke a tooth.  Patient denies taking anything today.  Past Medical History  Diagnosis Date  . Hernia   . IBS (irritable bowel syndrome)   . Ulcer   . Thyroid disease   . PIH (pregnancy induced hypertension)   . Asthma   . Gastroparesis   . Pancreatitis   . Partial bowel obstruction ([place])   . GERD (gastroesophageal reflux disease)   . DDD (degenerative disc disease), cervical   . Recurrent oral ulcers   . Lumbar herniated disc    Past Surgical History  Procedure Laterality Date  . Gastric bypass    . Cholecystectomy    . Tubal ligation    . Endometrial ablation    . Hernia repair    . Back surgery    . Cesarean section     Family History  Problem Relation Age of Onset  . Family history unknown: Yes   Social History  Substance Use Topics  . Smoking status: Never Smoker   . Smokeless tobacco: Never Used  . Alcohol Use: No     Comment:     OB History    No data available     Review of Systems  Constitutional: Negative for activity change.  Respiratory: Negative for shortness of breath.   Cardiovascular: Negative for chest pain.  Gastrointestinal: Negative for abdominal pain.  Psychiatric/Behavioral: Positive for self-injury. The patient is nervous/anxious.       Allergies  Amoxicillin; Avelox; Doxycycline; Morphine; Penicillins; Salmon; and Moxifloxacin  Home Medications   Prior to  Admission medications   Medication Sig Start Date End Date Taking? Authorizing Provider  albuterol (PROVENTIL HFA;VENTOLIN HFA) 108 (90 BASE) MCG/ACT inhaler Inhale 1-2 puffs into the lungs every 6 (six) hours as needed for wheezing or shortness of breath.   Yes Historical Provider, MD  amphetamine-dextroamphetamine (ADDERALL) 20 MG tablet Take 20 mg by mouth 3 (three) times daily. 02/20/15  Yes Historical Provider, MD  clonazePAM (KLONOPIN) 0.5 MG tablet Take 1 tablet by mouth (0.5mg ) in morning, Take 2 tablets by mouth (1 mg) at bedtime 04/12/15  Yes Derrill Center, NP  docusate sodium (COLACE) 50 MG capsule Take 1 capsule (50 mg total) by mouth daily as needed for mild constipation. 04/12/15  Yes Derrill Center, NP  DULoxetine (CYMBALTA) 60 MG capsule Take 1 capsule (60 mg total) by mouth daily. 04/12/15  Yes Derrill Center, NP  fluticasone (FLONASE) 50 MCG/ACT nasal spray Place 2 sprays into both nostrils 2 (two) times daily.   Yes Historical Provider, MD  levothyroxine (SYNTHROID, LEVOTHROID) 50 MCG tablet Take 50 mcg by mouth daily before breakfast.   Yes Historical Provider, MD  Linaclotide (LINZESS) 145 MCG CAPS capsule Take 145 mcg by mouth daily as needed (severe constipation).    Yes Historical Provider, MD  loratadine (CLARITIN) 10 MG tablet Take 1 tablet (10 mg total) by mouth daily. 04/12/15  Yes  Derrill Center, NP  pantoprazole (PROTONIX) 20 MG tablet Take 1 tablet (20 mg total) by mouth daily. 04/12/15  Yes Derrill Center, NP  buPROPion (WELLBUTRIN XL) 150 MG 24 hr tablet Take 150 mg by mouth daily. 04/03/15   Historical Provider, MD  gabapentin (NEURONTIN) 300 MG capsule Take 1 capsule (300 mg total) by mouth 2 (two) times daily. Patient taking differently: Take 300 mg by mouth. Take 300mg  in the morning, 300mg  at 3pm, and 600mg  at night 04/12/15   Derrill Center, NP   BP 133/84 mmHg  Pulse 94  Temp(Src) 98 F (36.7 C) (Oral)  Resp 18  SpO2 97% Physical Exam  Constitutional: She is  oriented to person, place, and time. She appears well-developed and well-nourished.  HENT:  Head: Normocephalic and atraumatic.  Eyes: Conjunctivae are normal. Right eye exhibits no discharge.  Neck: Neck supple.  Cardiovascular: Normal rate, regular rhythm and normal heart sounds.   No murmur heard. Pulmonary/Chest: Effort normal and breath sounds normal. She has no wheezes. She has no rales.  Abdominal: Soft. She exhibits no distension. There is no tenderness.  Musculoskeletal: Normal range of motion. She exhibits no edema.  Neurological: She is oriented to person, place, and time. No cranial nerve deficit.  Skin: Skin is warm and dry. No rash noted. She is not diaphoretic.  Psychiatric: Her behavior is normal.  Patient is suicidal. Her plan is to return in front of a car.  Nursing note and vitals reviewed.   ED Course  Procedures (including critical care time) Labs Review Labs Reviewed  COMPREHENSIVE METABOLIC PANEL - Abnormal; Notable for the following:    Total Protein 6.4 (*)    Albumin 3.4 (*)    Total Bilirubin 0.2 (*)    All other components within normal limits  CBC - Abnormal; Notable for the following:    RBC 3.77 (*)    Hemoglobin 9.7 (*)    HCT 30.9 (*)    MCH 25.7 (*)    RDW 16.4 (*)    All other components within normal limits  URINE RAPID DRUG SCREEN, HOSP PERFORMED - Abnormal; Notable for the following:    Benzodiazepines POSITIVE (*)    All other components within normal limits  ETHANOL    Imaging Review No results found. I have personally reviewed and evaluated these images and lab results as part of my medical decision-making.   EKG Interpretation None      MDM   Final diagnoses:  None   Patient is a 79 her old female with extensive psych history who was discharged from the [facility]  today. She reports being overwhelmed at home. She reports being suicidal with plan to run out in front of a car. She needs to be reevaluated by [facility] today .   Of  note patient says she recently had serotonin syndrome. In her ED psych orders I did not continue anything related to serotonin.  I am also not going to increase her pain medications. She can use Percocet every 6 hours but no more than that.  Yarianna Varble Julio Alm, MD 04/12/15 2028

## 2015-04-12 NOTE — ED Notes (Signed)
MD at bedside. 

## 2015-04-12 NOTE — ED Notes (Signed)
Pt being interviewed by TTS at this time in consult room

## 2015-04-12 NOTE — ED Notes (Signed)
Pt states she is in severe pain and percocet isn't helping,  I spoke with MD and she said no more pain medication at this time,  Pt is clapping hands, flapping legs, talking out loud saying maybe I am crazy and I just don't know it , states she went home today and got overwhelmed because her kids were wanting to do things for her and her medications she was released on from [facility] were incorrect

## 2015-04-13 DIAGNOSIS — F4321 Adjustment disorder with depressed mood: Secondary | ICD-10-CM | POA: Diagnosis not present

## 2015-04-13 DIAGNOSIS — Z79899 Other long term (current) drug therapy: Secondary | ICD-10-CM

## 2015-04-13 MED ORDER — GABAPENTIN 300 MG PO CAPS: 300.0000 mg | ORAL_CAPSULE | Freq: Three times a day (TID) | ORAL | Status: AC

## 2015-04-13 NOTE — Progress Notes (Addendum)
ED CM spoke with Peggy Austin at [facility] where pt informed Cm Dr Nelva Bush sees her actively for pain management  Discussed a follow up appt for back pain and neck stiffness  Pt has a pain contract  NP steve H 1530 today  Pt informed CM she was trying to get to [facility] because it was recommended on her d/c from [facility] on 04/12/15 instructions after she was seen for dental pain  ED CM explained to pt that [facility] was listed at a recommendation for follow up services on her [facility] D/c instructions- EPIC indicates pt has never been seen at [facility] Pt states [facility] but [facility] has been closed 3 years ago in 2013   Confirmed with Peggy Austin pt is not per the MD/NP/PA to receive any pain medication on d/c and will be assess at her appt  SAPPU NP and pt updated  1158 Cm called and spoke with Diane at [facility] that cm was transferred to when dialed [phone] to reach [facility] [place] [facility] [place] closed Pt was seeing Dr Corky Crafts and Peggy Austin confirms pt is still active and can see Dr Corky Crafts Pt last seen march 2016 and no showed for a May 2016 appt  Cm to allow pt to make her own appt to pcp and updated her with dr contact information

## 2015-04-13 NOTE — ED Notes (Signed)
Pt. Walking in hall crying. Pt. Redirected to her room to avoid waking up other patients. Ijeoma NP made aware of patients desire to "leave". Pt. Informed that she can be discharged if she will contract for safety. Pt. States that she "wants care and cannot say she is no longer having SI". Pt. Informed that she can have her Ativan in approx. 30 minutes and the Percocet in 2 1/2 hours. Pt. Indicates she understands.

## 2015-04-13 NOTE — Progress Notes (Signed)
Follow-up With Details Why Contact Info  Suella Broad Go on 04/13/2015 You have a pain management appointment to see the PA, Richardson Landry Today 04/13/15 at 3:30 pm [place] [address] [place] Schedule an appointment as soon as possible for a visit on 04/13/2015 You are still able to return to see Deeds at the [place] office Diane in the office states you can call and make an appointment  [address] [place], [place] [address] Phone: [phone] or  [phone]

## 2015-04-13 NOTE — Consult Note (Addendum)
[facility] Consult   Reason for Consult:  Medication issues Referring Physician:  EDP Patient Identification: Peggy Austin MRN:  [id number] Principal Diagnosis: Adjustment disorder with depressed mood Diagnosis:   Patient Active Problem List   Diagnosis Date Noted  . Medication management [Z79.899] 04/13/2015    Priority: High  . Adjustment disorder with depressed mood [F43.21] 04/13/2015    Priority: High  . Severe major depression without psychotic features ([place]) [F32.2] 04/06/2015    Priority: High  . Severe episode of recurrent major depressive disorder, without psychotic features ([place]) [F33.2]   . Major depressive disorder, recurrent episode, severe ([place]) [F33.2] 04/06/2015  . Medication side effect [T88.7XXA] 03/26/2015  . PREGNANCY-INDUCED HYPERTENSION [IMO0002] 06/28/2010  . GESTATIONAL DIABETES [O99.810] 06/28/2010  . DYSPNEA ON EXERTION [R06.09, R09.89] 06/28/2010  . OBESITY [E66.9] 09/14/2009  . CONSTIPATION [K59.00] 09/14/2009  . NAUSEA AND VOMITING [R11.2] 09/14/2009  . NAUSEA ALONE [R11.0] 09/14/2009  . FLATULENCE [R14.3, R14.1, R14.2] 09/14/2009  . ABDOMINAL PAIN, GENERALIZED [R10.84] 09/14/2009  . INTESTINAL MALABSORPTION, POSTSURGICAL [K91.2] 06/10/2008  . SKIN LESIONS, MULTIPLE [L98.9] 06/10/2008  . PRIOR LAPAROSCOPIC ROUX-EN-BARIATRIC SURGERY [Z98.84] 03/17/2008  . ANEMIA-UNSPECIFIED [D64.9] 02/13/2008  . IRRITABLE BOWEL SYNDROME [K58.9] 02/13/2008  . ABDOMINAL PAIN-MULTIPLE SITES [R10.9] 02/13/2008    Total Time spent with patient: 45 minutes  Subjective:   Peggy Austin is a 44 y.o. female patient does not warrant admission.  HPI:  44 yo female who left [facility] inpatient yesterday.  When she went to get her medications refilled, she noticed she did not get her third daily dosage of gabapentin (300 mg BID and 600 mg at bedtime).  Elva also wanted pain medications but did not receive any, which is protocol.  She was upset also because she  was only given a 7 day Rx for her Klonopin which is also protocol, since an appointment is in place within 7 days with her regular psychiatric provider who can give a month's Rx for a narcotic.  A pain management appointment was arranged for her today at 3:30 pm and information to a walk-in facility for psychiatric needs was given to the patient along with a Rx for 60 capsules of gabapentin for her bedtime dose.  Denies suicidal/homicidal ideations, hallucinations, and alcohol issues.  Past Psychiatric History: substance abuse, depression, anxiety  Risk to Self: Suicidal Ideation: Yes-Currently Present Suicidal Intent: No Is patient at risk for suicide?: No Suicidal Plan?: No (Pt reported fleeting thoughts of jumping in front of a car. ) Specify Current Suicidal Plan: Pt reported fleeting thoughts of jumping in front of a car.  Access to Means: Yes Specify Access to Suicidal Means: Pt has access to traffic.  What has been your use of drugs/alcohol within the last 12 months?: No alcohol or drug use reported.  How many times?: 0 Other Self Harm Risks: No self harm risk identified.  Triggers for Past Attempts: None known Intentional Self Injurious Behavior: None Risk to Others: Homicidal Ideation: No Thoughts of Harm to Others: No Current Homicidal Intent: No Current Homicidal Plan: No Access to Homicidal Means: No Identified Victim: N/A History of harm to others?: No Assessment of Violence: None Noted Violent Behavior Description: Pt reported that she will throw things when angry.  Does patient have access to weapons?: Yes (Comment) ("Glock and shot gun" ) Criminal Charges Pending?: No Does patient have a court date: No Prior Inpatient Therapy: Prior Inpatient Therapy: Yes Prior Therapy Dates: 03/2015 Prior Therapy Facilty/Provider(s): [facility]  Reason for  Treatment: Depression  Prior Outpatient Therapy: Prior Outpatient Therapy: Yes Prior Therapy Dates: 2014 Prior Therapy  Facilty/Provider(s): Pt cannot recall Reason for Treatment: Counseling Does patient have an ACCT team?: Unknown Does patient have Intensive In-House Services?  : No Does patient have [facility] services? : No Does patient have P4CC services?: No  Past Medical History:  Past Medical History  Diagnosis Date  . Hernia   . IBS (irritable bowel syndrome)   . Ulcer   . Thyroid disease   . PIH (pregnancy induced hypertension)   . Asthma   . Gastroparesis   . Pancreatitis   . Partial bowel obstruction ([place])   . GERD (gastroesophageal reflux disease)   . DDD (degenerative disc disease), cervical   . Recurrent oral ulcers   . Lumbar herniated disc     Past Surgical History  Procedure Laterality Date  . Gastric bypass    . Cholecystectomy    . Tubal ligation    . Endometrial ablation    . Hernia repair    . Back surgery    . Cesarean section     Family History:  Family History  Problem Relation Age of Onset  . Family history unknown: Yes   Family Psychiatric  History: depression Social History:  History  Alcohol Use No    Comment:       History  Drug Use No    Social History   Social History  . Marital Status: Divorced    Spouse Name: N/A  . Number of Children: N/A  . Years of Education: N/A   Social History Main Topics  . Smoking status: Never Smoker   . Smokeless tobacco: Never Used  . Alcohol Use: No     Comment:    . Drug Use: No  . Sexual Activity: Not Asked   Other Topics Concern  . None   Social History Narrative   Additional Social History:    History of alcohol / drug use?: No history of alcohol / drug abuse                     Allergies:   Allergies  Allergen Reactions  . Amoxicillin Rash    Has patient had a PCN reaction causing immediate rash, facial/tongue/throat swelling, SOB or lightheadedness with hypotension: unknown Has patient had a PCN reaction causing severe rash involving mucus membranes or skin necrosis: unknown Has  patient had a PCN reaction that required hospitalization no Has patient had a PCN reaction occurring within the last 10 years: yes If all of the above answers are "NO", then may proceed with Cephalosporin use.   . Avelox [Moxifloxacin Hcl In Nacl]     Rash   . Doxycycline Other (See Comments)    Abdominal pain  . Morphine Hives and Itching  . Penicillins Other (See Comments)    Childhood allergy Has patient had a PCN reaction causing immediate rash, facial/tongue/throat swelling, SOB or lightheadedness with hypotension: unknown Has patient had a PCN reaction causing severe rash involving mucus membranes or skin necrosis: unknown Has patient had a PCN reaction that required hospitalization unknown Has patient had a PCN reaction occurring within the last 10 years: no If all of the above answers are "NO", then may proceed with Cephalosporin use.   [facility] Hopes Allergy] Nausea And Vomiting    Projectile vomiting, catfish, scallops too  . Moxifloxacin Swelling and Rash    Labs:  Results for orders placed or performed during the hospital  encounter of 04/12/15 (from the past 48 hour(s))  Comprehensive metabolic panel     Status: Abnormal   Collection Time: 04/12/15  5:35 PM  Result Value Ref Range   Sodium 139 135 - 145 mmol/L   Potassium 4.5 3.5 - 5.1 mmol/L   Chloride 107 101 - 111 mmol/L   CO2 27 22 - 32 mmol/L   Glucose, Bld 73 65 - 99 mg/dL   BUN 19 6 - 20 mg/dL   Creatinine, Ser 0.53 0.44 - 1.00 mg/dL   Calcium 9.3 8.9 - 10.3 mg/dL   Total Protein 6.4 (L) 6.5 - 8.1 g/dL   Albumin 3.4 (L) 3.5 - 5.0 g/dL   AST 23 15 - 41 U/L   ALT 16 14 - 54 U/L   Alkaline Phosphatase 64 38 - 126 U/L   Total Bilirubin 0.2 (L) 0.3 - 1.2 mg/dL   GFR calc non Af Amer >60 >60 mL/min   GFR calc Af Amer >60 >60 mL/min    Comment: (NOTE) The eGFR has been calculated using the CKD EPI equation. This calculation has not been validated in all clinical situations. eGFR's persistently <60 mL/min  signify possible Chronic Kidney Disease.    Anion gap 5 5 - 15  Ethanol (ETOH)     Status: None   Collection Time: 04/12/15  5:35 PM  Result Value Ref Range   Alcohol, Ethyl (B) <5 <5 mg/dL    Comment:        LOWEST DETECTABLE LIMIT FOR SERUM ALCOHOL IS 5 mg/dL FOR MEDICAL PURPOSES ONLY   CBC     Status: Abnormal   Collection Time: 04/12/15  5:35 PM  Result Value Ref Range   WBC 5.8 4.0 - 10.5 K/uL   RBC 3.77 (L) 3.87 - 5.11 MIL/uL   Hemoglobin 9.7 (L) 12.0 - 15.0 g/dL   HCT 30.9 (L) 36.0 - 46.0 %   MCV 82.0 78.0 - 100.0 fL   MCH 25.7 (L) 26.0 - 34.0 pg   MCHC 31.4 30.0 - 36.0 g/dL   RDW 16.4 (H) 11.5 - 15.5 %   Platelets 281 150 - 400 K/uL  Urine rapid drug screen (hosp performed) (Not at [facility])     Status: Abnormal   Collection Time: 04/12/15  6:00 PM  Result Value Ref Range   Opiates NONE DETECTED NONE DETECTED   Cocaine NONE DETECTED NONE DETECTED   Benzodiazepines POSITIVE (A) NONE DETECTED   Amphetamines NONE DETECTED NONE DETECTED   Tetrahydrocannabinol NONE DETECTED NONE DETECTED   Barbiturates NONE DETECTED NONE DETECTED    Comment:        DRUG SCREEN FOR MEDICAL PURPOSES ONLY.  IF CONFIRMATION IS NEEDED FOR ANY PURPOSE, NOTIFY LAB WITHIN 5 DAYS.        LOWEST DETECTABLE LIMITS FOR URINE DRUG SCREEN Drug Class       Cutoff (ng/mL) Amphetamine      1000 Barbiturate      200 Benzodiazepine   131 Tricyclics       438 Opiates          300 Cocaine          300 THC              50     Current Facility-Administered Medications  Medication Dose Route Frequency Provider Last Rate Last Dose  . acetaminophen (TYLENOL) tablet 650 mg  650 mg Oral Q4H PRN Courteney Lyn Mackuen, MD   650 mg at 04/13/15 0759  . albuterol (PROVENTIL  HFA;VENTOLIN HFA) 108 (90 BASE) MCG/ACT inhaler 1-2 puff  1-2 puff Inhalation Q6H PRN Courteney Lyn Mackuen, MD      . clonazePAM (KLONOPIN) tablet 0.5 mg  0.5 mg Oral BID Courteney Lyn Mackuen, MD   0.5 mg at 04/13/15 1005  . docusate  sodium (COLACE) capsule 50 mg  50 mg Oral Daily PRN Courteney Lyn Mackuen, MD      . gabapentin (NEURONTIN) capsule 300 mg  300 mg Oral TID Courteney Lyn Mackuen, MD   300 mg at 04/13/15 1005  . levothyroxine (SYNTHROID, LEVOTHROID) tablet 50 mcg  50 mcg Oral QAC breakfast Courteney Lyn Mackuen, MD   50 mcg at 04/13/15 0752  . loratadine (CLARITIN) tablet 10 mg  10 mg Oral Daily Courteney Lyn Mackuen, MD   10 mg at 04/13/15 1005  . LORazepam (ATIVAN) tablet 1 mg  1 mg Oral Q8H PRN Courteney Lyn Mackuen, MD   1 mg at 04/13/15 0529  . ondansetron (ZOFRAN) tablet 4 mg  4 mg Oral Q8H PRN Courteney Lyn Mackuen, MD   4 mg at 04/13/15 0345  . ondansetron (ZOFRAN) tablet 4 mg  4 mg Oral Q6H PRN Courteney Lyn Mackuen, MD      . pantoprazole (PROTONIX) EC tablet 20 mg  20 mg Oral Daily Courteney Lyn Mackuen, MD   20 mg at 04/13/15 1005   Current Outpatient Prescriptions  Medication Sig Dispense Refill  . albuterol (PROVENTIL HFA;VENTOLIN HFA) 108 (90 BASE) MCG/ACT inhaler Inhale 1-2 puffs into the lungs every 6 (six) hours as needed for wheezing or shortness of breath.    . amphetamine-dextroamphetamine (ADDERALL) 20 MG tablet Take 20 mg by mouth 3 (three) times daily.  0  . clonazePAM (KLONOPIN) 0.5 MG tablet Take 1 tablet by mouth (0.1m) in morning, Take 2 tablets by mouth (1 mg) at bedtime 15 tablet 0  . docusate sodium (COLACE) 50 MG capsule Take 1 capsule (50 mg total) by mouth daily as needed for mild constipation. 10 capsule 0  . DULoxetine (CYMBALTA) 60 MG capsule Take 1 capsule (60 mg total) by mouth daily. 30 capsule 0  . fluticasone (FLONASE) 50 MCG/ACT nasal spray Place 2 sprays into both nostrils 2 (two) times daily.    .Marland Kitchenlevothyroxine (SYNTHROID, LEVOTHROID) 50 MCG tablet Take 50 mcg by mouth daily before breakfast.    . Linaclotide (LINZESS) 145 MCG CAPS capsule Take 145 mcg by mouth daily as needed (severe constipation).     .Marland Kitchenloratadine (CLARITIN) 10 MG tablet Take 1 tablet (10 mg total)  by mouth daily. 30 tablet 0  . ondansetron (ZOFRAN) 4 MG tablet Take 4 mg by mouth every 6 (six) hours as needed for nausea or vomiting.   0  . pantoprazole (PROTONIX) 20 MG tablet Take 1 tablet (20 mg total) by mouth daily. 30 tablet 0  . promethazine (PHENERGAN) 25 MG tablet Take 25 mg by mouth every 6 (six) hours as needed for nausea.   2  . buPROPion (WELLBUTRIN XL) 150 MG 24 hr tablet Take 150 mg by mouth daily.  1  . gabapentin (NEURONTIN) 300 MG capsule Take 1 capsule (300 mg total) by mouth 3 (three) times daily. Take 3047min the morning, 30029mt 3pm, and 600m31m night 60 capsule 0  . traMADol (ULTRAM) 50 MG tablet Take 50 mg by mouth 2 (two) times daily as needed.  0  . [DISCONTINUED] citalopram (CELEXA) 20 MG tablet Take 20 mg by mouth daily.      . [  DISCONTINUED] metFORMIN (GLUCOPHAGE) 500 MG tablet Take 500 mg by mouth 2 (two) times daily with a meal.        Musculoskeletal: Strength & Muscle Tone: within normal limits Gait & Station: normal Patient leans: N/A  Psychiatric Specialty Exam: Review of Systems  Constitutional: Negative.   HENT: Negative.   Eyes: Negative.   Respiratory: Negative.   Cardiovascular: Negative.   Gastrointestinal: Negative.   Genitourinary: Negative.   Musculoskeletal: Negative.   Skin: Negative.   Neurological: Negative.   Endo/Heme/Allergies: Negative.   Psychiatric/Behavioral:       Negative    Blood pressure 114/66, pulse 94, temperature 98.4 F (36.9 C), temperature source Oral, resp. rate 19, SpO2 100 %.There is no weight on file to calculate BMI.  General Appearance: Disheveled  Eye Contact::  Good  Speech:  Normal Rate  Volume:  Normal  Mood:  Anxious, mild  Affect:  Congruent  Thought Process:  Coherent  Orientation:  Full (Time, Place, and Person)  Thought Content:  WDL  Suicidal Thoughts:  No  Homicidal Thoughts:  No  Memory:  Immediate;   Good Recent;   Good Remote;   Good  Judgement:  Fair  Insight:  Fair   Psychomotor Activity:  Normal  Concentration:  Fair  Recall:  Good  Fund of Knowledge:Good  Language: Good  Akathisia:  No  Handed:  Right  AIMS (if indicated):     Assets:  Housing Leisure Time Physical Health Resilience Social Support  ADL's:  Intact  Cognition: WNL  Sleep:      Treatment Plan Summary: Daily contact with patient to assess and evaluate symptoms and progress in treatment, Medication management and Plan adjustment disorder with depressed mood: -pain clinic appointment made -resources for psychiatric care provided, walk-in basis -Rx for gabapentin 600 mg at bedtime for neuropathic pain -Individual counseling  Disposition: No evidence of imminent risk to self or others at present.    Waylan Boga, [place] 04/13/2015 12:52 PM Patient seen face-to-face for psychiatric evaluation, chart reviewed and case discussed with the physician extender and developed treatment plan. Reviewed the information documented and agree with the treatment plan. Corena Pilgrim, MD

## 2015-04-13 NOTE — ED Notes (Signed)
Pt. Noted sleeping in room. No complaints or concerns voiced. No distress or abnormal behavior noted. Will continue to monitor with security cameras. Q 15 minute rounds continue. 

## 2015-04-13 NOTE — [facility] Suicide Risk Assessment (Signed)
Suicide Risk Assessment  Discharge Assessment   [facility] Discharge Suicide Risk Assessment   Demographic Factors:  Caucasian  Total Time spent with patient: 45 minutes  Musculoskeletal: Strength & Muscle Tone: within normal limits Gait & Station: normal Patient leans: N/A  Psychiatric Specialty Exam: Review of Systems  Constitutional: Negative.  HENT: Negative.  Eyes: Negative.  Respiratory: Negative.  Cardiovascular: Negative.  Gastrointestinal: Negative.  Genitourinary: Negative.  Musculoskeletal: Negative.  Skin: Negative.  Neurological: Negative.  Endo/Heme/Allergies: Negative.  Psychiatric/Behavioral:   Negative    Blood pressure 114/66, pulse 94, temperature 98.4 F (36.9 C), temperature source Oral, resp. rate 19, SpO2 100 %.There is no weight on file to calculate BMI.  General Appearance: Disheveled  Eye Contact:: Good  Speech: Normal Rate  Volume: Normal  Mood: Anxious, mild  Affect: Congruent  Thought Process: Coherent  Orientation: Full (Time, Place, and Person)  Thought Content: WDL  Suicidal Thoughts: No  Homicidal Thoughts: No  Memory: Immediate; Good Recent; Good Remote; Good  Judgement: Fair  Insight: Fair  Psychomotor Activity: Normal  Concentration: Fair  Recall: Good  Fund of Knowledge:Good  Language: Good  Akathisia: No  Handed: Right  AIMS (if indicated):    Assets: Housing Leisure Time Physical Health Resilience Social Support  ADL's: Intact  Cognition: WNL  Sleep:          Has this patient used any form of tobacco in the last 30 days? (Cigarettes, Smokeless Tobacco, Cigars, and/or Pipes) No  Mental Status Per Nursing Assessment::   On Admission:   upset with her medications, depressed mood  Current Mental Status by Physician: NA  Loss Factors: NA  Historical Factors: NA  Risk Reduction Factors:   Sense of responsibility to family, Positive  social support and Positive therapeutic relationship  Continued Clinical Symptoms:  Anxiety, mild  Cognitive Features That Contribute To Risk:  None    Suicide Risk:  Minimal: No identifiable suicidal ideation.  Patients presenting with no risk factors but with morbid ruminations; may be classified as minimal risk based on the severity of the depressive symptoms  Principal Problem: Adjustment disorder with depressed mood Discharge Diagnoses:  Patient Active Problem List   Diagnosis Date Noted  . Medication management [Z79.899] 04/13/2015    Priority: High  . Adjustment disorder with depressed mood [F43.21] 04/13/2015    Priority: High  . Severe major depression without psychotic features ([place]) [F32.2] 04/06/2015    Priority: High  . Severe episode of recurrent major depressive disorder, without psychotic features ([place]) [F33.2]   . Major depressive disorder, recurrent episode, severe ([place]) [F33.2] 04/06/2015  . Medication side effect [T88.7XXA] 03/26/2015  . PREGNANCY-INDUCED HYPERTENSION [IMO0002] 06/28/2010  . GESTATIONAL DIABETES [O99.810] 06/28/2010  . DYSPNEA ON EXERTION [R06.09, R09.89] 06/28/2010  . OBESITY [E66.9] 09/14/2009  . CONSTIPATION [K59.00] 09/14/2009  . NAUSEA AND VOMITING [R11.2] 09/14/2009  . NAUSEA ALONE [R11.0] 09/14/2009  . FLATULENCE [R14.3, R14.1, R14.2] 09/14/2009  . ABDOMINAL PAIN, GENERALIZED [R10.84] 09/14/2009  . INTESTINAL MALABSORPTION, POSTSURGICAL [K91.2] 06/10/2008  . SKIN LESIONS, MULTIPLE [L98.9] 06/10/2008  . PRIOR LAPAROSCOPIC ROUX-EN-BARIATRIC SURGERY [Z98.84] 03/17/2008  . ANEMIA-UNSPECIFIED [D64.9] 02/13/2008  . IRRITABLE BOWEL SYNDROME [K58.9] 02/13/2008  . ABDOMINAL PAIN-MULTIPLE SITES [R10.9] 02/13/2008    Follow-up Information    Follow up with RAMOS,RICHARD D, MD. Go on 04/13/2015.   Specialty:  Physical Medicine and Rehabilitation   Why:  You have a pain management appointment to see the PA, Richardson Landry Today 04/13/15 at 3:30 pm    Contact information:  [address] [place] [address] [phone]       Follow up with Angelique Blonder . Schedule an appointment as soon as possible for a visit on 04/13/2015.   Why:  You are still able to return to see Deeds at the [place] office Diane in the office states you can call and make an appointment    Contact information:    [address] [place], [place] [address] Phone: [phone] or  [phone]       Call medicaid patients.   Contact information:   It is your responsibility to contact DSS each time you move to update your address You have access to medicaid transportation to yoru medical appointments call [phone], [phone] or [phone] 1-3 days prior to appt      Plan Of Care/Follow-up recommendations:  Activity:  as tolerated Diet:  heart healthy diet  Is patient on multiple antipsychotic therapies at discharge:  No   Has Patient had three or more failed trials of antipsychotic monotherapy by history:  No  Recommended Plan for Multiple Antipsychotic Therapies: NA    Lejend Dalby, PMH-NP 04/13/2015, 2:32 PM

## 2015-04-13 NOTE — BH Assessment (Signed)
[place] Assessment Progress Note  Per Corena Pilgrim, MD, this pt does not require psychiatric hospitalization at this time.  She is to be discharged from [facility] with recommendation to follow up with [facility] [place].  This has been included in pt's discharge instructions.  Pt's nurse, Jan, has been notified.  Jalene Mullet, [place] Triage Specialist [phone]

## 2015-04-13 NOTE — ED Notes (Signed)
Pt. Requesting Zofran to avoid nausea "from medications".

## 2015-04-13 NOTE — ED Notes (Signed)
D:Pt is alert and oriented. Pt talks excessively about recent hospitalization, serotonin syndrome and problems with her family. Pt c/o pain from broken tooth and chronic back pain. She is restless and fidgety. A:Offered support, encouragement and 15 minute checks. R:Safety maintained in the [facility].

## 2015-04-13 NOTE — ED Notes (Signed)
Pt. Requesting additional Ativan and Percocet. Pt. Made aware of the time for her next dose of both.

## 2015-04-13 NOTE — ED Notes (Signed)
Pt. Noted in room. Pt. Asking when she can have her anxiety meds. No distress or abnormal behavior noted. Will continue to monitor with security cameras. Q 15 minute rounds continue.

## 2015-04-13 NOTE — Discharge Instructions (Signed)
For your ongoing behavioral health needs you are advised to follow up with [facility].  New patients are seen at their walk-in clinic.  Walk-in hours are Monday - Friday from 8:00 am - 12:00 pm, and from 1:00 pm - 3:00 pm.  Walk-in patients are seen on a first come, first served basis, so try to arrive as early as possible for the best chance of being seen the same day.  There is an initial fee of $22.50: ° °     [facility] °     [address] °     [place], [place] [address] °     [phone] °

## 2015-04-16 LAB — VITAMIN D 1,25 DIHYDROXY
Vitamin D 1, 25 (OH)2 Total: 45 pg/mL
Vitamin D3 1, 25 (OH)2: 45 pg/mL

## 2015-05-14 ENCOUNTER — Other Ambulatory Visit (HOSPITAL_COMMUNITY): Payer: Self-pay | Admitting: Psychiatry

## 2015-06-11 ENCOUNTER — Emergency Department (HOSPITAL_COMMUNITY): Payer: Medicaid Other

## 2015-06-11 ENCOUNTER — Emergency Department (HOSPITAL_COMMUNITY)
Admission: EM | Admit: 2015-06-11 | Discharge: 2015-06-11 | Disposition: A | Discharge status: Home / Self Care | Payer: Medicaid Other | Attending: Emergency Medicine | Admitting: Emergency Medicine

## 2015-06-11 ENCOUNTER — Encounter (HOSPITAL_COMMUNITY): Payer: Self-pay | Admitting: Emergency Medicine

## 2015-06-11 DIAGNOSIS — E079 Disorder of thyroid, unspecified: Secondary | ICD-10-CM | POA: Insufficient documentation

## 2015-06-11 DIAGNOSIS — R05 Cough: Secondary | ICD-10-CM | POA: Diagnosis present

## 2015-06-11 DIAGNOSIS — K219 Gastro-esophageal reflux disease without esophagitis: Secondary | ICD-10-CM | POA: Diagnosis not present

## 2015-06-11 DIAGNOSIS — J45901 Unspecified asthma with (acute) exacerbation: Secondary | ICD-10-CM | POA: Diagnosis not present

## 2015-06-11 DIAGNOSIS — M5030 Other cervical disc degeneration, unspecified cervical region: Secondary | ICD-10-CM | POA: Diagnosis not present

## 2015-06-11 DIAGNOSIS — Z79899 Other long term (current) drug therapy: Secondary | ICD-10-CM | POA: Insufficient documentation

## 2015-06-11 DIAGNOSIS — Z7951 Long term (current) use of inhaled steroids: Secondary | ICD-10-CM | POA: Diagnosis not present

## 2015-06-11 DIAGNOSIS — J40 Bronchitis, not specified as acute or chronic: Secondary | ICD-10-CM

## 2015-06-11 DIAGNOSIS — Z880 Allergy status to penicillin: Secondary | ICD-10-CM | POA: Diagnosis not present

## 2015-06-11 MED ORDER — BENZONATATE 100 MG PO CAPS: 100.0000 mg | ORAL_CAPSULE | Freq: Three times a day (TID) | ORAL | Status: DC

## 2015-06-11 MED ORDER — ACETAMINOPHEN 500 MG PO TABS: 500.0000 mg | ORAL_TABLET | Freq: Four times a day (QID) | ORAL | Status: AC | PRN

## 2015-06-11 MED ORDER — ACETAMINOPHEN 325 MG PO TABS
650.0000 mg | ORAL_TABLET | Freq: Once | ORAL | Status: AC
  Administered 2015-06-11: 650 mg via ORAL
  Filled 2015-06-11: qty 2

## 2015-06-11 MED ORDER — CETIRIZINE HCL 10 MG PO TABS: 10.0000 mg | ORAL_TABLET | Freq: Every day | ORAL | Status: AC

## 2015-06-11 MED ORDER — ALBUTEROL SULFATE (2.5 MG/3ML) 0.083% IN NEBU
5.0000 mg | INHALATION_SOLUTION | Freq: Once | RESPIRATORY_TRACT | Status: AC
  Administered 2015-06-11: 5 mg via RESPIRATORY_TRACT
  Filled 2015-06-11: qty 6

## 2015-06-11 MED ORDER — IPRATROPIUM BROMIDE 0.02 % IN SOLN
0.5000 mg | Freq: Once | RESPIRATORY_TRACT | Status: AC
  Administered 2015-06-11: 0.5 mg via RESPIRATORY_TRACT
  Filled 2015-06-11: qty 2.5

## 2015-06-11 MED ORDER — ALBUTEROL SULFATE HFA 108 (90 BASE) MCG/ACT IN AERS: 2.0000 | INHALATION_SPRAY | RESPIRATORY_TRACT | Status: DC | PRN

## 2015-06-11 MED ORDER — ALBUTEROL (5 MG/ML) CONTINUOUS INHALATION SOLN
10.0000 mg/h | INHALATION_SOLUTION | Freq: Once | RESPIRATORY_TRACT | Status: DC
  Administered 2015-06-11: 10 mg/h via RESPIRATORY_TRACT
  Filled 2015-06-11 (×2): qty 20

## 2015-06-11 NOTE — ED Provider Notes (Signed)
CSN: [id number]     Arrival date & time 06/11/15  1156 History  By signing my name below, I, Meriel Pica, attest that this documentation has been prepared under the direction and in the presence of  Waynetta Pean PA-C.  Electronically Signed: Meriel Pica, ED Scribe. 06/11/2015. 1:49 PM.   Chief Complaint  Patient presents with  . Cough   The history is provided by the patient. No language interpreter was used.   HPI Comments: Peggy Austin is a 45 y.o. female, with a h/o asthma and seasonal allergies, who presents to the Emergency Department complaining of a constant, moderate cough that has been persistent for over 7 days, despite treatment with Z pack and a short course of steroids, both of which she finished yesterday. She associates post nasal drainage, throat 'tightness', chest tightness, and SOB. Pt has a home albuterol inhaler which she has used 2 times today without significant relief. Denies wheezing, sneezing, nausea, vomiting, diarrhea, or abdominal pain. She is tolerating secretions. No difficulty swallowing or dysphonia. Pt afebrile.   Past Medical History  Diagnosis Date  . Hernia   . IBS (irritable bowel syndrome)   . Ulcer   . Thyroid disease   . PIH (pregnancy induced hypertension)   . Asthma   . Gastroparesis   . Pancreatitis   . Partial bowel obstruction ([place])   . GERD (gastroesophageal reflux disease)   . DDD (degenerative disc disease), cervical   . Recurrent oral ulcers   . Lumbar herniated disc    Past Surgical History  Procedure Laterality Date  . Gastric bypass    . Cholecystectomy    . Tubal ligation    . Endometrial ablation    . Hernia repair    . Back surgery    . Cesarean section     Family History  Problem Relation Age of Onset  . Family history unknown: Yes   Social History  Substance Use Topics  . Smoking status: Never Smoker   . Smokeless tobacco: Never Used  . Alcohol Use: No     Comment:     OB History    No data available      Review of Systems  Constitutional: Negative for fever, chills and appetite change.  HENT: Positive for postnasal drip and sore throat ( described to be tight). Negative for trouble swallowing and voice change.   Eyes: Negative for pain and visual disturbance.  Respiratory: Positive for cough, chest tightness and shortness of breath. Negative for wheezing.   Cardiovascular: Negative for chest pain.  Gastrointestinal: Negative for nausea, vomiting, abdominal pain and diarrhea.  Genitourinary: Negative for dysuria.  Musculoskeletal: Negative for neck pain and neck stiffness.  Skin: Negative for rash.  Neurological: Negative for dizziness, syncope, light-headedness and headaches.   Allergies  Amoxicillin; Avelox; Doxycycline; Morphine; Penicillins; Salmon; and Moxifloxacin  Home Medications   Prior to Admission medications   Medication Sig Start Date End Date Taking? Authorizing Provider  acetaminophen (TYLENOL) 500 MG tablet Take 1 tablet (500 mg total) by mouth every 6 (six) hours as needed. 06/11/15   Waynetta Pean, PA-C  albuterol (PROVENTIL HFA;VENTOLIN HFA) 108 (90 Base) MCG/ACT inhaler Inhale 2 puffs into the lungs every 4 (four) hours as needed for wheezing or shortness of breath. 06/11/15   Waynetta Pean, PA-C  benzonatate (TESSALON) 100 MG capsule Take 1 capsule (100 mg total) by mouth every 8 (eight) hours. 06/11/15   Waynetta Pean, PA-C  cetirizine (ZYRTEC ALLERGY) 10 MG tablet Take  1 tablet (10 mg total) by mouth daily. 06/11/15   Waynetta Pean, PA-C  clonazePAM (KLONOPIN) 0.5 MG tablet Take 1 tablet by mouth (0.5mg ) in morning, Take 2 tablets by mouth (1 mg) at bedtime 04/12/15   Derrill Center, NP  docusate sodium (COLACE) 50 MG capsule Take 1 capsule (50 mg total) by mouth daily as needed for mild constipation. 04/12/15   Derrill Center, NP  DULoxetine (CYMBALTA) 60 MG capsule Take 1 capsule (60 mg total) by mouth daily. 04/12/15   Derrill Center, NP  fluticasone (FLONASE) 50  MCG/ACT nasal spray Place 2 sprays into both nostrils 2 (two) times daily.    Historical Provider, MD  gabapentin (NEURONTIN) 300 MG capsule Take 1 capsule (300 mg total) by mouth 3 (three) times daily. Take 300mg  in the morning, 300mg  at 3pm, and 600mg  at night 04/13/15   Patrecia Pour, NP  levothyroxine (SYNTHROID, LEVOTHROID) 50 MCG tablet Take 50 mcg by mouth daily before breakfast.    Historical Provider, MD  Linaclotide (LINZESS) 145 MCG CAPS capsule Take 145 mcg by mouth daily as needed (severe constipation).     Historical Provider, MD  loratadine (CLARITIN) 10 MG tablet Take 1 tablet (10 mg total) by mouth daily. 04/12/15   Derrill Center, NP  ondansetron (ZOFRAN) 4 MG tablet Take 4 mg by mouth every 6 (six) hours as needed for nausea or vomiting.  03/06/15   Historical Provider, MD  pantoprazole (PROTONIX) 20 MG tablet Take 1 tablet (20 mg total) by mouth daily. 04/12/15   Derrill Center, NP   BP 114/83 mmHg  Pulse 91  Temp(Src) 98.2 F (36.8 C) (Oral)  Resp 18  SpO2 100% Physical Exam  Constitutional: She is oriented to person, place, and time. She appears well-developed and well-nourished. No distress.  Non toxic appearing.   HENT:  Head: Normocephalic and atraumatic.  Right Ear: External ear normal.  Left Ear: External ear normal.  Mouth/Throat: Uvula is midline and mucous membranes are normal. Posterior oropharyngeal erythema present. No oropharyngeal exudate, posterior oropharyngeal edema or tonsillar abscesses.  Oropharynx mildly erythematous; no tonsillar hypertrophy or exudates. No posterior oropharyngeal edema. No drooling. Uvula is midline without edema. No trismus.   Eyes: Conjunctivae are normal. Pupils are equal, round, and reactive to light. Right eye exhibits no discharge. Left eye exhibits no discharge.  Neck: Neck supple.  Cardiovascular: Normal rate, regular rhythm, normal heart sounds and intact distal pulses.   Pulmonary/Chest: Effort normal and breath sounds  normal. No respiratory distress. She has no wheezes. She has no rales.  Lungs clear to auscultation bilaterally. No wheezes. No increased work of breathing.  Abdominal: Soft. There is no tenderness.  Musculoskeletal: She exhibits no edema or tenderness.  Lymphadenopathy:    She has no cervical adenopathy.  Neurological: She is alert and oriented to person, place, and time. Coordination normal.  Skin: Skin is warm and dry. No rash noted. She is not diaphoretic. No erythema. No pallor.  Psychiatric: She has a normal mood and affect. Her behavior is normal.  Nursing note and vitals reviewed.   ED Course  Procedures  DIAGNOSTIC STUDIES: Oxygen Saturation is 100% on RA, normal by my interpretation.    COORDINATION OF CARE: 1:48 PM Discussed treatment plan which includes to order an albuterol and ipratropium breathing treatment and tylenol 650mg  with pt. Pt acknowledges and agrees to plan.   Imaging Review Dg Chest 2 View  06/11/2015  CLINICAL DATA:  Cough EXAM: CHEST  2 VIEW COMPARISON:  02/19/2015 FINDINGS: The heart size and mediastinal contours are within normal limits. Both lungs are clear. The visualized skeletal structures are unremarkable. IMPRESSION: No active cardiopulmonary disease. Electronically Signed   By: Franchot Gallo M.D.   On: 06/11/2015 12:42   I have personally reviewed and evaluated these images as part of my medical decision-making.   MDM   Meds given in ED:  Medications  albuterol (PROVENTIL) (2.5 MG/3ML) 0.083% nebulizer solution 5 mg (5 mg Nebulization Given 06/11/15 1413)  ipratropium (ATROVENT) nebulizer solution 0.5 mg (0.5 mg Nebulization Given 06/11/15 1413)  acetaminophen (TYLENOL) tablet 650 mg (650 mg Oral Given 06/11/15 1413)    New Prescriptions   ACETAMINOPHEN (TYLENOL) 500 MG TABLET    Take 1 tablet (500 mg total) by mouth every 6 (six) hours as needed.   ALBUTEROL (PROVENTIL HFA;VENTOLIN HFA) 108 (90 BASE) MCG/ACT INHALER    Inhale 2 puffs into the  lungs every 4 (four) hours as needed for wheezing or shortness of breath.   BENZONATATE (TESSALON) 100 MG CAPSULE    Take 1 capsule (100 mg total) by mouth every 8 (eight) hours.   CETIRIZINE (ZYRTEC ALLERGY) 10 MG TABLET    Take 1 tablet (10 mg total) by mouth daily.    Final diagnoses:  Bronchitis   This is a 45 y.o. female, with a h/o asthma and seasonal allergies, who presents to the Emergency Department complaining of a constant, moderate cough that has been persistent for over 7 days, despite treatment with Z pack and a short course of steroids, both of which she finished yesterday. She associates post nasal drainage, throat 'tightness', chest tightness, and SOB. On exam the patient is afebrile nontoxic appearing. Her lungs are clear to auscultation bilaterally. Her oxygen saturation is 100% on room air. Chest x-ray is unremarkable. Patient's throat is clear. She has no tonsillar hypertrophy regular. She tolerated water and Tylenol and the emergency department without difficulty. She reports she only received a small amount of the initial nebulizer and so will repeat with albuterol and Atrovent. I re-evaluation patient reports she is feeling much better. She reports she is feeling ready for discharge. I reassured her that there is no signs of pneumonia on her chest x-ray or lung exam at this time. We'll discharge with prescriptions for Tylenol, albuterol, Tessalon and Zyrtec. She which is albuterol inhaler at home but will take the refill in case she needs one. I discussed strict return precautions. I encouraged her to follow-up with her primary care provider this week. I advised the patient to follow-up with their primary care provider this week. I advised the patient to return to the emergency department with new or worsening symptoms or new concerns. The patient verbalized understanding and agreement with plan.    This patient was discussed with Dr. Venora Maples who agrees with assessment and plan.    I personally performed the services described in this documentation, which was scribed in my presence. The recorded information has been reviewed and is accurate.    Waynetta Pean, PA-C 06/11/15 [place], MD 06/11/15 [phone]

## 2015-06-11 NOTE — ED Notes (Signed)
Per pt, states she has been treated for URI/brochitis-finished Z-pack and steroid taper yesterday-states she is still having a cough and doesn't feel better

## 2015-06-11 NOTE — Discharge Instructions (Signed)
Upper Respiratory Infection, Adult Most upper respiratory infections (URIs) are a viral infection of the air passages leading to the lungs. A URI affects the nose, throat, and upper air passages. The most common type of URI is nasopharyngitis and is typically referred to as "the common cold." URIs run their course and usually go away on their own. Most of the time, a URI does not require medical attention, but sometimes a bacterial infection in the upper airways can follow a viral infection. This is called a secondary infection. Sinus and middle ear infections are common types of secondary upper respiratory infections. Bacterial pneumonia can also complicate a URI. A URI can worsen asthma and chronic obstructive pulmonary disease (COPD). Sometimes, these complications can require emergency medical care and may be life threatening.  CAUSES Almost all URIs are caused by viruses. A virus is a type of germ and can spread from one person to another.  RISKS FACTORS You may be at risk for a URI if:   You smoke.   You have chronic heart or lung disease.  You have a weakened defense (immune) system.   You are very young or very old.   You have nasal allergies or asthma.  You work in crowded or poorly ventilated areas.  You work in health care facilities or schools. SIGNS AND SYMPTOMS  Symptoms typically develop 2-3 days after you come in contact with a cold virus. Most viral URIs last 7-10 days. However, viral URIs from the influenza virus (flu virus) can last 14-18 days and are typically more severe. Symptoms may include:   Runny or stuffy (congested) nose.   Sneezing.   Cough.   Sore throat.   Headache.   Fatigue.   Fever.   Loss of appetite.   Pain in your forehead, behind your eyes, and over your cheekbones (sinus pain).  Muscle aches.  DIAGNOSIS  Your health care provider may diagnose a URI by:  Physical exam.  Tests to check that your symptoms are not due to  another condition such as:  Strep throat.  Sinusitis.  Pneumonia.  Asthma. TREATMENT  A URI goes away on its own with time. It cannot be cured with medicines, but medicines may be prescribed or recommended to relieve symptoms. Medicines may help:  Reduce your fever.  Reduce your cough.  Relieve nasal congestion. HOME CARE INSTRUCTIONS   Take medicines only as directed by your health care provider.   Gargle warm saltwater or take cough drops to comfort your throat as directed by your health care provider.  Use a warm mist humidifier or inhale steam from a shower to increase air moisture. This may make it easier to breathe.  Drink enough fluid to keep your urine clear or pale yellow.   Eat soups and other clear broths and maintain good nutrition.   Rest as needed.   Return to work when your temperature has returned to normal or as your health care provider advises. You may need to stay home longer to avoid infecting others. You can also use a face mask and careful hand washing to prevent spread of the virus.  Increase the usage of your inhaler if you have asthma.   Do not use any tobacco products, including cigarettes, chewing tobacco, or electronic cigarettes. If you need help quitting, ask your health care provider. PREVENTION  The best way to protect yourself from getting a cold is to practice good hygiene.   Avoid oral or hand contact with people with cold   symptoms.   Wash your hands often if contact occurs.  There is no clear evidence that vitamin C, vitamin E, echinacea, or exercise reduces the chance of developing a cold. However, it is always recommended to get plenty of rest, exercise, and practice good nutrition.  SEEK MEDICAL CARE IF:   You are getting worse rather than better.   Your symptoms are not controlled by medicine.   You have chills.  You have worsening shortness of breath.  You have brown or red mucus.  You have yellow or brown nasal  discharge.  You have pain in your face, especially when you bend forward.  You have a fever.  You have swollen neck glands.  You have pain while swallowing.  You have white areas in the back of your throat. SEEK IMMEDIATE MEDICAL CARE IF:   You have severe or persistent:  Headache.  Ear pain.  Sinus pain.  Chest pain.  You have chronic lung disease and any of the following:  Wheezing.  Prolonged cough.  Coughing up blood.  A change in your usual mucus.  You have a stiff neck.  You have changes in your:  Vision.  Hearing.  Thinking.  Mood. MAKE SURE YOU:   Understand these instructions.  Will watch your condition.  Will get help right away if you are not doing well or get worse.   This information is not intended to replace advice given to you by your health care provider. Make sure you discuss any questions you have with your health care provider.   Document Released: 11/16/2000 Document Revised: 10/07/2014 Document Reviewed: 08/28/2013 Elsevier Interactive Patient Education 2016 [employer]  

## 2015-06-29 ENCOUNTER — Encounter (HOSPITAL_COMMUNITY): Payer: Self-pay

## 2015-06-29 ENCOUNTER — Emergency Department (HOSPITAL_COMMUNITY): Payer: Medicaid Other

## 2015-06-29 ENCOUNTER — Emergency Department (HOSPITAL_COMMUNITY)
Admission: EM | Admit: 2015-06-29 | Discharge: 2015-06-30 | Disposition: A | Discharge status: Home / Self Care | Payer: Medicaid Other | Attending: Emergency Medicine | Admitting: Emergency Medicine

## 2015-06-29 DIAGNOSIS — Z9851 Tubal ligation status: Secondary | ICD-10-CM | POA: Insufficient documentation

## 2015-06-29 DIAGNOSIS — Z8739 Personal history of other diseases of the musculoskeletal system and connective tissue: Secondary | ICD-10-CM | POA: Diagnosis not present

## 2015-06-29 DIAGNOSIS — K219 Gastro-esophageal reflux disease without esophagitis: Secondary | ICD-10-CM | POA: Insufficient documentation

## 2015-06-29 DIAGNOSIS — E079 Disorder of thyroid, unspecified: Secondary | ICD-10-CM | POA: Insufficient documentation

## 2015-06-29 DIAGNOSIS — Z880 Allergy status to penicillin: Secondary | ICD-10-CM | POA: Diagnosis not present

## 2015-06-29 DIAGNOSIS — Z9049 Acquired absence of other specified parts of digestive tract: Secondary | ICD-10-CM | POA: Diagnosis not present

## 2015-06-29 DIAGNOSIS — J45909 Unspecified asthma, uncomplicated: Secondary | ICD-10-CM | POA: Diagnosis not present

## 2015-06-29 DIAGNOSIS — K5900 Constipation, unspecified: Secondary | ICD-10-CM

## 2015-06-29 DIAGNOSIS — Z79899 Other long term (current) drug therapy: Secondary | ICD-10-CM | POA: Insufficient documentation

## 2015-06-29 DIAGNOSIS — R109 Unspecified abdominal pain: Secondary | ICD-10-CM | POA: Diagnosis present

## 2015-06-29 LAB — CBC
HEMATOCRIT: 33.5 % — AB (ref 36.0–46.0)
HEMOGLOBIN: 10.4 g/dL — AB (ref 12.0–15.0)
MCH: 25.1 pg — AB (ref 26.0–34.0)
MCHC: 31 g/dL (ref 30.0–36.0)
MCV: 80.7 fL (ref 78.0–100.0)
PLATELETS: 289 10*3/uL (ref 150–400)
RBC: 4.15 MIL/uL (ref 3.87–5.11)
RDW: 18.2 % — ABNORMAL HIGH (ref 11.5–15.5)
WBC: 3.6 10*3/uL — ABNORMAL LOW (ref 4.0–10.5)

## 2015-06-29 LAB — URINALYSIS, ROUTINE W REFLEX MICROSCOPIC
BILIRUBIN URINE: NEGATIVE
Glucose, UA: NEGATIVE mg/dL
HGB URINE DIPSTICK: NEGATIVE
KETONES UR: NEGATIVE mg/dL
Leukocytes, UA: NEGATIVE
Nitrite: NEGATIVE
Protein, ur: NEGATIVE mg/dL
Specific Gravity, Urine: 1.011 (ref 1.005–1.030)
pH: 5.5 (ref 5.0–8.0)

## 2015-06-29 LAB — COMPREHENSIVE METABOLIC PANEL
ALBUMIN: 3.4 g/dL — AB (ref 3.5–5.0)
ALK PHOS: 70 U/L (ref 38–126)
ALT: 11 U/L — ABNORMAL LOW (ref 14–54)
ANION GAP: 6 (ref 5–15)
AST: 17 U/L (ref 15–41)
BUN: 9 mg/dL (ref 6–20)
CO2: 24 mmol/L (ref 22–32)
Calcium: 9.4 mg/dL (ref 8.9–10.3)
Chloride: 109 mmol/L (ref 101–111)
Creatinine, Ser: 0.71 mg/dL (ref 0.44–1.00)
GFR calc Af Amer: >60 mL/min (ref >60)
GFR calc non Af Amer: >60 mL/min (ref >60)
GLUCOSE: 78 mg/dL (ref 65–99)
POTASSIUM: 4.4 mmol/L (ref 3.5–5.1)
SODIUM: 139 mmol/L (ref 135–145)
Total Bilirubin: 0.3 mg/dL (ref 0.3–1.2)
Total Protein: 6.1 g/dL — ABNORMAL LOW (ref 6.5–8.1)

## 2015-06-29 LAB — LIPASE, BLOOD: Lipase: 33 U/L (ref 11–51)

## 2015-06-29 MED ORDER — ONDANSETRON HCL 4 MG/2ML IJ SOLN
4.0000 mg | Freq: Once | INTRAMUSCULAR | Status: AC
  Administered 2015-06-29: 4 mg via INTRAVENOUS
  Filled 2015-06-29: qty 2

## 2015-06-29 MED ORDER — PROMETHAZINE HCL 25 MG/ML IJ SOLN
12.5000 mg | Freq: Once | INTRAMUSCULAR | Status: AC
  Administered 2015-06-29: 12.5 mg via INTRAVENOUS
  Filled 2015-06-29: qty 1

## 2015-06-29 MED ORDER — IOHEXOL 300 MG/ML  SOLN
100.0000 mL | Freq: Once | INTRAMUSCULAR | Status: AC | PRN
  Administered 2015-06-29: 100 mL via INTRAVENOUS

## 2015-06-29 MED ORDER — HYDROMORPHONE HCL 1 MG/ML IJ SOLN
1.0000 mg | Freq: Once | INTRAMUSCULAR | Status: AC
  Administered 2015-06-29: 1 mg via INTRAVENOUS
  Filled 2015-06-29: qty 1

## 2015-06-29 MED ORDER — FENTANYL CITRATE (PF) 100 MCG/2ML IJ SOLN
50.0000 ug | Freq: Once | INTRAMUSCULAR | Status: AC
  Administered 2015-06-29: 50 ug via INTRAVENOUS
  Filled 2015-06-29: qty 2

## 2015-06-29 MED ORDER — SODIUM CHLORIDE 0.9 % IV BOLUS (SEPSIS)
1000.0000 mL | Freq: Once | INTRAVENOUS | Status: AC
  Administered 2015-06-29: 1000 mL via INTRAVENOUS

## 2015-06-29 MED ORDER — FLEET ENEMA 7-19 GM/118ML RE ENEM
1.0000 | ENEMA | Freq: Once | RECTAL | Status: AC
  Administered 2015-06-30: 1 via RECTAL
  Filled 2015-06-29: qty 1

## 2015-06-29 NOTE — ED Provider Notes (Signed)
Patient seen and evaluated. Discussed with resident. Describes multiple episodes of abdominal pain before, and since the gastric bypass surgery 12 years ago. Has had several small bowel obstructions. Most treated nonoperatively. One treated with explode for laparotomy and lysis of adhesions. Current symptoms started several days ago. Patient not had a bowel movement for a few days. Nausea but no vomiting. Did pass gas here.  Pain is described as "an inch above my belly button, and down towards both eyes. Has a softly distended abdomen. Present bowel sounds without hyperactivity. No para irritation. Agree with CT scan imaging to rule out small bowel obstruction.  Tanna Furry, MD 06/29/15 2005

## 2015-06-29 NOTE — ED Notes (Signed)
Patient here with increasing abdominal pain and distention the past few days. Patient reports since gastric bypass has had increasing GI problems. Has had obstruction with same in past. No relief with laxative, vomiting with solid intake, keeping some fluids down.

## 2015-06-30 MED ORDER — POLYETHYLENE GLYCOL 3350 17 GM/SCOOP PO POWD: 34.0000 g | Freq: Two times a day (BID) | ORAL | Status: AC

## 2015-06-30 MED ORDER — HYDROCODONE-ACETAMINOPHEN 5-325 MG PO TABS: 1.0000 | ORAL_TABLET | ORAL | Status: DC | PRN

## 2015-06-30 NOTE — Discharge Instructions (Signed)

## 2015-06-30 NOTE — ED Provider Notes (Signed)
CSN: [id number]     Arrival date & time 06/29/15  1357 History   First MD Initiated Contact with Patient 06/29/15 1849     Chief Complaint  Patient presents with  . Abdominal Pain  . Bloated     (Consider location/radiation/quality/duration/timing/severity/associated sxs/prior Treatment) Patient is a 45 y.o. female presenting with abdominal pain.  Abdominal Pain Associated symptoms: constipation and nausea   Associated symptoms: no chest pain, no chills, no cough, no diarrhea, no dysuria, no fever, no shortness of breath and no vomiting    34 y f w PMH gastric bipass, previous bowel obstructions, gastroparesis, GERD, who presents with abdominal pain.  She presents with a few days now of mostly lower abdominal pain that starts around the belly button and radiates downward, feeling of fullness, severe in intensity.  Her last bowel movement was a few days ago and notes that she has urge to have a bowel movement but hasn't been able to.  She has had nausea but no vomiting.  No fever/chills.  She says that her current sx are similar to previous bowel obstructions.    Past Medical History  Diagnosis Date  . Hernia   . IBS (irritable bowel syndrome)   . Ulcer   . Thyroid disease   . PIH (pregnancy induced hypertension)   . Asthma   . Gastroparesis   . Pancreatitis   . Partial bowel obstruction ([place])   . GERD (gastroesophageal reflux disease)   . DDD (degenerative disc disease), cervical   . Recurrent oral ulcers   . Lumbar herniated disc    Past Surgical History  Procedure Laterality Date  . Gastric bypass    . Cholecystectomy    . Tubal ligation    . Endometrial ablation    . Hernia repair    . Back surgery    . Cesarean section     Family History  Problem Relation Age of Onset  . Family history unknown: Yes   Social History  Substance Use Topics  . Smoking status: Never Smoker   . Smokeless tobacco: Never Used  . Alcohol Use: No     Comment:     OB History    No  data available     Review of Systems  Constitutional: Negative for fever and chills.  HENT: Negative for nosebleeds.   Eyes: Negative for visual disturbance.  Respiratory: Negative for cough and shortness of breath.   Cardiovascular: Negative for chest pain.  Gastrointestinal: Positive for nausea, abdominal pain and constipation. Negative for vomiting and diarrhea.  Genitourinary: Negative for dysuria.  Skin: Negative for rash.  Neurological: Negative for weakness.  All other systems reviewed and are negative.     Allergies  Amoxicillin; Avelox; Doxycycline; Morphine; Penicillins; Salmon; and Moxifloxacin  Home Medications   Prior to Admission medications   Medication Sig Start Date End Date Taking? Authorizing Provider  acetaminophen (TYLENOL) 500 MG tablet Take 1 tablet (500 mg total) by mouth every 6 (six) hours as needed. Patient taking differently: Take 500 mg by mouth every 6 (six) hours as needed for mild pain.  06/11/15  Yes Waynetta Pean, PA-C  albuterol (PROVENTIL HFA;VENTOLIN HFA) 108 (90 Base) MCG/ACT inhaler Inhale 2 puffs into the lungs every 4 (four) hours as needed for wheezing or shortness of breath. 06/11/15  Yes Waynetta Pean, PA-C  cetirizine (ZYRTEC ALLERGY) 10 MG tablet Take 1 tablet (10 mg total) by mouth daily. 06/11/15  Yes Waynetta Pean, PA-C  clonazePAM (KLONOPIN) 0.5 MG tablet  Take 1 tablet by mouth (0.5mg ) in morning, Take 2 tablets by mouth (1 mg) at bedtime 04/12/15  Yes Derrill Center, NP  DULoxetine (CYMBALTA) 60 MG capsule Take 1 capsule (60 mg total) by mouth daily. 04/12/15  Yes Derrill Center, NP  gabapentin (NEURONTIN) 300 MG capsule Take 1 capsule (300 mg total) by mouth 3 (three) times daily. Take 300mg  in the morning, 300mg  at 3pm, and 600mg  at night 04/13/15  Yes Patrecia Pour, NP  levothyroxine (SYNTHROID, LEVOTHROID) 50 MCG tablet Take 50 mcg by mouth daily before breakfast.   Yes Historical Provider, MD  Linaclotide (LINZESS) 145 MCG CAPS  capsule Take 145 mcg by mouth daily as needed (severe constipation).    Yes Historical Provider, MD  ondansetron (ZOFRAN) 4 MG tablet Take 4 mg by mouth every 6 (six) hours as needed for nausea or vomiting.  03/06/15  Yes Historical Provider, MD  pantoprazole (PROTONIX) 20 MG tablet Take 1 tablet (20 mg total) by mouth daily. 04/12/15  Yes Derrill Center, NP  benzonatate (TESSALON) 100 MG capsule Take 1 capsule (100 mg total) by mouth every 8 (eight) hours. Patient not taking: Reported on 06/29/2015 06/11/15   Waynetta Pean, PA-C  docusate sodium (COLACE) 50 MG capsule Take 1 capsule (50 mg total) by mouth daily as needed for mild constipation. Patient not taking: Reported on 06/29/2015 04/12/15   Derrill Center, NP  HYDROcodone-acetaminophen (NORCO/VICODIN) 5-325 MG tablet Take 1 tablet by mouth every 4 (four) hours as needed. 06/30/15   Jarome Matin, MD  loratadine (CLARITIN) 10 MG tablet Take 1 tablet (10 mg total) by mouth daily. Patient not taking: Reported on 06/29/2015 04/12/15   Derrill Center, NP  polyethylene glycol powder (GLYCOLAX/MIRALAX) powder Take 34 g by mouth 2 (two) times daily. 06/30/15 07/07/15  Jarome Matin, MD   BP 130/78 mmHg  Pulse 84  Temp(Src) 98.9 F (37.2 C) (Oral)  Resp 18  Ht 5\' 2"  (1.575 m)  Wt 61.689 kg  BMI 24.87 kg/m2  SpO2 100%  LMP 06/15/2015 Physical Exam  Constitutional: She is oriented to person, place, and time. No distress.  HENT:  Head: Normocephalic and atraumatic.  Eyes: EOM are normal. Pupils are equal, round, and reactive to light.  Neck: Normal range of motion. Neck supple.  Cardiovascular: Normal rate and intact distal pulses.   Pulmonary/Chest: No respiratory distress.  Abdominal: Soft. There is tenderness (diffuse, moderate). There is no guarding.  Musculoskeletal: Normal range of motion.  Neurological: She is alert and oriented to person, place, and time.  Skin: No rash noted. She is not diaphoretic.  Psychiatric: She has a normal mood and  affect.    ED Course  Procedures (including critical care time) Labs Review Labs Reviewed  COMPREHENSIVE METABOLIC PANEL - Abnormal; Notable for the following:    Total Protein 6.1 (*)    Albumin 3.4 (*)    ALT 11 (*)    All other components within normal limits  CBC - Abnormal; Notable for the following:    WBC 3.6 (*)    Hemoglobin 10.4 (*)    HCT 33.5 (*)    MCH 25.1 (*)    RDW 18.2 (*)    All other components within normal limits  LIPASE, BLOOD  URINALYSIS, ROUTINE W REFLEX MICROSCOPIC (NOT AT [facility])    Imaging Review Ct Abdomen Pelvis W Contrast  06/29/2015  CLINICAL DATA:  Acute onset of periumbilical abdominal pain and nausea. Initial encounter. EXAM: CT ABDOMEN AND  PELVIS WITH CONTRAST TECHNIQUE: Multidetector CT imaging of the abdomen and pelvis was performed using the standard protocol following bolus administration of intravenous contrast. CONTRAST:  183mL OMNIPAQUE IOHEXOL 300 MG/ML  SOLN COMPARISON:  CT of the abdomen and pelvis from 01/22/2015 FINDINGS: The visualized lung bases are clear. The liver and spleen are unremarkable in appearance. The patient is status post cholecystectomy, with clips noted at the gallbladder fossa. The common bile duct is dilated to 1.8 cm, with mild prominence of the intrahepatic biliary ducts, likely still within normal limits status post cholecystectomy. The pancreas and adrenal glands are unremarkable. The kidneys are unremarkable in appearance. There is no evidence of hydronephrosis. No renal or ureteral stones are seen. No perinephric stranding is appreciated. No free fluid is identified. Postoperative change is noted about the gastroesophageal junction, with multiple bowel suture lines along the small bowel and about the gastrojejunal anastomosis. The distal stomach is largely decompressed, containing trace fluid. No acute vascular abnormalities are seen. The appendix is normal in caliber, without evidence for appendicitis. The transverse  colon is mildly redundant. The colon is largely filled with stool, with distention of the rectum to 8.9 cm with dense stool, concerning for mild constipation and some degree of fecal impaction. The bladder is mildly distended and grossly unremarkable. The uterus is unremarkable in appearance. The ovaries are relatively symmetric, with a 2.3 cm left adnexal follicle likely physiologic in nature. No inguinal lymphadenopathy is seen. No acute osseous abnormalities are identified. Mild vacuum phenomenon is noted at L5-S1, which can be within normal limits. IMPRESSION: 1. Colon largely filled with stool, with distention of the rectum to 8.9 cm with dense stool, compatible with mild constipation and some degree of fecal impaction. Would correlate with the patient's symptoms. 2. Dilatation of the common bile duct and mild prominence of the intrahepatic biliary ducts is likely within normal limits status post cholecystectomy. Electronically Signed   By: Garald Balding M.D.   On: 06/29/2015 22:25   I have personally reviewed and evaluated these images and lab results as part of my medical decision-making.   EKG Interpretation None      MDM   Final diagnoses:  Constipation, unspecified constipation type    56 y f w PMH gastric bipass, previous bowel obstructions, gastroparesis, GERD, who presents with abdominal pain.   Based off of hx and exam, concern for bowel obstruction.  Will obtain labs including cbc/cmp/lipase/ua. Will obtain ct abdomen/pelvis  Cbc w/o leukocytosis.  Cmp/lipase unremarkable. Have given multiple doses of pain medicines here in the ED with some improvement in her pain.  Ct shows large colonic stool burden with large amount of stool in the rectum w/o evidence of stercoral colitis or obstruction.  I have performed DRE and have attempted to break up the stool mass, I then placed a fleets enema.  Will start her on miralax 2 scoops bid and have her f/u with pcp.  I have discussed the  results, Dx and Tx plan with the pt. They expressed understanding and agree with the plan and were told to return to ED with any worsening of condition or concern.    Disposition: Discharge  Condition: Good  Discharge Medication List as of 06/30/2015 12:07 AM    START taking these medications   Details  HYDROcodone-acetaminophen (NORCO/VICODIN) 5-325 MG tablet Take 1 tablet by mouth every 4 (four) hours as needed., Starting 06/30/2015, Until Discontinued, Print    polyethylene glycol powder (GLYCOLAX/MIRALAX) powder Take 34 g by mouth 2 (  two) times daily., Starting 06/30/2015, Until Tue 07/07/15, Print        Follow Up: Katherina Mires, MD [address] [place] [address] [phone]  In 2 days    Pt seen in conjunction with Dr. Roosvelt Harps, MD 06/30/15 [place], MD 07/08/15 [phone]

## 2015-10-20 ENCOUNTER — Emergency Department (HOSPITAL_BASED_OUTPATIENT_CLINIC_OR_DEPARTMENT_OTHER)
Admission: EM | Admit: 2015-10-20 | Discharge: 2015-10-20 | Disposition: A | Discharge status: Home / Self Care | Payer: Medicaid Other | Attending: Emergency Medicine | Admitting: Emergency Medicine

## 2015-10-20 ENCOUNTER — Encounter (HOSPITAL_BASED_OUTPATIENT_CLINIC_OR_DEPARTMENT_OTHER): Payer: Self-pay

## 2015-10-20 DIAGNOSIS — J45909 Unspecified asthma, uncomplicated: Secondary | ICD-10-CM | POA: Diagnosis not present

## 2015-10-20 DIAGNOSIS — G43911 Migraine, unspecified, intractable, with status migrainosus: Secondary | ICD-10-CM | POA: Diagnosis present

## 2015-10-20 DIAGNOSIS — E079 Disorder of thyroid, unspecified: Secondary | ICD-10-CM | POA: Diagnosis not present

## 2015-10-20 HISTORY — DX: Tension-type headache, unspecified, not intractable: G44.209

## 2015-10-20 MED ORDER — DEXAMETHASONE SODIUM PHOSPHATE 10 MG/ML IJ SOLN
10.0000 mg | Freq: Once | INTRAMUSCULAR | Status: AC
  Administered 2015-10-20: 10 mg via INTRAVENOUS
  Filled 2015-10-20: qty 1

## 2015-10-20 MED ORDER — METOCLOPRAMIDE HCL 5 MG/ML IJ SOLN
10.0000 mg | Freq: Once | INTRAMUSCULAR | Status: AC
  Administered 2015-10-20: 10 mg via INTRAVENOUS
  Filled 2015-10-20: qty 2

## 2015-10-20 MED ORDER — DIPHENHYDRAMINE HCL 50 MG/ML IJ SOLN
25.0000 mg | Freq: Once | INTRAMUSCULAR | Status: AC
  Administered 2015-10-20: 25 mg via INTRAVENOUS
  Filled 2015-10-20: qty 1

## 2015-10-20 MED ORDER — SODIUM CHLORIDE 0.9 % IV BOLUS (SEPSIS)
1000.0000 mL | Freq: Once | INTRAVENOUS | Status: AC
  Administered 2015-10-20: 1000 mL via INTRAVENOUS

## 2015-10-20 NOTE — ED Notes (Signed)
Pt c/o HA with visual disturbance, n/v since 430am-was sent from [facility]-pt NAD-steady gait

## 2015-10-20 NOTE — ED Provider Notes (Signed)
CSN: [id number]     Arrival date & time 10/20/15  1759 History  By signing my name below, I, Evelene Croon, attest that this documentation has been prepared under the direction and in the presence of Blanchie Dessert, MD . Electronically Signed: Evelene Croon, Scribe. 10/20/2015. 7:19 PM.    Chief Complaint  Patient presents with  . Headache   The history is provided by the patient. No language interpreter was used.   HPI Comments:  Peggy Austin is a 45 y.o. female who presents to the Emergency Department complaining of 8/10 frontal HA that radiates to her left face and left neck which began ~ 0430 this AM. Pt reports history of tension HA; she notes her pain today is similar but her last episode was ~ 20 years ago. She reports associated nausea, vomiting, photophobia, and reports seeing a green haze. She denies speech changes and unilateral weakness/numbness. Pt notes she went to Urgent Care today for her pain and was advised to come to ED for further evaluation. No alleviating factors noted.  Past Medical History  Diagnosis Date  . Hernia   . IBS (irritable bowel syndrome)   . Ulcer   . Thyroid disease   . PIH (pregnancy induced hypertension)   . Asthma   . Gastroparesis   . Pancreatitis   . Partial bowel obstruction ([place])   . GERD (gastroesophageal reflux disease)   . DDD (degenerative disc disease), cervical   . Recurrent oral ulcers   . Lumbar herniated disc   . Tension headache    Past Surgical History  Procedure Laterality Date  . Gastric bypass    . Cholecystectomy    . Tubal ligation    . Endometrial ablation    . Hernia repair    . Back surgery    . Cesarean section     Family History  Problem Relation Age of Onset  . Family history unknown: Yes   Social History  Substance Use Topics  . Smoking status: Never Smoker   . Smokeless tobacco: Never Used  . Alcohol Use: No     Comment:     OB History    No data available     Review of Systems  Eyes:  Positive for photophobia and visual disturbance.  Gastrointestinal: Positive for nausea and vomiting.  Neurological: Positive for headaches. Negative for seizures, facial asymmetry, weakness and numbness.  All other systems reviewed and are negative.   Allergies  Amoxicillin; Avelox; Doxycycline; Morphine; Penicillins; Salmon; and Moxifloxacin  Home Medications   Prior to Admission medications   Medication Sig Start Date End Date Taking? Authorizing Provider  acetaminophen (TYLENOL) 500 MG tablet Take 1 tablet (500 mg total) by mouth every 6 (six) hours as needed. Patient taking differently: Take 500 mg by mouth every 6 (six) hours as needed for mild pain.  06/11/15   Waynetta Pean, PA-C  albuterol (PROVENTIL HFA;VENTOLIN HFA) 108 (90 Base) MCG/ACT inhaler Inhale 2 puffs into the lungs every 4 (four) hours as needed for wheezing or shortness of breath. 06/11/15   Waynetta Pean, PA-C  benzonatate (TESSALON) 100 MG capsule Take 1 capsule (100 mg total) by mouth every 8 (eight) hours. Patient not taking: Reported on 06/29/2015 06/11/15   Waynetta Pean, PA-C  cetirizine (ZYRTEC ALLERGY) 10 MG tablet Take 1 tablet (10 mg total) by mouth daily. 06/11/15   Waynetta Pean, PA-C  clonazePAM (KLONOPIN) 0.5 MG tablet Take 1 tablet by mouth (0.5mg ) in morning, Take 2 tablets by mouth (  1 mg) at bedtime 04/12/15   Derrill Center, NP  docusate sodium (COLACE) 50 MG capsule Take 1 capsule (50 mg total) by mouth daily as needed for mild constipation. Patient not taking: Reported on 06/29/2015 04/12/15   Derrill Center, NP  DULoxetine (CYMBALTA) 60 MG capsule Take 1 capsule (60 mg total) by mouth daily. 04/12/15   Derrill Center, NP  gabapentin (NEURONTIN) 300 MG capsule Take 1 capsule (300 mg total) by mouth 3 (three) times daily. Take 300mg  in the morning, 300mg  at 3pm, and 600mg  at night 04/13/15   Patrecia Pour, NP  HYDROcodone-acetaminophen (NORCO/VICODIN) 5-325 MG tablet Take 1 tablet by mouth every 4 (four) hours  as needed. 06/30/15   Jarome Matin, MD  levothyroxine (SYNTHROID, LEVOTHROID) 50 MCG tablet Take 50 mcg by mouth daily before breakfast.    Historical Provider, MD  Linaclotide (LINZESS) 145 MCG CAPS capsule Take 145 mcg by mouth daily as needed (severe constipation).     Historical Provider, MD  loratadine (CLARITIN) 10 MG tablet Take 1 tablet (10 mg total) by mouth daily. Patient not taking: Reported on 06/29/2015 04/12/15   Derrill Center, NP  ondansetron (ZOFRAN) 4 MG tablet Take 4 mg by mouth every 6 (six) hours as needed for nausea or vomiting.  03/06/15   Historical Provider, MD  pantoprazole (PROTONIX) 20 MG tablet Take 1 tablet (20 mg total) by mouth daily. 04/12/15   Derrill Center, NP   BP 156/95 mmHg  Pulse 92  Temp(Src) 99 F (37.2 C) (Oral)  Resp 18  Ht 5\' 2"  (1.575 m)  Wt 160 lb (72.576 kg)  BMI 29.26 kg/m2  SpO2 100%  LMP 10/06/2015 Physical Exam  Constitutional: She is oriented to person, place, and time. She appears well-developed and well-nourished. No distress.  HENT:  Head: Normocephalic and atraumatic.  Eyes: EOM are normal. Pupils are equal, round, and reactive to light.  Fundoscopic exam:      The right eye shows no papilledema.       The left eye shows no papilledema.  Neck: Normal range of motion. Neck supple.  Cardiovascular: Normal rate, regular rhythm, normal heart sounds and intact distal pulses.  Exam reveals no friction rub.   No murmur heard. Pulmonary/Chest: Effort normal and breath sounds normal. She has no wheezes. She has no rales.  Abdominal: Soft. Bowel sounds are normal. She exhibits no distension. There is no tenderness. There is no rebound and no guarding.  Musculoskeletal: Normal range of motion. She exhibits no tenderness.  No edema  Lymphadenopathy:    She has no cervical adenopathy.  Neurological: She is alert and oriented to person, place, and time. She has normal strength. No cranial nerve deficit or sensory deficit. Gait normal.  Skin:  Skin is warm and dry. No rash noted.  Psychiatric: She has a normal mood and affect. Her behavior is normal.  Nursing note and vitals reviewed.   ED Course  Procedures   DIAGNOSTIC STUDIES:  Oxygen Saturation is 100% on RA, normal by my interpretation.    COORDINATION OF CARE:  7:00 PM Discussed treatment plan with pt at bedside and pt agreed to plan.  Labs Review Labs Reviewed - No data to display  Imaging Review No results found. I have personally reviewed and evaluated these images and lab results as part of my medical decision-making.   MDM   Final diagnoses:  Intractable migraine with status migrainosus, unspecified migraine type   Pt with sx most  suggestive of  migraine HA without sx suggestive of SAH(sudden onset, worst of life, or deficits), infection, or cavernous vein thrombosis.  Normal neuro exam and vital signs. Will give HA cocktail and on re-eval pt feels much better.   I personally performed the services described in this documentation, which was scribed in my presence.  The recorded information has been reviewed and considered.    Blanchie Dessert, MD 10/20/15 1945

## 2015-10-20 NOTE — ED Notes (Signed)
Pt states she feels better and wants to go home, Dr Theora Gianotti notified

## 2015-10-20 NOTE — ED Notes (Signed)
MD at bedside. 

## 2015-10-20 NOTE — Discharge Instructions (Signed)

## 2016-06-25 ENCOUNTER — Emergency Department (HOSPITAL_BASED_OUTPATIENT_CLINIC_OR_DEPARTMENT_OTHER): Payer: Medicaid Other

## 2016-06-25 ENCOUNTER — Emergency Department (HOSPITAL_BASED_OUTPATIENT_CLINIC_OR_DEPARTMENT_OTHER)
Admission: EM | Admit: 2016-06-25 | Discharge: 2016-06-26 | Disposition: A | Discharge status: Home / Self Care | Payer: Medicaid Other | Attending: Emergency Medicine | Admitting: Emergency Medicine

## 2016-06-25 ENCOUNTER — Encounter (HOSPITAL_BASED_OUTPATIENT_CLINIC_OR_DEPARTMENT_OTHER): Payer: Self-pay | Admitting: Emergency Medicine

## 2016-06-25 DIAGNOSIS — R1010 Upper abdominal pain, unspecified: Secondary | ICD-10-CM | POA: Insufficient documentation

## 2016-06-25 DIAGNOSIS — J45909 Unspecified asthma, uncomplicated: Secondary | ICD-10-CM | POA: Insufficient documentation

## 2016-06-25 DIAGNOSIS — Y92410 Unspecified street and highway as the place of occurrence of the external cause: Secondary | ICD-10-CM | POA: Insufficient documentation

## 2016-06-25 DIAGNOSIS — R51 Headache: Secondary | ICD-10-CM | POA: Diagnosis not present

## 2016-06-25 DIAGNOSIS — Y999 Unspecified external cause status: Secondary | ICD-10-CM | POA: Insufficient documentation

## 2016-06-25 DIAGNOSIS — S3991XA Unspecified injury of abdomen, initial encounter: Secondary | ICD-10-CM | POA: Diagnosis present

## 2016-06-25 DIAGNOSIS — Z79899 Other long term (current) drug therapy: Secondary | ICD-10-CM | POA: Diagnosis not present

## 2016-06-25 DIAGNOSIS — Y939 Activity, unspecified: Secondary | ICD-10-CM | POA: Diagnosis not present

## 2016-06-25 LAB — CBC
HCT: 25.9 % — ABNORMAL LOW (ref 36.0–46.0)
Hemoglobin: 7.7 g/dL — ABNORMAL LOW (ref 12.0–15.0)
MCH: 21 pg — AB (ref 26.0–34.0)
MCHC: 29.7 g/dL — ABNORMAL LOW (ref 30.0–36.0)
MCV: 70.8 fL — ABNORMAL LOW (ref 78.0–100.0)
PLATELETS: 365 10*3/uL (ref 150–400)
RBC: 3.66 MIL/uL — ABNORMAL LOW (ref 3.87–5.11)
RDW: 19 % — AB (ref 11.5–15.5)
WBC: 6.3 10*3/uL (ref 4.0–10.5)

## 2016-06-25 LAB — COMPREHENSIVE METABOLIC PANEL
ALT: 11 U/L — ABNORMAL LOW (ref 14–54)
AST: 17 U/L (ref 15–41)
Albumin: 3.2 g/dL — ABNORMAL LOW (ref 3.5–5.0)
Alkaline Phosphatase: 77 U/L (ref 38–126)
Anion gap: 6 (ref 5–15)
BUN: 12 mg/dL (ref 6–20)
CHLORIDE: 105 mmol/L (ref 101–111)
CO2: 24 mmol/L (ref 22–32)
Calcium: 8.7 mg/dL — ABNORMAL LOW (ref 8.9–10.3)
Creatinine, Ser: 0.55 mg/dL (ref 0.44–1.00)
Glucose, Bld: 121 mg/dL — ABNORMAL HIGH (ref 65–99)
POTASSIUM: 4.5 mmol/L (ref 3.5–5.1)
Sodium: 135 mmol/L (ref 135–145)
Total Bilirubin: 0.2 mg/dL — ABNORMAL LOW (ref 0.3–1.2)
Total Protein: 6.3 g/dL — ABNORMAL LOW (ref 6.5–8.1)

## 2016-06-25 MED ORDER — IOPAMIDOL (ISOVUE-300) INJECTION 61%
100.0000 mL | Freq: Once | INTRAVENOUS | Status: AC | PRN
  Administered 2016-06-25: 100 mL via INTRAVENOUS

## 2016-06-25 MED ORDER — HYDROMORPHONE HCL 1 MG/ML IJ SOLN
0.5000 mg | Freq: Once | INTRAMUSCULAR | Status: AC
  Administered 2016-06-25: 0.5 mg via INTRAVENOUS
  Filled 2016-06-25: qty 1

## 2016-06-25 MED ORDER — ONDANSETRON HCL 4 MG/2ML IJ SOLN
4.0000 mg | Freq: Once | INTRAMUSCULAR | Status: AC
  Administered 2016-06-25: 4 mg via INTRAVENOUS
  Filled 2016-06-25: qty 2
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
## 2016-06-26 LAB — URINALYSIS, ROUTINE W REFLEX MICROSCOPIC
Bilirubin Urine: NEGATIVE
GLUCOSE, UA: NEGATIVE mg/dL
Hgb urine dipstick: NEGATIVE
Ketones, ur: NEGATIVE mg/dL
LEUKOCYTES UA: NEGATIVE
NITRITE: NEGATIVE
PH: 7 (ref 5.0–8.0)
Protein, ur: NEGATIVE mg/dL

## 2016-06-26 LAB — RAPID URINE DRUG SCREEN, HOSP PERFORMED
AMPHETAMINES: NOT DETECTED
BARBITURATES: NOT DETECTED
BENZODIAZEPINES: NOT DETECTED
COCAINE: NOT DETECTED
Opiates: POSITIVE — AB
TETRAHYDROCANNABINOL: NOT DETECTED

## 2016-06-26 LAB — OCCULT BLOOD X 1 CARD TO LAB, STOOL: Fecal Occult Bld: NEGATIVE

## 2016-06-26 MED ORDER — DICYCLOMINE HCL 10 MG/ML IM SOLN
20.0000 mg | Freq: Once | INTRAMUSCULAR | Status: AC
  Administered 2016-06-26: 20 mg via INTRAMUSCULAR
  Filled 2016-06-26: qty 2

## 2016-06-26 MED ORDER — METHOCARBAMOL 500 MG PO TABS
500.0000 mg | ORAL_TABLET | Freq: Once | ORAL | Status: AC
  Administered 2016-06-26: 500 mg via ORAL
  Filled 2016-06-26: qty 1

## 2016-06-26 MED ORDER — SUCRALFATE 1 GM/10ML PO SUSP: 1.0000 g | Freq: Three times a day (TID) | ORAL | 0 refills | Status: AC

## 2016-06-26 MED ORDER — HYDROMORPHONE HCL 1 MG/ML IJ SOLN
0.5000 mg | Freq: Once | INTRAMUSCULAR | Status: AC
  Administered 2016-06-26: 0.5 mg via INTRAVENOUS
  Filled 2016-06-26: qty 1

## 2016-06-26 MED ORDER — KETOROLAC TROMETHAMINE 30 MG/ML IJ SOLN
30.0000 mg | Freq: Once | INTRAMUSCULAR | Status: AC
  Administered 2016-06-26: 30 mg via INTRAVENOUS
  Filled 2016-06-26: qty 1

## 2016-06-26 MED ORDER — DICYCLOMINE HCL 20 MG PO TABS: 20.0000 mg | ORAL_TABLET | Freq: Two times a day (BID) | ORAL | 0 refills | Status: DC

## 2016-06-26 MED ORDER — MELOXICAM 7.5 MG PO TABS: 7.5000 mg | ORAL_TABLET | Freq: Every day | ORAL | 0 refills | Status: DC

## 2016-06-26 MED ORDER — LIDOCAINE 5 % EX PTCH: 1.0000 | MEDICATED_PATCH | CUTANEOUS | 0 refills | Status: DC

## 2016-06-26 NOTE — Discharge Instructions (Signed)
Take the medication as directed. Follow up with your PCP on Monday.

## 2016-06-26 NOTE — ED Notes (Signed)
Pt given d/c instructions as per chart. Rx x 3. Verbalizes understanding. No questions. 

## 2016-06-27 ENCOUNTER — Telehealth (HOSPITAL_BASED_OUTPATIENT_CLINIC_OR_DEPARTMENT_OTHER): Payer: Self-pay | Admitting: *Deleted

## 2016-06-27 NOTE — Telephone Encounter (Signed)
Spoke with Lolita Cram at [facility] regarding pt's Rx for lidoderm patches. Her insurance does not cover them and asks if there is a substitution. Chart reviewed by Dr. Alvino Chapel who states there is not a substitute.

## 2016-07-17 ENCOUNTER — Emergency Department (INDEPENDENT_AMBULATORY_CARE_PROVIDER_SITE_OTHER)
Admission: EM | Admit: 2016-07-17 | Discharge: 2016-07-17 | Disposition: A | Discharge status: Home / Self Care | Payer: Medicaid Other | Source: Home / Self Care | Attending: Emergency Medicine | Admitting: Emergency Medicine

## 2016-07-17 ENCOUNTER — Encounter: Payer: Self-pay | Admitting: Emergency Medicine

## 2016-07-17 DIAGNOSIS — J683 Other acute and subacute respiratory conditions due to chemicals, gases, fumes and vapors: Secondary | ICD-10-CM

## 2016-07-17 DIAGNOSIS — R0602 Shortness of breath: Secondary | ICD-10-CM | POA: Diagnosis not present

## 2016-07-17 MED ORDER — PREDNISONE 10 MG PO TABS: ORAL_TABLET | ORAL | 0 refills | Status: DC

## 2016-07-17 MED ORDER — ALBUTEROL SULFATE HFA 108 (90 BASE) MCG/ACT IN AERS: 2.0000 | INHALATION_SPRAY | RESPIRATORY_TRACT | 0 refills | Status: AC | PRN
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
## 2016-08-30 ENCOUNTER — Encounter (HOSPITAL_BASED_OUTPATIENT_CLINIC_OR_DEPARTMENT_OTHER): Payer: Self-pay

## 2016-08-30 ENCOUNTER — Emergency Department (HOSPITAL_BASED_OUTPATIENT_CLINIC_OR_DEPARTMENT_OTHER)
Admission: EM | Admit: 2016-08-30 | Discharge: 2016-08-31 | Disposition: A | Discharge status: Home / Self Care | Payer: Medicaid Other | Attending: Physician Assistant | Admitting: Physician Assistant

## 2016-08-30 ENCOUNTER — Emergency Department (HOSPITAL_BASED_OUTPATIENT_CLINIC_OR_DEPARTMENT_OTHER): Payer: Medicaid Other

## 2016-08-30 DIAGNOSIS — J45909 Unspecified asthma, uncomplicated: Secondary | ICD-10-CM | POA: Diagnosis not present

## 2016-08-30 DIAGNOSIS — K5900 Constipation, unspecified: Secondary | ICD-10-CM | POA: Diagnosis not present

## 2016-08-30 DIAGNOSIS — R1031 Right lower quadrant pain: Secondary | ICD-10-CM | POA: Diagnosis present

## 2016-08-30 DIAGNOSIS — Z79899 Other long term (current) drug therapy: Secondary | ICD-10-CM | POA: Diagnosis not present

## 2016-08-30 HISTORY — DX: Bipolar II disorder: F31.81

## 2016-08-30 LAB — URINALYSIS, ROUTINE W REFLEX MICROSCOPIC
BILIRUBIN URINE: NEGATIVE
GLUCOSE, UA: NEGATIVE mg/dL
HGB URINE DIPSTICK: NEGATIVE
KETONES UR: NEGATIVE mg/dL
Leukocytes, UA: NEGATIVE
Nitrite: NEGATIVE
PH: 6 (ref 5.0–8.0)
Protein, ur: NEGATIVE mg/dL
SPECIFIC GRAVITY, URINE: 1.027 (ref 1.005–1.030)

## 2016-08-30 LAB — COMPREHENSIVE METABOLIC PANEL
ALT: 19 U/L (ref 14–54)
AST: 24 U/L (ref 15–41)
Albumin: 3.4 g/dL — ABNORMAL LOW (ref 3.5–5.0)
Alkaline Phosphatase: 82 U/L (ref 38–126)
Anion gap: 7 (ref 5–15)
BUN: 17 mg/dL (ref 6–20)
CO2: 25 mmol/L (ref 22–32)
Calcium: 9.1 mg/dL (ref 8.9–10.3)
Chloride: 106 mmol/L (ref 101–111)
Creatinine, Ser: 0.59 mg/dL (ref 0.44–1.00)
GFR calc Af Amer: >60 mL/min (ref >60)
GFR calc non Af Amer: >60 mL/min (ref >60)
Glucose, Bld: 123 mg/dL — ABNORMAL HIGH (ref 65–99)
Potassium: 4 mmol/L (ref 3.5–5.1)
Sodium: 138 mmol/L (ref 135–145)
Total Bilirubin: 0.1 mg/dL — ABNORMAL LOW (ref 0.3–1.2)
Total Protein: 6.2 g/dL — ABNORMAL LOW (ref 6.5–8.1)

## 2016-08-30 LAB — CBC
HCT: 34.7 % — ABNORMAL LOW (ref 36.0–46.0)
Hemoglobin: 10.7 g/dL — ABNORMAL LOW (ref 12.0–15.0)
MCH: 24.9 pg — ABNORMAL LOW (ref 26.0–34.0)
MCHC: 30.8 g/dL (ref 30.0–36.0)
MCV: 80.9 fL (ref 78.0–100.0)
Platelets: 260 10*3/uL (ref 150–400)
RBC: 4.29 MIL/uL (ref 3.87–5.11)
WBC: 5.1 10*3/uL (ref 4.0–10.5)

## 2016-08-30 LAB — LIPASE, BLOOD: Lipase: 52 U/L — ABNORMAL HIGH (ref 11–51)

## 2016-08-30 LAB — PREGNANCY, URINE: Preg Test, Ur: NEGATIVE

## 2016-08-30 MED ORDER — IOPAMIDOL (ISOVUE-300) INJECTION 61%
100.0000 mL | Freq: Once | INTRAVENOUS | Status: AC | PRN
  Administered 2016-08-30: 100 mL via INTRAVENOUS

## 2016-08-30 MED ORDER — ONDANSETRON HCL 4 MG/2ML IJ SOLN
4.0000 mg | Freq: Once | INTRAMUSCULAR | Status: AC
  Administered 2016-08-30: 4 mg via INTRAVENOUS
  Filled 2016-08-30: qty 2

## 2016-08-30 MED ORDER — HYDROMORPHONE HCL 1 MG/ML IJ SOLN
1.0000 mg | Freq: Once | INTRAMUSCULAR | Status: AC
  Administered 2016-08-30: 1 mg via INTRAVENOUS
  Filled 2016-08-30: qty 1

## 2016-08-30 NOTE — ED Provider Notes (Signed)
[facility] DEPT [facility] Provider Note   CSN: [id number] Arrival date & time: 08/30/16  2004   By signing my name below, I, Eunice Blase, attest that this documentation has been prepared under the direction and in the presence of Montine Circle, PA-C. Electronically Signed: Eunice Blase, Scribe. 08/30/16. 10:01 PM.   History   Chief Complaint Chief Complaint  Patient presents with  . Abdominal Pain   The history is provided by the patient and medical records. No language interpreter was used.    HPI Comments: Peggy Austin is a 46 y.o. female with Hx of multiple abdominal surgeries and bowel obstructions who presents to the Emergency Department complaining of worsening, moderate to severe constipation x 6 days. She notes associated nausea, vomiting x 3-4 since yesterday, sharp bilateral lower abdominal pain, pressure like epigastric pain, increased urinary frequency, difficulty starting and stopping urinary flow and abdominal distention. Pt notes use of OTC laxatives and stool softeners without relief and an enema with mild relief. Pt denies dysuria and fever.  Past Medical History:  Diagnosis Date  . Asthma   . DDD (degenerative disc disease), cervical   . Gastroparesis   . GERD (gastroesophageal reflux disease)   . Hernia   . IBS (irritable bowel syndrome)   . Lumbar herniated disc   . Pancreatitis   . Partial bowel obstruction   . PIH (pregnancy induced hypertension)   . Recurrent oral ulcers   . Tension headache   . Thyroid disease   . Ulcer [facility])     Patient Active Problem List   Diagnosis Date Noted  . Medication management 04/13/2015  . Adjustment disorder with depressed mood 04/13/2015  . Severe episode of recurrent major depressive disorder, without psychotic features ([place])   . Severe major depression without psychotic features ([place]) 04/06/2015  . Major depressive disorder, recurrent episode, severe ([place]) 04/06/2015  . Medication side effect 03/26/2015  .  PREGNANCY-INDUCED HYPERTENSION 06/28/2010  . GESTATIONAL DIABETES 06/28/2010  . DYSPNEA ON EXERTION 06/28/2010  . OBESITY 09/14/2009  . CONSTIPATION 09/14/2009  . NAUSEA AND VOMITING 09/14/2009  . NAUSEA ALONE 09/14/2009  . FLATULENCE 09/14/2009  . ABDOMINAL PAIN, GENERALIZED 09/14/2009  . INTESTINAL MALABSORPTION, POSTSURGICAL 06/10/2008  . SKIN LESIONS, MULTIPLE 06/10/2008  . PRIOR LAPAROSCOPIC [place] SURGERY 03/17/2008  . ANEMIA-UNSPECIFIED 02/13/2008  . IRRITABLE BOWEL SYNDROME 02/13/2008  . ABDOMINAL PAIN-MULTIPLE SITES 02/13/2008    Past Surgical History:  Procedure Laterality Date  . BACK SURGERY    . CESAREAN SECTION    . CHOLECYSTECTOMY    . ENDOMETRIAL ABLATION    . GASTRIC BYPASS    . HERNIA REPAIR    . TUBAL LIGATION      OB History    No data available       Home Medications    Prior to Admission medications   Medication Sig Start Date End Date Taking? Authorizing Provider  albuterol (PROVENTIL HFA;VENTOLIN HFA) 108 (90 Base) MCG/ACT inhaler Inhale 2 puffs into the lungs every 4 (four) hours as needed for wheezing or shortness of breath. 06/11/15  Yes Waynetta Pean, PA-C  albuterol (PROVENTIL HFA;VENTOLIN HFA) 108 (90 Base) MCG/ACT inhaler Inhale 2 puffs into the lungs every 4 (four) hours as needed for wheezing or shortness of breath. 07/17/16  Yes Jacqulyn Cane, MD  cetirizine (ZYRTEC ALLERGY) 10 MG tablet Take 1 tablet (10 mg total) by mouth daily. 06/11/15  Yes Waynetta Pean, PA-C  gabapentin (NEURONTIN) 300 MG capsule Take 1 capsule (300 mg total) by mouth 3 (  three) times daily. Take 300mg  in the morning, 300mg  at 3pm, and 600mg  at night 04/13/15  Yes Patrecia Pour, NP  lidocaine (LIDODERM) 5 % Place 1 patch onto the skin daily. Remove & Discard patch within 12 hours or as directed by MD 06/26/16  Yes [name], NP  QUEtiapine (SEROQUEL) 400 MG tablet Take 400 mg by mouth at bedtime.   Yes Historical Provider, MD  sucralfate (CARAFATE) 1 GM/10ML  suspension Take 10 mLs (1 g total) by mouth 4 (four) times daily -  with meals and at bedtime. 06/26/16  Yes [place], NP  acetaminophen (TYLENOL) 500 MG tablet Take 1 tablet (500 mg total) by mouth every 6 (six) hours as needed. Patient taking differently: Take 500 mg by mouth every 6 (six) hours as needed for mild pain.  06/11/15   Waynetta Pean, PA-C  clonazePAM (KLONOPIN) 0.5 MG tablet Take 1 tablet by mouth (0.5mg ) in morning, Take 2 tablets by mouth (1 mg) at bedtime Patient not taking: Reported on 08/30/2016 04/12/15   Derrill Center, NP  dicyclomine (BENTYL) 20 MG tablet Take 1 tablet (20 mg total) by mouth 2 (two) times daily. Patient not taking: Reported on 08/30/2016 06/26/16   Ashley Murrain, NP  DULoxetine (CYMBALTA) 60 MG capsule Take 1 capsule (60 mg total) by mouth daily. Patient not taking: Reported on 08/30/2016 04/12/15   Derrill Center, NP  HYDROcodone-acetaminophen (NORCO/VICODIN) 5-325 MG tablet Take 1 tablet by mouth every 4 (four) hours as needed. Patient not taking: Reported on 08/30/2016 06/30/15   Jarome Matin, MD  levothyroxine (SYNTHROID, LEVOTHROID) 50 MCG tablet Take 50 mcg by mouth daily before breakfast.    Historical Provider, MD  Linaclotide [facility]) 145 MCG CAPS capsule Take 145 mcg by mouth daily as needed (severe constipation).     Historical Provider, MD  ondansetron (ZOFRAN) 4 MG tablet Take 4 mg by mouth every 6 (six) hours as needed for nausea or vomiting.  03/06/15   Historical Provider, MD  pantoprazole (PROTONIX) 20 MG tablet Take 1 tablet (20 mg total) by mouth daily. Patient not taking: Reported on 08/30/2016 04/12/15   Derrill Center, NP  predniSONE (DELTASONE) 10 MG tablet Take as directed for 6 days.--Take 6 on day 1, 5 on day 2, 4 on day 3, then 3 tablets on day 4, then 2 tablets on day 5, then 1 on day 6. 07/17/16   Jacqulyn Cane, MD    Family History Family History  Problem Relation Age of Onset  . Family history unknown: Yes    Social History Social  History  Substance Use Topics  . Smoking status: Never Smoker  . Smokeless tobacco: Never Used  . Alcohol use No     Comment:       Allergies   Amoxicillin; Avelox [moxifloxacin hcl in nacl]; Doxycycline; Morphine; Penicillins; Salmon [fish allergy]; and Moxifloxacin   Review of Systems Review of Systems  Constitutional: Negative for fever.  Gastrointestinal: Positive for abdominal distention, abdominal pain, constipation, nausea and vomiting. Negative for blood in stool.  Genitourinary: Positive for difficulty urinating and frequency. Negative for dysuria, flank pain, hematuria and vaginal bleeding.  All other systems reviewed and are negative.    Physical Exam Updated Vital Signs BP 132/84 (BP Location: Left Arm)   Pulse 77   Temp 97.9 F (36.6 C) (Oral)   Resp 18   Ht 5\' 2"  (1.575 m)   Wt 160 lb (72.6 kg)   LMP 08/11/2016  SpO2 100%   BMI 29.26 kg/m   Physical Exam  Constitutional: She is oriented to person, place, and time. She appears well-developed and well-nourished. No distress.  HENT:  Head: Normocephalic and atraumatic.  Eyes: EOM are normal.  Neck: Normal range of motion.  Cardiovascular: Normal rate, regular rhythm and normal heart sounds.   Pulmonary/Chest: Effort normal and breath sounds normal.  Abdominal: Soft. She exhibits no distension. There is no tenderness.  Musculoskeletal: Normal range of motion.  Neurological: She is alert and oriented to person, place, and time.  Skin: Skin is warm and dry.  Psychiatric: She has a normal mood and affect. Judgment normal.  Nursing note and vitals reviewed.    ED Treatments / Results  DIAGNOSTIC STUDIES: Oxygen Saturation is 100% on RA, normal by my interpretation.    COORDINATION OF CARE: 9:59 PM Discussed treatment plan with pt at bedside and pt agreed to plan. Will review blood work and order medications.  Labs (all labs ordered are listed, but only abnormal results are displayed) Labs Reviewed    LIPASE, BLOOD - Abnormal; Notable for the following:       Result Value   Lipase 52 (*)    All other components within normal limits  COMPREHENSIVE METABOLIC PANEL - Abnormal; Notable for the following:    Glucose, Bld 123 (*)    Total Protein 6.2 (*)    Albumin 3.4 (*)    Total Bilirubin 0.1 (*)    All other components within normal limits  URINALYSIS, ROUTINE W REFLEX MICROSCOPIC - Abnormal; Notable for the following:    APPearance CLOUDY (*)    All other components within normal limits  PREGNANCY, URINE  CBC    EKG  EKG Interpretation None       Radiology Ct Abdomen Pelvis W Contrast  Result Date: 08/31/2016 CLINICAL DATA:  Acute onset of upper abdominal pain and bloating. Constipation and nausea. Initial encounter. EXAM: CT ABDOMEN AND PELVIS WITH CONTRAST TECHNIQUE: Multidetector CT imaging of the abdomen and pelvis was performed using the standard protocol following bolus administration of intravenous contrast. CONTRAST:  177mL ISOVUE-300 IOPAMIDOL (ISOVUE-300) INJECTION 61% COMPARISON:  CT of the abdomen and pelvis from 06/25/2016 FINDINGS: Lower chest: The visualized lung bases are clear. Hepatobiliary: The liver is grossly unremarkable. The patient is status post cholecystectomy, with clips noted at the gallbladder fossa. The common bile duct is distended to 2.1 cm in maximal diameter. This may reflect the patient's cholecystectomy, though would correlate for any evidence of postcholecystectomy syndrome. There is prominence of the intrahepatic biliary ducts. Pancreas: The pancreas is within normal limits. Spleen: The spleen is unremarkable in appearance. Adrenals/Urinary Tract: The adrenal glands are unremarkable in appearance. The kidneys are within normal limits. There is no evidence of hydronephrosis. No renal or ureteral stones are identified. No perinephric stranding is seen. Stomach/Bowel: The patient is status post gastric bypass surgery. The gastrojejunal anastomosis is  grossly unremarkable. The small bowel is within normal limits. The appendix is normal in caliber, without evidence of appendicitis. The rectum is somewhat distended with stool, measuring 8.5 cm in transverse diameter, concerning for mild fecal impaction. The colon is otherwise unremarkable in appearance. Vascular/Lymphatic: The abdominal aorta is unremarkable in appearance. A retroaortic left renal vein is noted. The inferior vena cava is grossly unremarkable. No retroperitoneal lymphadenopathy is seen. No pelvic sidewall lymphadenopathy is identified. Reproductive: The bladder is mildly distended and grossly remarkable. The uterus is unremarkable in appearance. The ovaries are relatively symmetric. No suspicious  adnexal masses are seen. Other: No additional soft tissue abnormalities are seen. Musculoskeletal: No acute osseous abnormalities are identified. Facet disease is noted along the lower lumbar spine. The visualized musculature is unremarkable in appearance. IMPRESSION: 1. Rectum somewhat distended with stool, measuring 8.5 cm in transverse diameter, raising concern for mild fecal impaction. 2. Persistent diffuse dilatation of the common bile duct to 2.1 cm in maximal diameter, with underlying prominence of intrahepatic biliary ducts. This may simply reflect prior cholecystectomy. Would correlate for any evidence of postcholecystectomy syndrome. Electronically Signed   By: Garald Balding M.D.   On: 08/31/2016 00:11    Procedures Procedures (including critical care time)  Medications Ordered in ED Medications - No data to display   Initial Impression / Assessment and Plan / ED Course  I have reviewed the triage vital signs and the nursing notes.  Pertinent labs & imaging results that were available during my care of the patient were reviewed by me and considered in my medical decision making (see chart for details).     Patient with vague abdominal pain, she states that she feels constipated.  She has had bowel surgery in the past, and I question whether she is at risk for small bowel obstruction. Will order CT scan given current presentation. CT scan is consistent with constipation. I attempted fecal disimpaction, but was unsuccessful, as the stool was barely out of reach. I have given the patient a new bowel regimen, and have instructed her to follow-up with her primary care provider. Her vital signs are stable. She is stable and ready for discharge.  Final Clinical Impressions(s) / ED Diagnoses   Final diagnoses:  Constipation, unspecified constipation type    New Prescriptions New Prescriptions   DOCUSATE SODIUM (COLACE) 100 MG CAPSULE    Take 1 capsule (100 mg total) by mouth every 12 (twelve) hours.   POLYETHYLENE GLYCOL POWDER (GLYCOLAX/MIRALAX) POWDER    Take 17 g by mouth 2 (two) times daily. Until daily soft stools  OTC   SODIUM PHOSPHATE (FLEET) 7-19 GM/118ML ENEM    Place 133 mLs (1 enema total) rectally daily as needed for severe constipation.   I personally performed the services described in this documentation, which was scribed in my presence. The recorded information has been reviewed and is accurate.      Montine Circle, PA-C 08/31/16 0050    Courteney Julio Alm, MD 08/31/16 [phone]

## 2016-08-30 NOTE — ED Triage Notes (Signed)
Pt c/o upper abdominal pain and bloating, last normal bowel movement was a week ago.  Last night she started vomiting as well.  Pt has tried OTC meds for constipation and produced a small amount of stool.  Pt has a hx of bowel obstructions.

## 2016-08-31 MED ORDER — POLYETHYLENE GLYCOL 3350 17 GM/SCOOP PO POWD: 17.0000 g | Freq: Two times a day (BID) | ORAL | 0 refills | Status: DC

## 2016-08-31 MED ORDER — HYDROMORPHONE HCL 1 MG/ML IJ SOLN
1.0000 mg | Freq: Once | INTRAMUSCULAR | Status: AC
  Administered 2016-08-31: 1 mg via INTRAVENOUS
  Filled 2016-08-31: qty 1

## 2016-08-31 MED ORDER — FLEET ENEMA 7-19 GM/118ML RE ENEM: 1.0000 | ENEMA | Freq: Every day | RECTAL | 0 refills | Status: DC | PRN

## 2016-08-31 MED ORDER — DOCUSATE SODIUM 100 MG PO CAPS: 100.0000 mg | ORAL_CAPSULE | Freq: Two times a day (BID) | ORAL | 0 refills | Status: DC

## 2016-08-31 NOTE — ED Notes (Signed)
Pt verbalizes understanding of d/c instructions and denies any further needs at this time. 

## 2016-11-02 IMAGING — CT CT ABD-PELV W/ CM
2 of 5 series · 11 of 46 positions shown, 12 images · IV contrast (Iodine)
Comparison: CT of the abdomen and pelvis from 01/22/2015

CLINICAL DATA: Acute onset of periumbilical abdominal pain and
nausea. Initial encounter.

EXAM:
CT ABDOMEN AND PELVIS WITH CONTRAST
TECHNIQUE: Multidetector CT imaging of the abdomen and pelvis was performed
using the standard protocol following bolus administration of
intravenous contrast.
CONTRAST:  100mL OMNIPAQUE IOHEXOL 300 MG/ML  SOLN

[Series 201: routine, idose (2) · axial · 0.78mm/px · z∈[-479,-134]mm · 8 of 89 slices shown, 9 images]
[im 10/89  soft-tissue]
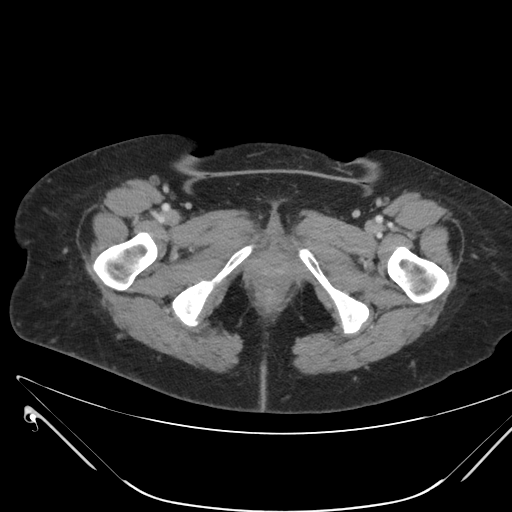
[im 10/89  bone]
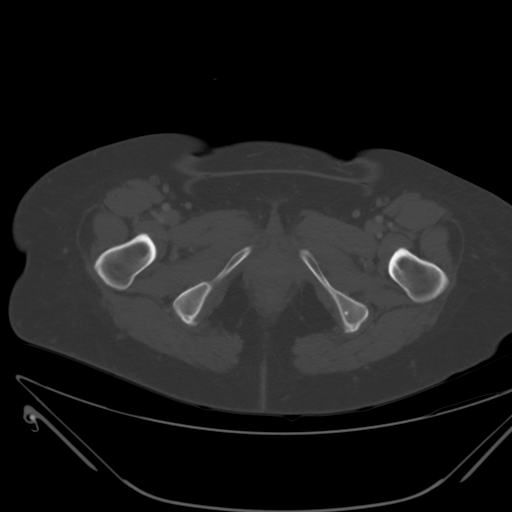
[im 19/89  soft-tissue]
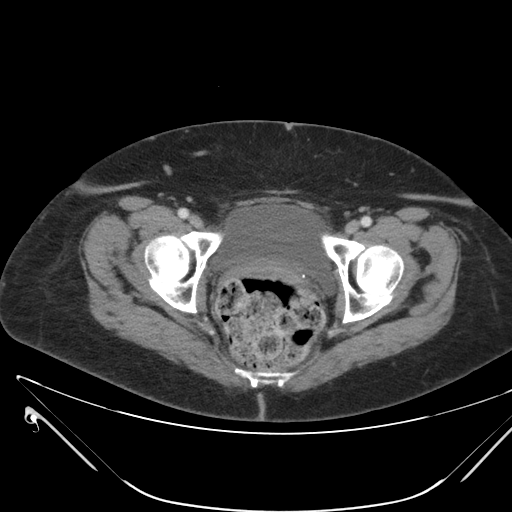
[im 28/89  soft-tissue]
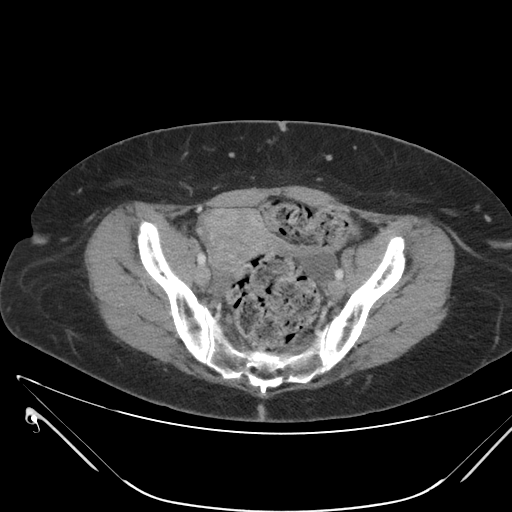
[im 38/89  soft-tissue]
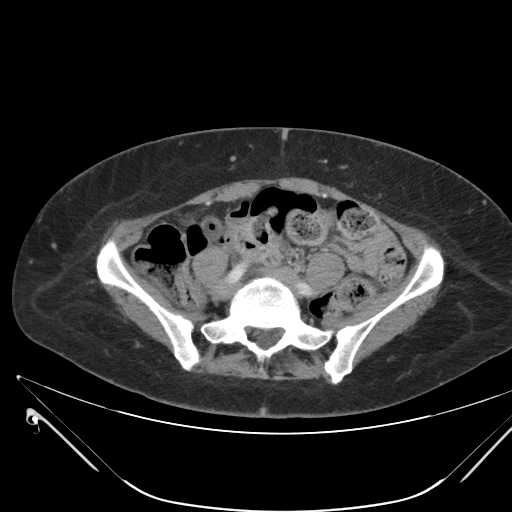
[im 51/89  soft-tissue]
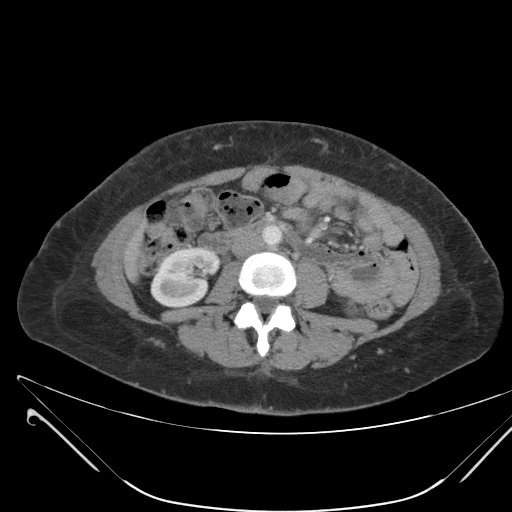
[im 61/89  soft-tissue]
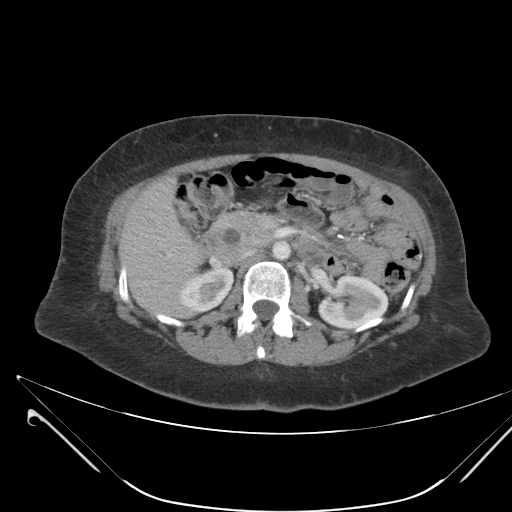
[im 70/89  soft-tissue]
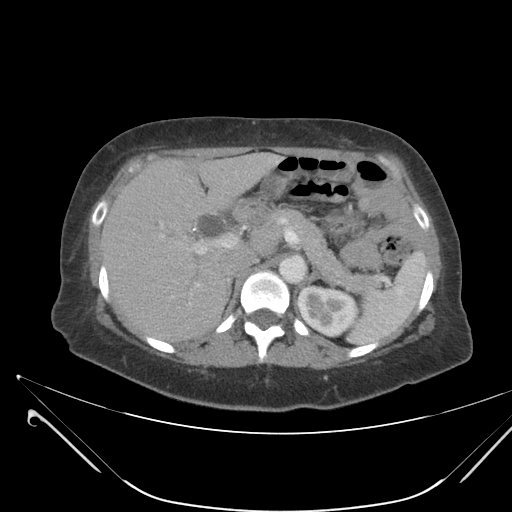
[im 79/89  soft-tissue]
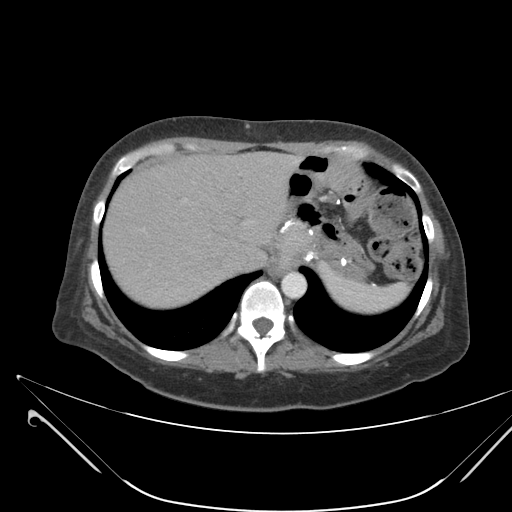

[Series 203: coronals, idose (2) · coronal · 0.45mm/px · 3 of 143 slices shown]
[im 48/143  soft-tissue]
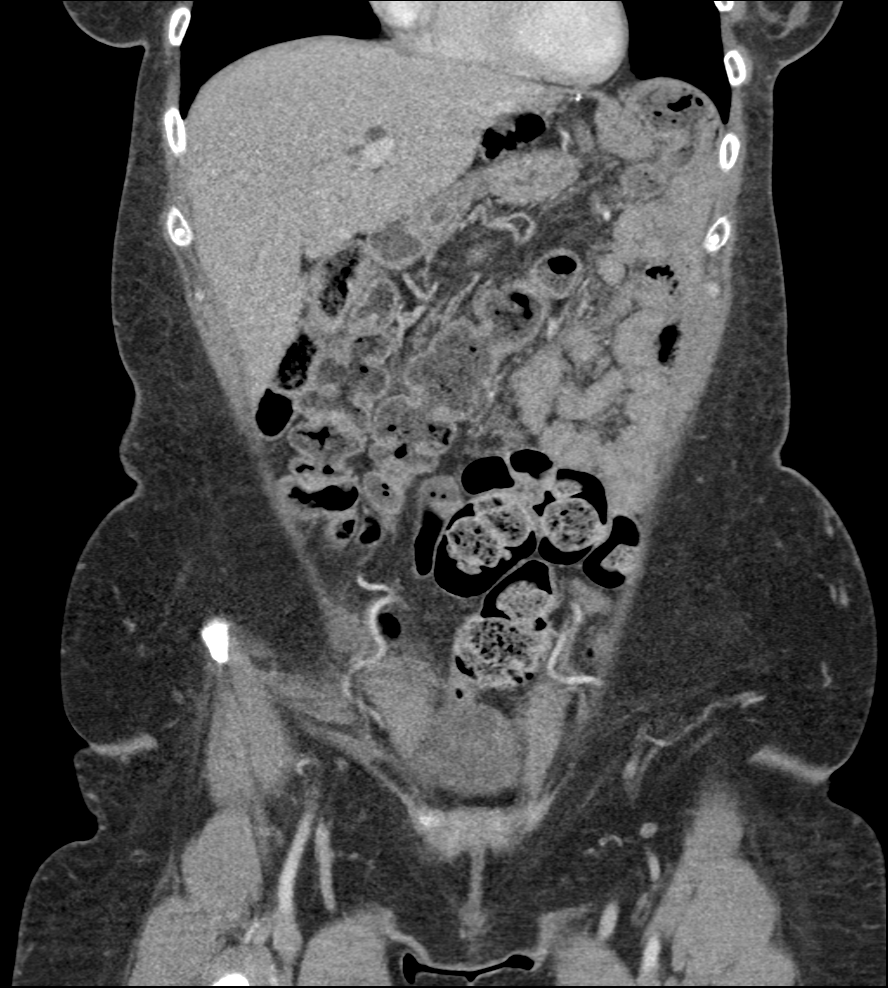
[im 64/143  soft-tissue]
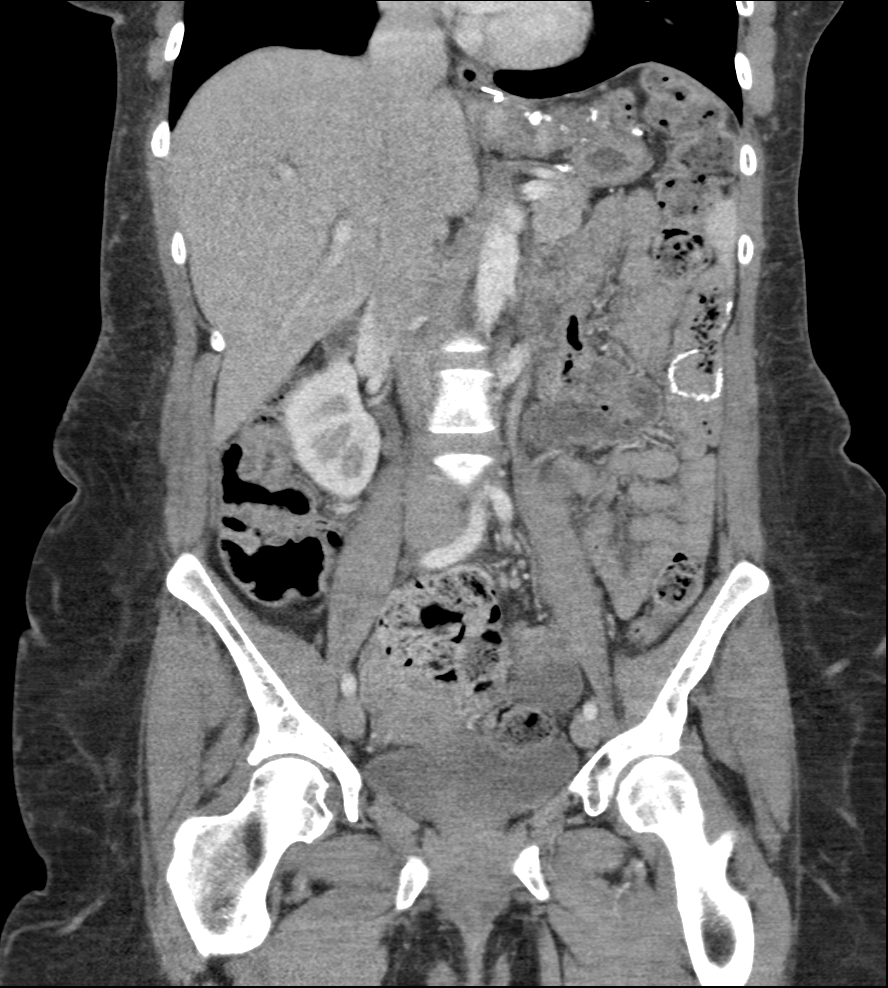
[im 79/143  soft-tissue]
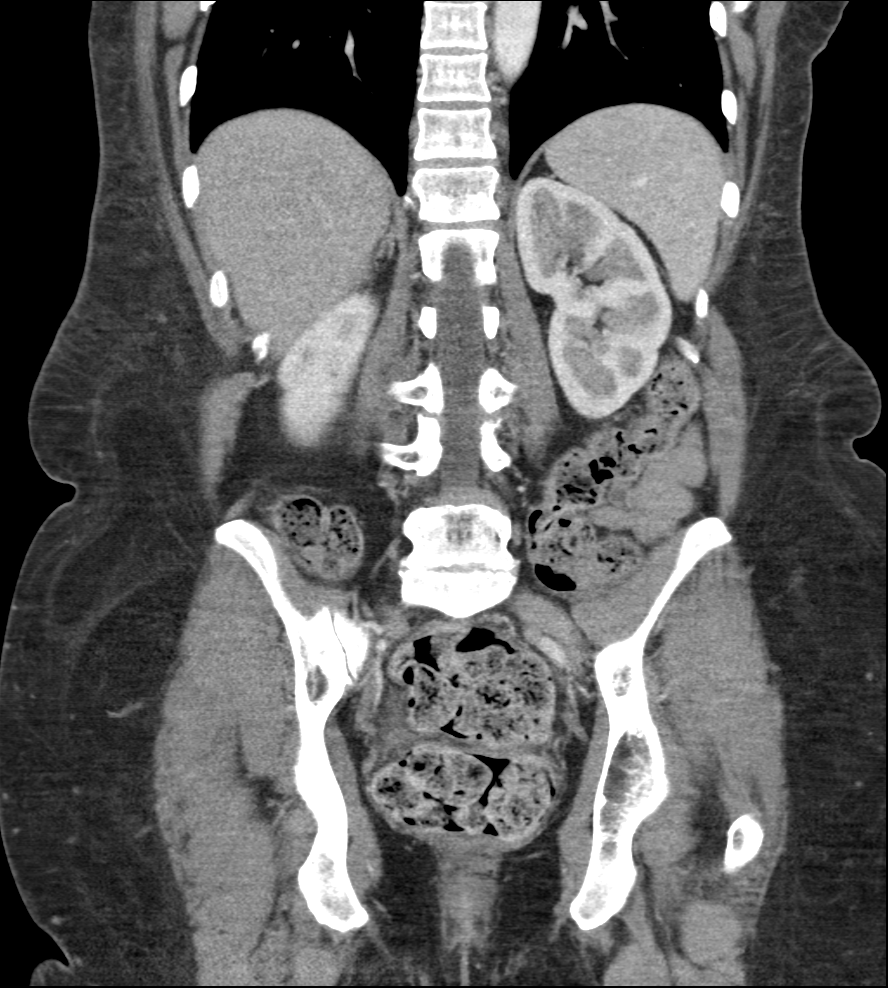

[11 of 46 positions shown; findings below may reference images not displayed]

FINDINGS: The visualized lung bases are clear.

The liver and spleen are unremarkable in appearance. The patient is
status post cholecystectomy, with clips noted at the gallbladder
fossa. The common bile duct is dilated to 1.8 cm, with mild
prominence of the intrahepatic biliary ducts, likely still within
normal limits status post cholecystectomy. The pancreas and adrenal
glands are unremarkable.

The kidneys are unremarkable in appearance. There is no evidence of
hydronephrosis. No renal or ureteral stones are seen. No perinephric
stranding is appreciated.

No free fluid is identified. Postoperative change is noted about the
gastroesophageal junction, with multiple bowel suture lines along
the small bowel and about the gastrojejunal anastomosis. The distal
stomach is largely decompressed, containing trace fluid. No acute
vascular abnormalities are seen.

The appendix is normal in caliber, without evidence for
appendicitis. The transverse colon is mildly redundant. The colon is
largely filled with stool, with distention of the rectum to 8.9 cm
with dense stool, concerning for mild constipation and some degree
of fecal impaction.

The bladder is mildly distended and grossly unremarkable. The uterus
is unremarkable in appearance. The ovaries are relatively symmetric,
with a 2.3 cm left adnexal follicle likely physiologic in nature. No
inguinal lymphadenopathy is seen.

No acute osseous abnormalities are identified. Mild vacuum
phenomenon is noted at L5-S1, which can be within normal limits.
IMPRESSION: 1. Colon largely filled with stool, with distention of the rectum to
8.9 cm with dense stool, compatible with mild constipation and some
degree of fecal impaction. Would correlate with the patient's
symptoms.
2. Dilatation of the common bile duct and mild prominence of the
intrahepatic biliary ducts is likely within normal limits status
post cholecystectomy.

## 2017-08-20 ENCOUNTER — Encounter: Payer: Self-pay | Admitting: Emergency Medicine

## 2017-08-20 ENCOUNTER — Emergency Department
Admission: EM | Admit: 2017-08-20 | Discharge: 2017-08-20 | Disposition: A | Discharge status: Home / Self Care | Payer: Medicaid Other | Source: Home / Self Care | Attending: Family Medicine | Admitting: Family Medicine

## 2017-08-20 ENCOUNTER — Other Ambulatory Visit: Payer: Self-pay

## 2017-08-20 DIAGNOSIS — K0500 Acute gingivitis, plaque induced: Secondary | ICD-10-CM | POA: Diagnosis not present

## 2017-08-20 MED ORDER — CLINDAMYCIN HCL 300 MG PO CAPS: ORAL_CAPSULE | ORAL | 0 refills | Status: DC

## 2017-08-20 MED ORDER — HYDROCODONE-ACETAMINOPHEN 5-325 MG PO TABS: 1.0000 | ORAL_TABLET | Freq: Four times a day (QID) | ORAL | 0 refills | Status: DC | PRN

## 2017-08-20 NOTE — Discharge Instructions (Signed)
When tolerated, try warm salt water mouth rinse.

## 2017-08-20 NOTE — ED Triage Notes (Signed)
Reports noticing tenderness in upper right gum line yesterday; increasingly painful and today cannot eat or drink. Took 600mg  ibuprofen at 1200.

## 2017-08-20 NOTE — ED Provider Notes (Signed)
[name]    CSN: [id number] Arrival date & time: 08/20/17  1319     History   Chief Complaint Chief Complaint  Patient presents with  . Mouth Lesions    HPI Peggy Austin is a 47 y.o. female.   Patient reports that she developed sweats two nights ago.  Yesterday she developed pain in her right upper gingiva (she is edentulous, and wears dentures), and has pain when wearing her upper dentures.  She has pain when eating and notes mild swelling in her right cheek.  No difficulty swallowing.  She had low grade fever 99.9 this morning.   The history is provided by the patient.    Past Medical History:  Diagnosis Date  . Asthma   . Bipolar II disorder with rapid cycling ([place])   . DDD (degenerative disc disease), cervical   . Gastroparesis   . GERD (gastroesophageal reflux disease)   . Hernia   . IBS (irritable bowel syndrome)   . Lumbar herniated disc   . Pancreatitis   . Partial bowel obstruction ([place])   . PIH (pregnancy induced hypertension)   . Recurrent oral ulcers   . Tension headache   . Thyroid disease   . Ulcer     Patient Active Problem List   Diagnosis Date Noted  . Medication management 04/13/2015  . Adjustment disorder with depressed mood 04/13/2015  . Severe episode of recurrent major depressive disorder, without psychotic features ([place])   . Severe major depression without psychotic features ([place]) 04/06/2015  . Major depressive disorder, recurrent episode, severe ([place]) 04/06/2015  . Medication side effect 03/26/2015  . PREGNANCY-INDUCED HYPERTENSION 06/28/2010  . GESTATIONAL DIABETES 06/28/2010  . DYSPNEA ON EXERTION 06/28/2010  . OBESITY 09/14/2009  . CONSTIPATION 09/14/2009  . NAUSEA AND VOMITING 09/14/2009  . NAUSEA ALONE 09/14/2009  . FLATULENCE 09/14/2009  . ABDOMINAL PAIN, GENERALIZED 09/14/2009  . INTESTINAL MALABSORPTION, POSTSURGICAL 06/10/2008  . SKIN LESIONS, MULTIPLE 06/10/2008  . PRIOR LAPAROSCOPIC [place]  [facility] 03/17/2008  . ANEMIA-UNSPECIFIED 02/13/2008  . IRRITABLE BOWEL SYNDROME 02/13/2008  . ABDOMINAL PAIN-MULTIPLE SITES 02/13/2008    Past Surgical History:  Procedure Laterality Date  . BACK SURGERY    . CESAREAN SECTION    . CHOLECYSTECTOMY    . ENDOMETRIAL ABLATION    . GASTRIC BYPASS    . HERNIA REPAIR    . TUBAL LIGATION      OB History    No data available       Home Medications    Prior to Admission medications   Medication Sig Start Date End Date Taking? Authorizing Provider  esomeprazole (NEXIUM) 40 MG capsule Take 40 mg by mouth daily at 12 noon.   Yes [provider]  lubiprostone (AMITIZA) 8 MCG capsule Take 8 mcg by mouth 2 (two) times daily with a meal.   Yes [provider]  promethazine (PHENERGAN) 25 MG tablet Take 25 mg by mouth every 6 (six) hours as needed for nausea or vomiting.   Yes [provider]  ranitidine (ZANTAC) 150 MG tablet Take 300 mg by mouth 2 (two) times daily.   Yes [provider]  risperiDONE (RISPERDAL) 1 MG tablet Take 1 mg by mouth at bedtime.   Yes [provider]  traZODone (DESYREL) 150 MG tablet Take 225 mg by mouth at bedtime.   Yes [provider]  acetaminophen (TYLENOL) 500 MG tablet Take 1 tablet (500 mg total) by mouth every 6 (six) hours as needed. Patient  taking differently: Take 500 mg by mouth every 6 (six) hours as needed for mild pain.  06/11/15   Waynetta Pean, PA-C  albuterol (PROVENTIL HFA;VENTOLIN HFA) 108 (90 Base) MCG/ACT inhaler Inhale 2 puffs into the lungs every 4 (four) hours as needed for wheezing or shortness of breath. 06/11/15   Waynetta Pean, PA-C  albuterol (PROVENTIL HFA;VENTOLIN HFA) 108 (90 Base) MCG/ACT inhaler Inhale 2 puffs into the lungs every 4 (four) hours as needed for wheezing or shortness of breath. 07/17/16   Jacqulyn Cane, MD  cetirizine (ZYRTEC ALLERGY) 10 MG tablet Take 1 tablet (10 mg total) by mouth daily. 06/11/15   Waynetta Pean,  PA-C  clindamycin (CLEOCIN) 300 MG capsule Take one cap by mouth every 8 hours 08/20/17   Kandra Nicolas, MD  clonazePAM (KLONOPIN) 0.5 MG tablet Take 1 tablet by mouth (0.5mg ) in morning, Take 2 tablets by mouth (1 mg) at bedtime Patient not taking: Reported on 08/30/2016 04/12/15   Derrill Center, NP  dicyclomine (BENTYL) 20 MG tablet Take 1 tablet (20 mg total) by mouth 2 (two) times daily. Patient not taking: Reported on 08/30/2016 06/26/16   Ashley Murrain, NP  docusate sodium (COLACE) 100 MG capsule Take 1 capsule (100 mg total) by mouth every 12 (twelve) hours. 08/31/16   Montine Circle, PA-C  DULoxetine (CYMBALTA) 60 MG capsule Take 1 capsule (60 mg total) by mouth daily. Patient not taking: Reported on 08/30/2016 04/12/15   Derrill Center, NP  gabapentin (NEURONTIN) 300 MG capsule Take 1 capsule (300 mg total) by mouth 3 (three) times daily. Take 300mg  in the morning, 300mg  at 3pm, and 600mg  at night 04/13/15   Patrecia Pour, NP  HYDROcodone-acetaminophen (NORCO/VICODIN) 5-325 MG tablet Take 1 tablet by mouth every 6 (six) hours as needed for moderate pain. Take one by mouth at bedtime as needed for pain 08/20/17   Kandra Nicolas, MD  levothyroxine (SYNTHROID, LEVOTHROID) 50 MCG tablet Take 25 mcg by mouth daily before breakfast.     [provider]  lidocaine (LIDODERM) 5 % Place 1 patch onto the skin daily. Remove & Discard patch within 12 hours or as directed by MD 06/26/16   Ashley Murrain, NP  Linaclotide [facility]) 145 MCG CAPS capsule Take 145 mcg by mouth daily as needed (severe constipation).     [provider]  ondansetron (ZOFRAN) 4 MG tablet Take 4 mg by mouth every 6 (six) hours as needed for nausea or vomiting.  03/06/15   [provider]  pantoprazole (PROTONIX) 20 MG tablet Take 1 tablet (20 mg total) by mouth daily. Patient not taking: Reported on 08/30/2016 04/12/15   Derrill Center, NP  polyethylene glycol powder (GLYCOLAX/MIRALAX) powder Take 17 g  by mouth 2 (two) times daily. Until daily soft stools  OTC 08/31/16   Montine Circle, PA-C  predniSONE (DELTASONE) 10 MG tablet Take as directed for 6 days.--Take 6 on day 1, 5 on day 2, 4 on day 3, then 3 tablets on day 4, then 2 tablets on day 5, then 1 on day 6. 07/17/16   Jacqulyn Cane, MD  QUEtiapine (SEROQUEL) 400 MG tablet Take 400 mg by mouth at bedtime.    [provider]  sodium phosphate (FLEET) 7-19 GM/118ML ENEM Place 133 mLs (1 enema total) rectally daily as needed for severe constipation. 08/31/16   Montine Circle, PA-C  sucralfate (CARAFATE) 1 GM/10ML suspension Take 10 mLs (1 g total) by mouth 4 (four) times daily -  with meals and at bedtime. 06/26/16   Ashley Murrain, NP  citalopram (CELEXA) 20 MG tablet Take 20 mg by mouth daily.    08/22/11  [provider]  metFORMIN (GLUCOPHAGE) 500 MG tablet Take 500 mg by mouth 2 (two) times daily with a meal.    08/22/11  [provider]    Family History Family History  Family history unknown: Yes    Social History Social History   Tobacco Use  . Smoking status: Never Smoker  . Smokeless tobacco: Never Used  Substance Use Topics  . Alcohol use: No    Comment:    . Drug use: No     Allergies   Amoxicillin; Avelox [moxifloxacin hcl in nacl]; Doxycycline; Morphine; Penicillins; Salmon [fish allergy]; and Moxifloxacin   Review of Systems Review of Systems  Constitutional: Positive for appetite change, chills, diaphoresis, fatigue and fever. Negative for activity change.  HENT: Positive for dental problem, facial swelling and mouth sores. Negative for congestion, ear pain, rhinorrhea, sinus pressure, sinus pain, sore throat and trouble swallowing.   Eyes: Negative.   Respiratory: Negative.   Cardiovascular: Negative.   Gastrointestinal: Negative.   Genitourinary: Negative.   Musculoskeletal: Negative.   Neurological: Negative.      Physical Exam Triage Vital Signs ED Triage Vitals  Enc  Vitals Group     BP 08/20/17 1426 125/86     Pulse Rate 08/20/17 1426 (!) 101     Resp 08/20/17 1426 18     Temp 08/20/17 1426 98.5 F (36.9 C)     Temp Source 08/20/17 1426 Oral     SpO2 08/20/17 1426 98 %     Weight 08/20/17 1427 160 lb (72.6 kg)     Height 08/20/17 1427 5\' 2"  (1.575 m)     Head Circumference --      Peak Flow --      Pain Score 08/20/17 1427 7     Pain Loc --      Pain Edu? --      Excl. in [place]? --    No data found.  Updated Vital Signs BP 125/86 (BP Location: Right Arm)   Pulse (!) 101   Temp 98.5 F (36.9 C) (Oral)   Resp 18   Ht 5\' 2"  (1.575 m)   Wt 160 lb (72.6 kg)   LMP 08/04/2017 (Exact Date)   SpO2 98%   BMI 29.26 kg/m   Visual Acuity Right Eye Distance:   Left Eye Distance:   Bilateral Distance:    Right Eye Near:   Left Eye Near:    Bilateral Near:     Physical Exam  Constitutional: She appears well-developed and well-nourished. No distress.  HENT:  Head: Normocephalic.    Right Ear: Tympanic membrane, external ear and ear canal normal.  Left Ear: Tympanic membrane, external ear and ear canal normal.  Nose: Nose normal.  Mouth/Throat: Uvula is midline and mucous membranes are normal. She has dentures. No oral lesions. No trismus in the jaw. No uvula swelling.    Right upper gingiva is tender to palpation but not swollen or erythematous.  Tenderness right face, with minimal swelling, as noted on diagram.   Eyes: Conjunctivae are normal. Pupils are equal, round, and reactive to light.  Neck: Neck supple.  Mildly tender shotty right sub-mandibular node  Cardiovascular: Normal heart sounds.  Pulmonary/Chest: Breath sounds normal.  Skin: Skin is warm and dry. No rash noted.  Nursing note and vitals reviewed.  UC Treatments / Results  Labs (all labs ordered are listed, but only abnormal results are displayed) Labs Reviewed - No data to display  EKG  EKG Interpretation None       Radiology No results  found.  Procedures Procedures (including critical care time)  Medications Ordered in UC Medications - No data to display   Initial Impression / Assessment and Plan / UC Course  I have reviewed the triage vital signs and the nursing notes.  Pertinent labs & imaging results that were available during my care of the patient were reviewed by me and considered in my medical decision making (see chart for details).    Begin clindamycin.  Rx for Lortab (#12, no refill). Controlled Substance Prescriptions I have consulted the [place] Controlled Substances Registry for this patient, and feel the risk/benefit ratio today is favorable for proceeding with this prescription for a controlled substance.   When tolerated, try warm salt water mouth rinse. Followup with dentist as soon as possible.     Final Clinical Impressions(s) / UC Diagnoses   Final diagnoses:  Acute gingivitis    ED Discharge Orders        Ordered    clindamycin (CLEOCIN) 300 MG capsule     08/20/17 1543    HYDROcodone-acetaminophen (NORCO/VICODIN) 5-325 MG tablet  Every 6 hours PRN     08/20/17 1543           Kandra Nicolas, MD 08/21/17 1310

## 2018-01-24 ENCOUNTER — Other Ambulatory Visit: Payer: Self-pay

## 2018-01-24 ENCOUNTER — Emergency Department (HOSPITAL_COMMUNITY)
Admission: EM | Admit: 2018-01-24 | Discharge: 2018-01-24 | Disposition: A | Discharge status: Home / Self Care | Payer: Medicaid Other | Attending: Emergency Medicine | Admitting: Emergency Medicine

## 2018-01-24 ENCOUNTER — Encounter (HOSPITAL_COMMUNITY): Payer: Self-pay

## 2018-01-24 DIAGNOSIS — E039 Hypothyroidism, unspecified: Secondary | ICD-10-CM | POA: Diagnosis not present

## 2018-01-24 DIAGNOSIS — Z79899 Other long term (current) drug therapy: Secondary | ICD-10-CM | POA: Insufficient documentation

## 2018-01-24 DIAGNOSIS — K05219 Aggressive periodontitis, localized, unspecified severity: Secondary | ICD-10-CM

## 2018-01-24 DIAGNOSIS — K047 Periapical abscess without sinus: Secondary | ICD-10-CM | POA: Insufficient documentation

## 2018-01-24 DIAGNOSIS — R4789 Other speech disturbances: Secondary | ICD-10-CM | POA: Diagnosis not present

## 2018-01-24 DIAGNOSIS — J45909 Unspecified asthma, uncomplicated: Secondary | ICD-10-CM | POA: Insufficient documentation

## 2018-01-24 DIAGNOSIS — K0889 Other specified disorders of teeth and supporting structures: Secondary | ICD-10-CM | POA: Diagnosis present

## 2018-01-24 MED ORDER — HYDROCODONE-ACETAMINOPHEN 5-325 MG PO TABS
1.0000 | ORAL_TABLET | Freq: Once | ORAL | Status: AC
  Administered 2018-01-24: 1 via ORAL
  Filled 2018-01-24: qty 1

## 2018-01-24 MED ORDER — TRAMADOL HCL 50 MG PO TABS: 50.0000 mg | ORAL_TABLET | Freq: Four times a day (QID) | ORAL | 0 refills | Status: AC | PRN

## 2018-01-24 MED ORDER — CLINDAMYCIN HCL 300 MG PO CAPS: 300.0000 mg | ORAL_CAPSULE | Freq: Three times a day (TID) | ORAL | 0 refills | Status: DC

## 2018-01-24 NOTE — ED Notes (Signed)
PT states understanding of care given, follow up care. PT ambulated from ED to car with a steady gait.  

## 2018-01-24 NOTE — Discharge Instructions (Signed)
You were seen here today mouth pain.  You are found to have a gingival abscess.  Please follow-up with your dentist tomorrow. Please take all of your antibiotics until finished!   You may develop abdominal discomfort or diarrhea from the antibiotic.  You may help offset this with probiotics which you can buy or get in yogurt. Do not eat or take the probiotics until 2 hours after your antibiotic. Do not take your medicine if develop an itchy rash, swelling in your mouth or lips, or difficulty breathing.   Return for facial swelling, fever, vomiting, difficulty breathing or swallowing.    For breakthrough pain you may take Ultram. Do not drink alcohol drive or operate heavy machinery when taking. You are being provided a prescription for opiates (also known as narcotics) for pain control on an as needed basis.  Opiates can be addictive and should only be used when absolutely necessary for pain control when other alternatives do not work.  We recommend you only use them for the recommended amount of time and only as prescribed.  Please do not take with other sedative medications or alcohol.  Please do not drive, operate machinery, or make important decisions while taking opiates.  Please note that these medications can be addictive and have high abuse potential.  Please keep these medications locked away from children, teenagers or any family members with history of substance abuse. Additionally, these medications may cause constipation - take over the counter stool softeners or add fiber to your diet to treat this (Metamucil, Psyllium Fiber, Colace, Miralax) Further refills will need to be obtained from your primary care doctor and will not be prescribed through the Emergency Department. You will test positive on most drug tests while taking this medication.

## 2018-01-24 NOTE — ED Provider Notes (Signed)
[facility] EMERGENCY DEPARTMENT Provider Note   CSN: [id number] Arrival date & time: 01/24/18  1236     History   Chief Complaint Chief Complaint  Patient presents with  . Dental Pain    HPI Peggy Austin is a 47 y.o. female with recurrent oral ulcers, edentulous presents emergency department today for mouth pain.  Patient reports that she started having significant pain in her left upper left yesterday.  She reports that is where the top of her dentures meet her gumline.  She notes pain began yesterday evening and persisted today.  She reports she has not been able to remove her dentures secondary to the pain.  She does report some drainage from above her dentures.  She notes some facial swelling adjacent to the upper lip.  She denies she has taken over-the-counter medication for symptoms without any relief.  She reports the pain is a severe that she has mild difficulty with speech but denies any aphasia, facial droop, numbness of the face, visual changes, focal weakness, vertigo. Denies fever, chills, inability to control secretions, nausea/vomiting, dysphagia, odynophagia, drainage or trauma  HPI  Past Medical History:  Diagnosis Date  . Asthma   . Bipolar II disorder with rapid cycling ([place])   . DDD (degenerative disc disease), cervical   . Gastroparesis   . GERD (gastroesophageal reflux disease)   . Hernia   . IBS (irritable bowel syndrome)   . Lumbar herniated disc   . Pancreatitis   . Partial bowel obstruction ([place])   . PIH (pregnancy induced hypertension)   . Recurrent oral ulcers   . Tension headache   . Thyroid disease   . Ulcer     Patient Active Problem List   Diagnosis Date Noted  . Medication management 04/13/2015  . Adjustment disorder with depressed mood 04/13/2015  . Severe episode of recurrent major depressive disorder, without psychotic features ([place])   . Severe major depression without psychotic features ([place]) 04/06/2015  . Major  depressive disorder, recurrent episode, severe ([place]) 04/06/2015  . Medication side effect 03/26/2015  . PREGNANCY-INDUCED HYPERTENSION 06/28/2010  . GESTATIONAL DIABETES 06/28/2010  . DYSPNEA ON EXERTION 06/28/2010  . OBESITY 09/14/2009  . CONSTIPATION 09/14/2009  . NAUSEA AND VOMITING 09/14/2009  . NAUSEA ALONE 09/14/2009  . FLATULENCE 09/14/2009  . ABDOMINAL PAIN, GENERALIZED 09/14/2009  . INTESTINAL MALABSORPTION, POSTSURGICAL 06/10/2008  . SKIN LESIONS, MULTIPLE 06/10/2008  . PRIOR LAPAROSCOPIC [place] SURGERY 03/17/2008  . ANEMIA-UNSPECIFIED 02/13/2008  . IRRITABLE BOWEL SYNDROME 02/13/2008  . ABDOMINAL PAIN-MULTIPLE SITES 02/13/2008    Past Surgical History:  Procedure Laterality Date  . BACK SURGERY    . CESAREAN SECTION    . CHOLECYSTECTOMY    . ENDOMETRIAL ABLATION    . GASTRIC BYPASS    . HERNIA REPAIR    . TUBAL LIGATION       OB History   None      Home Medications    Prior to Admission medications   Medication Sig Start Date End Date Taking? Authorizing Provider  acetaminophen (TYLENOL) 500 MG tablet Take 1 tablet (500 mg total) by mouth every 6 (six) hours as needed. Patient taking differently: Take 500 mg by mouth every 6 (six) hours as needed for mild pain.  06/11/15  Yes Waynetta Pean, PA-C  albuterol (PROVENTIL HFA;VENTOLIN HFA) 108 (90 Base) MCG/ACT inhaler Inhale 2 puffs into the lungs every 4 (four) hours as needed for wheezing or shortness of breath. 07/17/16  Yes Jacqulyn Cane, MD  carbamazepine (TEGRETOL) 100 MG chewable tablet Chew 200-300 mg by mouth See admin instructions. 200mg  in the morning and 300mg  at bedtime 01/23/18  Yes [provider]  cetirizine (ZYRTEC ALLERGY) 10 MG tablet Take 1 tablet (10 mg total) by mouth daily. Patient taking differently: Take 10 mg by mouth daily as needed for allergies.  06/11/15  Yes Waynetta Pean, PA-C  esomeprazole (NEXIUM) 40 MG capsule Take 40 mg by mouth daily at 12 noon.   Yes  [provider]  levothyroxine (SYNTHROID, LEVOTHROID) 25 MCG tablet Take 25 mcg by mouth daily before breakfast.    Yes [provider]  promethazine (PHENERGAN) 25 MG tablet Take 25 mg by mouth every 6 (six) hours as needed for nausea or vomiting.   Yes [provider]  ranitidine (ZANTAC) 150 MG tablet Take 300 mg by mouth 2 (two) times daily.   Yes [provider]  risperiDONE (RISPERDAL) 1 MG tablet Take 1 mg by mouth at bedtime.   Yes [provider]  traZODone (DESYREL) 150 MG tablet Take 150 mg by mouth at bedtime as needed for sleep.    Yes [provider]  DULoxetine (CYMBALTA) 60 MG capsule Take 1 capsule (60 mg total) by mouth daily. Patient not taking: Reported on 08/30/2016 04/12/15   Derrill Center, NP  gabapentin (NEURONTIN) 300 MG capsule Take 1 capsule (300 mg total) by mouth 3 (three) times daily. Take 300mg  in the morning, 300mg  at 3pm, and 600mg  at night Patient not taking: Reported on 01/24/2018 04/13/15   Patrecia Pour, NP  pantoprazole (PROTONIX) 20 MG tablet Take 1 tablet (20 mg total) by mouth daily. Patient not taking: Reported on 08/30/2016 04/12/15   Derrill Center, NP  sucralfate (CARAFATE) 1 GM/10ML suspension Take 10 mLs (1 g total) by mouth 4 (four) times daily -  with meals and at bedtime. Patient not taking: Reported on 01/24/2018 06/26/16   Ashley Murrain, NP  citalopram (CELEXA) 20 MG tablet Take 20 mg by mouth daily.    08/22/11  [provider]  metFORMIN (GLUCOPHAGE) 500 MG tablet Take 500 mg by mouth 2 (two) times daily with a meal.    08/22/11  [provider]    Family History Family History  Family history unknown: Yes    Social History Social History   Tobacco Use  . Smoking status: Never Smoker  . Smokeless tobacco: Never Used  Substance Use Topics  . Alcohol use: No    Comment:    . Drug use: No     Allergies   Amoxicillin; Doxycycline; Morphine; Penicillins; Hyman Hopes allergy]; Avelox [moxifloxacin hcl in nacl]; and Moxifloxacin   Review of Systems Review of Systems  All other systems reviewed and are negative.    Physical Exam Updated Vital Signs BP 106/86   Pulse 67   Temp 98.6 F (37 C)   Resp 12   LMP 01/06/2018 (Exact Date)   SpO2 96%   Physical Exam  Constitutional: She appears well-developed and well-nourished.  Patient appears uncomfortable but in NAD.   HENT:  Head: Normocephalic and atraumatic.  Right Ear: External ear normal.  Left Ear: External ear normal.  Mouth/Throat: She has dentures. Dental abscesses present.  The patient dentures were visualized. There was noted to be purulent discharge when dentures were palpated of the left upper. Dentures were removed and draining abscess with laceration where gumline and upper cheek meet noted as seen in the picture below. There is  mild swelling of the adjacent lip. This appears to be the area of pain. The patient has normal phonation and is in control of secretions. No stridor.  Midline uvula without edema. Soft palate rises symmetrically.  No tonsillar erythema or exudates. No PTA. Tongue protrusion is normal. No trismus. No creptius on neck palpation. Mucus membranes moist.   Eyes: Conjunctivae are normal. Right eye exhibits no discharge. Left eye exhibits no discharge. No scleral icterus.  Neck:  No nuchal rigidity or meningismus  Pulmonary/Chest: Effort normal. No respiratory distress.  Neurological: She is alert.  Speech clear. Follows commands. No facial droop. PERRLA. EOMI. Normal peripheral fields. CN III-XII intact.  Grossly moves all extremities 4 without ataxia. Coordination intact. Able and appropriate strength for age to upper and lower extremities bilaterally including grip strength & plantar flexion/dorsiflexion. Sensation to light touch intact bilaterally for upper and lower. Patellar deep tendon reflex 2+ and equal bilaterally. Normal finger to nose. No pronator  drift. Normal gait.   Skin: No pallor.  Psychiatric: She has a normal mood and affect.  Nursing note and vitals reviewed.      ED Treatments / Results  Labs (all labs ordered are listed, but only abnormal results are displayed) Labs Reviewed - No data to display  EKG None  Radiology No results found.  Procedures Procedures (including critical care time)  Medications Ordered in ED Medications  HYDROcodone-acetaminophen (NORCO/VICODIN) 5-325 MG per tablet 1 tablet (1 tablet Oral Given 01/24/18 1902)     Initial Impression / Assessment and Plan / ED Course  I have reviewed the triage vital signs and the nursing notes.  Pertinent labs & imaging results that were available during my care of the patient were reviewed by me and considered in my medical decision making (see chart for details).     47 y.o. female with gingival abscess underlying dentures. Area was draining when patient arrived in department. Please see picture above after drainage complete. Exam unconcerning for Ludwig's angina or spread of infection. Normal neurologic exam. Will treat with Clindamycin and pain medication (cannot take NSAIDS).  Patient reviewed in [place] Controlled Substance Reporting System.  Patient to follow up with her dentist, Dr. Michell Heinrich. She was given dental resources. Return precuations discussed. Patient appears safe for discharge.   Final Clinical Impressions(s) / ED Diagnoses   Final diagnoses:  Gingival abscess    ED Discharge Orders         Ordered    clindamycin (CLEOCIN) 300 MG capsule  3 times daily     01/24/18 1930    traMADol (ULTRAM) 50 MG tablet  Every 6 hours PRN     01/24/18 1930           Lorelle Gibbs 01/26/18 7510    Sherwood Gambler, MD 01/27/18 [phone]

## 2018-01-24 NOTE — ED Triage Notes (Signed)
Pt presents for evaluation of L sided facial swelling. States started with tingling to L face yesterday and woke up this morning around 0500 with significant pain. States PCP sent her here. Has hx of multiple dental issues and procedures but states this feels different. Also feels she is having difficulty speaking due to pain.

## 2018-03-23 ENCOUNTER — Emergency Department
Admission: EM | Admit: 2018-03-23 | Discharge: 2018-03-23 | Disposition: A | Discharge status: Home / Self Care | Payer: Medicaid Other | Source: Home / Self Care | Attending: Family Medicine | Admitting: Family Medicine

## 2018-03-23 ENCOUNTER — Other Ambulatory Visit: Payer: Self-pay

## 2018-03-23 DIAGNOSIS — K047 Periapical abscess without sinus: Secondary | ICD-10-CM | POA: Diagnosis not present

## 2018-03-23 MED ORDER — CLINDAMYCIN HCL 300 MG PO CAPS: 300.0000 mg | ORAL_CAPSULE | Freq: Four times a day (QID) | ORAL | 0 refills | Status: AC

## 2018-03-23 MED ORDER — MAGIC MOUTHWASH W/LIDOCAINE: 5.0000 mL | Freq: Three times a day (TID) | ORAL | 0 refills | Status: AC | PRN

## 2018-03-23 NOTE — ED Provider Notes (Signed)
[facility]    CSN: [id number] Arrival date & time: 03/23/18  1344     History   Chief Complaint Chief Complaint  Patient presents with  . Mouth Lesions    due to dentures    HPI Peggy Austin is a 47 y.o. female.   HPI Peggy Austin is a 47 y.o. female presenting to UC with c/o Left sided upper gum and facial pain with swelling, redness and drainage of pus for about 1 week. She was seeing improvement with saltwater gargles but last night pain worsened. Throbbing. Pt believes the infection started from a cut in her mouth from her dentures.  She took 2 extra strength tylenol at noon.  Pain is 8/10. Denies difficulty breathing or swallowing.    Past Medical History:  Diagnosis Date  . Asthma   . Bipolar II disorder with rapid cycling ([place])   . DDD (degenerative disc disease), cervical   . Gastroparesis   . GERD (gastroesophageal reflux disease)   . Hernia   . IBS (irritable bowel syndrome)   . Lumbar herniated disc   . Pancreatitis   . Partial bowel obstruction ([place])   . PIH (pregnancy induced hypertension)   . Recurrent oral ulcers   . Tension headache   . Thyroid disease   . Ulcer     Patient Active Problem List   Diagnosis Date Noted  . Medication management 04/13/2015  . Adjustment disorder with depressed mood 04/13/2015  . Severe episode of recurrent major depressive disorder, without psychotic features ([place])   . Severe major depression without psychotic features ([place]) 04/06/2015  . Major depressive disorder, recurrent episode, severe ([place]) 04/06/2015  . Medication side effect 03/26/2015  . PREGNANCY-INDUCED HYPERTENSION 06/28/2010  . GESTATIONAL DIABETES 06/28/2010  . DYSPNEA ON EXERTION 06/28/2010  . OBESITY 09/14/2009  . CONSTIPATION 09/14/2009  . NAUSEA AND VOMITING 09/14/2009  . NAUSEA ALONE 09/14/2009  . FLATULENCE 09/14/2009  . ABDOMINAL PAIN, GENERALIZED 09/14/2009  . INTESTINAL MALABSORPTION, POSTSURGICAL 06/10/2008  . SKIN  LESIONS, MULTIPLE 06/10/2008  . PRIOR LAPAROSCOPIC [place] SURGERY 03/17/2008  . ANEMIA-UNSPECIFIED 02/13/2008  . IRRITABLE BOWEL SYNDROME 02/13/2008  . ABDOMINAL PAIN-MULTIPLE SITES 02/13/2008    Past Surgical History:  Procedure Laterality Date  . BACK SURGERY    . CESAREAN SECTION    . CHOLECYSTECTOMY    . ENDOMETRIAL ABLATION    . GASTRIC BYPASS    . HERNIA REPAIR    . TUBAL LIGATION      OB History   None      Home Medications    Prior to Admission medications   Medication Sig Start Date End Date Taking? Authorizing Provider  acetaminophen (TYLENOL) 500 MG tablet Take 1 tablet (500 mg total) by mouth every 6 (six) hours as needed. Patient taking differently: Take 500 mg by mouth every 6 (six) hours as needed for mild pain.  06/11/15   Waynetta Pean, PA-C  albuterol (PROVENTIL HFA;VENTOLIN HFA) 108 (90 Base) MCG/ACT inhaler Inhale 2 puffs into the lungs every 4 (four) hours as needed for wheezing or shortness of breath. 07/17/16   Jacqulyn Cane, MD  carbamazepine (TEGRETOL) 100 MG chewable tablet Chew 200-300 mg by mouth See admin instructions. 200mg  in the morning and 300mg  at bedtime 01/23/18   [provider]  cetirizine (ZYRTEC ALLERGY) 10 MG tablet Take 1 tablet (10 mg total) by mouth daily. Patient taking differently: Take 10 mg by mouth daily as needed for allergies.  06/11/15   Waynetta Pean,  PA-C  clindamycin (CLEOCIN) 300 MG capsule Take 1 capsule (300 mg total) by mouth 4 (four) times daily. X 7 days 03/23/18   Noe Gens, PA-C  DULoxetine (CYMBALTA) 60 MG capsule Take 1 capsule (60 mg total) by mouth daily. Patient not taking: Reported on 08/30/2016 04/12/15   Derrill Center, NP  esomeprazole (NEXIUM) 40 MG capsule Take 40 mg by mouth daily at 12 noon.    [provider]  gabapentin (NEURONTIN) 300 MG capsule Take 1 capsule (300 mg total) by mouth 3 (three) times daily. Take 300mg  in the morning, 300mg  at 3pm, and 600mg  at  night Patient not taking: Reported on 01/24/2018 04/13/15   Patrecia Pour, NP  levothyroxine (SYNTHROID, LEVOTHROID) 25 MCG tablet Take 25 mcg by mouth daily before breakfast.     [provider]  magic mouthwash w/lidocaine SOLN Take 5 mLs by mouth 3 (three) times daily as needed for mouth pain. Swish and gargle for 30-60 seconds, then spit 03/23/18   Noe Gens, PA-C  pantoprazole (PROTONIX) 20 MG tablet Take 1 tablet (20 mg total) by mouth daily. Patient not taking: Reported on 08/30/2016 04/12/15   Derrill Center, NP  promethazine (PHENERGAN) 25 MG tablet Take 25 mg by mouth every 6 (six) hours as needed for nausea or vomiting.    [provider]  ranitidine (ZANTAC) 150 MG tablet Take 300 mg by mouth 2 (two) times daily.    [provider]  risperiDONE (RISPERDAL) 1 MG tablet Take 1 mg by mouth at bedtime.    [provider]  sucralfate (CARAFATE) 1 GM/10ML suspension Take 10 mLs (1 g total) by mouth 4 (four) times daily -  with meals and at bedtime. Patient not taking: Reported on 01/24/2018 06/26/16   Ashley Murrain, NP  traMADol (ULTRAM) 50 MG tablet Take 1 tablet (50 mg total) by mouth every 6 (six) hours as needed. 01/24/18   Maczis, Barth Kirks, PA-C  traZODone (DESYREL) 150 MG tablet Take 150 mg by mouth at bedtime as needed for sleep.     [provider]  citalopram (CELEXA) 20 MG tablet Take 20 mg by mouth daily.    08/22/11  [provider]  metFORMIN (GLUCOPHAGE) 500 MG tablet Take 500 mg by mouth 2 (two) times daily with a meal.    08/22/11  [provider]    Family History Family History  Family history unknown: Yes    Social History Social History   Tobacco Use  . Smoking status: Never Smoker  . Smokeless tobacco: Never Used  Substance Use Topics  . Alcohol use: No    Comment:    . Drug use: No     Allergies   Amoxicillin; Doxycycline; Morphine; Penicillins; Hyman Hopes allergy]; Avelox [moxifloxacin  hcl in nacl]; and Moxifloxacin   Review of Systems Review of Systems  Constitutional: Negative for chills and fever.  HENT: Positive for dental problem and facial swelling. Negative for sore throat.   Neurological: Negative for dizziness, light-headedness and headaches.     Physical Exam Triage Vital Signs ED Triage Vitals  Enc Vitals Group     BP 03/23/18 1427 (!) 124/91     Pulse Rate 03/23/18 1427 69     Resp --      Temp 03/23/18 1427 98.3 F (36.8 C)     Temp Source 03/23/18 1427 Oral     SpO2 03/23/18 1427 98 %     Weight 03/23/18 1428 151  lb (68.5 kg)     Height 03/23/18 1428 5\' 2"  (1.575 m)     Head Circumference --      Peak Flow --      Pain Score 03/23/18 1428 8     Pain Loc --      Pain Edu? --      Excl. in [place]? --    No data found.  Updated Vital Signs BP (!) 124/91 (BP Location: Right Arm)   Pulse 69   Temp 98.3 F (36.8 C) (Oral)   Ht 5\' 2"  (1.575 m)   Wt 151 lb (68.5 kg)   SpO2 98%   BMI 27.62 kg/m   Visual Acuity Right Eye Distance:   Left Eye Distance:   Bilateral Distance:    Right Eye Near:   Left Eye Near:    Bilateral Near:     Physical Exam  Constitutional: She is oriented to person, place, and time. She appears well-developed and well-nourished. No distress.  HENT:  Head: Normocephalic and atraumatic.  Nose: Right sinus exhibits no maxillary sinus tenderness and no frontal sinus tenderness. Left sinus exhibits maxillary sinus tenderness. Left sinus exhibits no frontal sinus tenderness.  Mouth/Throat: Uvula is midline, oropharynx is clear and moist and mucous membranes are normal. Abnormal dentition. Dental abscesses and dental caries present.    Left upper jawline- multiple missing teeth. Gingival erythema, edema and tenderness. Scant purulent discharge   Eyes: EOM are normal.  Neck: Normal range of motion.  Cardiovascular: Normal rate.  Pulmonary/Chest: Effort normal.  Musculoskeletal: Normal range of motion.  Neurological:  She is alert and oriented to person, place, and time.  Skin: Skin is warm and dry. She is not diaphoretic.  Psychiatric: She has a normal mood and affect. Her behavior is normal.  Nursing note and vitals reviewed.    UC Treatments / Results  Labs (all labs ordered are listed, but only abnormal results are displayed) Labs Reviewed - No data to display  EKG None  Radiology No results found.  Procedures Procedures (including critical care time)  Medications Ordered in UC Medications - No data to display  Initial Impression / Assessment and Plan / UC Course  I have reviewed the triage vital signs and the nursing notes.  Pertinent labs & imaging results that were available during my care of the patient were reviewed by me and considered in my medical decision making (see chart for details).     Hx and exam c/w bacterial infection Will tx with clindamycin   Final Clinical Impressions(s) / UC Diagnoses   Final diagnoses:  Dental abscess     Discharge Instructions      Please take antibiotics as prescribed and be sure to complete entire course even if you start to feel better to ensure infection does not come back.  Please call your dentist next week for further evaluation and treatment.     ED Prescriptions    Medication Sig Dispense Auth. Provider   clindamycin (CLEOCIN) 300 MG capsule Take 1 capsule (300 mg total) by mouth 4 (four) times daily. X 7 days 28 capsule Gerarda Fraction, Aseneth Hack O, PA-C   magic mouthwash w/lidocaine SOLN Take 5 mLs by mouth 3 (three) times daily as needed for mouth pain. Swish and gargle for 30-60 seconds, then spit 60 mL Leeroy Cha O, PA-C     Controlled Substance Prescriptions [place] Controlled Substance Registry consulted? pt has had multiple controlled substances prescribed by various providers within the last 1 year. will  hold off on narcotic pain medication at this time   Tyrell Antonio 03/23/18 1511

## 2018-03-23 NOTE — ED Triage Notes (Signed)
Pt c/o mouth sore due to new dentures. Noticed some pus coming from a sore on her left side. Pain level 8/10. Took 2 extra strength tylenol at noon.

## 2018-03-23 NOTE — Discharge Instructions (Signed)
°  Please take antibiotics as prescribed and be sure to complete entire course even if you start to feel better to ensure infection does not come back.  Please call your dentist next week for further evaluation and treatment.

## 2021-09-29 ENCOUNTER — Encounter (HOSPITAL_COMMUNITY): Payer: Self-pay | Admitting: *Deleted

## 2021-09-29 NOTE — Progress Notes (Signed)
Received fax from [facility] regarding transfer to our pulmonary rehab program at [facility].  Pt unable to continue driving to the [place] area for rehab.  Pt completed 5 visits from 2/2-4/21 attendance hampered due to financial cost of driving.  Will send Dr Michela Pitcher pulmonary rehab referral form to complete sign and return. Pt seen on 4/5 at the pulmonary clinic also reviewed various provider notes in [place].  Ok to proceed with scheduling with the understanding of our attendance policy.  Will forward to support staff to contact for scheduling once we receive the signed MD referral form. Cherre Huger, BSN ?Cardiac and Pulmonary Rehab Nurse Navigator  ? ?

## 2021-12-17 ENCOUNTER — Encounter (HOSPITAL_COMMUNITY): Payer: Self-pay

## 2021-12-29 ENCOUNTER — Encounter (HOSPITAL_COMMUNITY): Payer: Self-pay | Admitting: Emergency Medicine

## 2021-12-29 ENCOUNTER — Other Ambulatory Visit: Payer: Self-pay

## 2021-12-29 ENCOUNTER — Emergency Department (HOSPITAL_COMMUNITY): Payer: Medicaid Other

## 2021-12-29 ENCOUNTER — Emergency Department (HOSPITAL_COMMUNITY)
Admission: EM | Admit: 2021-12-29 | Discharge: 2021-12-29 | Disposition: A | Discharge status: Home / Self Care | Payer: Medicaid Other | Attending: Emergency Medicine | Admitting: Emergency Medicine

## 2021-12-29 DIAGNOSIS — Z20822 Contact with and (suspected) exposure to covid-19: Secondary | ICD-10-CM | POA: Diagnosis not present

## 2021-12-29 DIAGNOSIS — R0602 Shortness of breath: Secondary | ICD-10-CM | POA: Insufficient documentation

## 2021-12-29 LAB — BASIC METABOLIC PANEL
Anion gap: 5 (ref 5–15)
BUN: 20 mg/dL (ref 6–20)
CO2: 22 mmol/L (ref 22–32)
Calcium: 9.4 mg/dL (ref 8.9–10.3)
Chloride: 116 mmol/L — ABNORMAL HIGH (ref 98–111)
Creatinine, Ser: 0.97 mg/dL (ref 0.44–1.00)
GFR, Estimated: >60 mL/min (ref >60)
Glucose, Bld: 110 mg/dL — ABNORMAL HIGH (ref 70–99)
Potassium: 4.1 mmol/L (ref 3.5–5.1)
Sodium: 143 mmol/L (ref 135–145)

## 2021-12-29 LAB — CBC WITH DIFFERENTIAL/PLATELET
Abs Immature Granulocytes: 0.05 10*3/uL (ref 0.00–0.07)
Basophils Absolute: 0 10*3/uL (ref 0.0–0.1)
Basophils Relative: 0 %
Eosinophils Absolute: 0.2 10*3/uL (ref 0.0–0.5)
Eosinophils Relative: 3 %
HCT: 37.9 % (ref 36.0–46.0)
Hemoglobin: 11.6 g/dL — ABNORMAL LOW (ref 12.0–15.0)
Immature Granulocytes: 1 %
Lymphocytes Relative: 34 %
Lymphs Abs: 2.3 10*3/uL (ref 0.7–4.0)
MCH: 25.2 pg — ABNORMAL LOW (ref 26.0–34.0)
MCHC: 30.6 g/dL (ref 30.0–36.0)
MCV: 82.4 fL (ref 80.0–100.0)
Monocytes Absolute: 0.4 10*3/uL (ref 0.1–1.0)
Monocytes Relative: 6 %
Neutro Abs: 3.8 10*3/uL (ref 1.7–7.7)
Neutrophils Relative %: 56 %
Platelets: 460 10*3/uL — ABNORMAL HIGH (ref 150–400)
RBC: 4.6 MIL/uL (ref 3.87–5.11)
RDW: 17.3 % — ABNORMAL HIGH (ref 11.5–15.5)
WBC: 6.8 10*3/uL (ref 4.0–10.5)
nRBC: 0 % (ref 0.0–0.2)

## 2021-12-29 LAB — RESP PANEL BY RT-PCR (FLU A&B, COVID) ARPGX2
Influenza A by PCR: NEGATIVE
Influenza B by PCR: NEGATIVE
SARS Coronavirus 2 by RT PCR: NEGATIVE

## 2021-12-29 LAB — TROPONIN I (HIGH SENSITIVITY): Troponin I (High Sensitivity): 2 ng/L (ref <18)

## 2021-12-29 LAB — BRAIN NATRIURETIC PEPTIDE: B Natriuretic Peptide: 13.2 pg/mL (ref 0.0–100.0)

## 2021-12-29 MED ORDER — IOHEXOL 350 MG/ML SOLN
75.0000 mL | Freq: Once | INTRAVENOUS | Status: AC | PRN
  Administered 2021-12-29: 75 mL via INTRAVENOUS

## 2021-12-29 NOTE — Discharge Instructions (Signed)
Your CT scan did not show blood clot in the lung.  It did showed some residual lung disease, this could be consistent with your pneumonia improving.  Please discuss your visit with your family doctor and asked them if it makes sense to take antibiotics for a longer course of time or restart steroids.  They may also want to have you seen by a pulmonologist if they think it makes sense.

## 2021-12-29 NOTE — ED Provider Triage Note (Signed)
Emergency Medicine Provider Triage Evaluation Note  Peggy Austin , a 51 y.o. female  was evaluated in triage.  Pt complains of shortness of breath.  He was sent here by her doctor for CTA chest to rule out pulmonary embolism.. She has reportedly had a recent case of pneumonia and she has been on doxycycline which she is still taking.  She just finished her Medrol Dosepak and has been using albuterol inhalers.  She does not feel like she is gotten any better.  About 3 days ago she started noticing chest pain.  Chest pain is worse with exertion.  Still has associated shortness of breath.  She feels that her legs are slightly more swollen than normal.  She denies any fevers or chills.  Review of Systems  Positive:  Negative:   Physical Exam  BP 102/89 (BP Location: Right Arm)   Pulse 82   Temp 98.2 F (36.8 C) (Oral)   Resp 18   Ht '5\' 2"'$  (1.575 m)   Wt 86.2 kg   SpO2 100%   BMI 34.75 kg/m  Gen:   Awake, no distress   Resp:  Normal effort  MSK:   Moves extremities without difficulty  Other:  Non pitting edema. No calf tenderness.    Medical Decision Making  Medically screening exam initiated at 8:09 PM.  Appropriate orders placed.  Peggy Austin was informed that the remainder of the evaluation will be completed by another provider, this initial triage assessment does not replace that evaluation, and the importance of remaining in the ED until their evaluation is complete.     Peggy Birchwood, PA-C 12/29/21 2010

## 2021-12-29 NOTE — ED Provider Notes (Signed)
[facility] DEPT Provider Note   CSN: [id number] Arrival date & time: 12/29/21  1933     History  Chief Complaint  Patient presents with   Shortness of Breath    Peggy Austin is a 51 y.o. female.  51 yo F with a chief complaint of difficulty breathing cough and confusion.  The patient tells me that she was diagnosed with pneumonia and she has been taking antibiotics for this.  She tells me that someone told her that she needed to have a CT scan to be evaluated for pulmonary embolism but she has trouble describing exactly who it was.  Initially she told me that she was told if she had ongoing difficulty breathing then she ought to have a reCT scan.  She feels like she has been having ongoing issues since she had pneumonia and has not really gotten much better.  Currently is on antibiotics.  She is wondering if she can get another dose of steroids.   Shortness of Breath      Home Medications Prior to Admission medications   Medication Sig Start Date End Date Taking? Authorizing Provider  acetaminophen (TYLENOL) 500 MG tablet Take 1 tablet (500 mg total) by mouth every 6 (six) hours as needed. Patient taking differently: Take 500 mg by mouth every 6 (six) hours as needed for mild pain.  06/11/15   Waynetta Pean, PA-C  albuterol (PROVENTIL HFA;VENTOLIN HFA) 108 (90 Base) MCG/ACT inhaler Inhale 2 puffs into the lungs every 4 (four) hours as needed for wheezing or shortness of breath. 07/17/16   Jacqulyn Cane, MD  carbamazepine (TEGRETOL) 100 MG chewable tablet Chew 200-300 mg by mouth See admin instructions. '200mg'$  in the morning and '300mg'$  at bedtime 01/23/18   [provider]  cetirizine (ZYRTEC ALLERGY) 10 MG tablet Take 1 tablet (10 mg total) by mouth daily. Patient taking differently: Take 10 mg by mouth daily as needed for allergies.  06/11/15   Waynetta Pean, PA-C  clindamycin (CLEOCIN) 300 MG capsule Take 1 capsule (300 mg total) by mouth 4  (four) times daily. X 7 days 03/23/18   Noe Gens, PA-C  DULoxetine (CYMBALTA) 60 MG capsule Take 1 capsule (60 mg total) by mouth daily. Patient not taking: Reported on 08/30/2016 04/12/15   Derrill Center, NP  esomeprazole (NEXIUM) 40 MG capsule Take 40 mg by mouth daily at 12 noon.    [provider]  gabapentin (NEURONTIN) 300 MG capsule Take 1 capsule (300 mg total) by mouth 3 (three) times daily. Take '300mg'$  in the morning, '300mg'$  at 3pm, and '600mg'$  at night Patient not taking: Reported on 01/24/2018 04/13/15   Patrecia Pour, NP  levothyroxine (SYNTHROID, LEVOTHROID) 25 MCG tablet Take 25 mcg by mouth daily before breakfast.     [provider]  magic mouthwash w/lidocaine SOLN Take 5 mLs by mouth 3 (three) times daily as needed for mouth pain. Swish and gargle for 30-60 seconds, then spit 03/23/18   Noe Gens, PA-C  pantoprazole (PROTONIX) 20 MG tablet Take 1 tablet (20 mg total) by mouth daily. Patient not taking: Reported on 08/30/2016 04/12/15   Derrill Center, NP  promethazine (PHENERGAN) 25 MG tablet Take 25 mg by mouth every 6 (six) hours as needed for nausea or vomiting.    [provider]  ranitidine (ZANTAC) 150 MG tablet Take 300 mg by mouth 2 (two) times daily.    [provider]  risperiDONE (RISPERDAL) 1 MG tablet  Take 1 mg by mouth at bedtime.    [provider]  sucralfate (CARAFATE) 1 GM/10ML suspension Take 10 mLs (1 g total) by mouth 4 (four) times daily -  with meals and at bedtime. Patient not taking: Reported on 01/24/2018 06/26/16   Ashley Murrain, NP  traMADol (ULTRAM) 50 MG tablet Take 1 tablet (50 mg total) by mouth every 6 (six) hours as needed. 01/24/18   Maczis, Barth Kirks, PA-C  traZODone (DESYREL) 150 MG tablet Take 150 mg by mouth at bedtime as needed for sleep.     [provider]  citalopram (CELEXA) 20 MG tablet Take 20 mg by mouth daily.    08/22/11  [provider]  metFORMIN (GLUCOPHAGE) 500 MG  tablet Take 500 mg by mouth 2 (two) times daily with a meal.    08/22/11  [provider]      Allergies    Amoxicillin, Doxycycline, Morphine, Penicillins, Salmon [fish allergy], Avelox [moxifloxacin hcl in nacl], and Moxifloxacin    Review of Systems   Review of Systems  Respiratory:  Positive for shortness of breath.     Physical Exam Updated Vital Signs BP 116/85   Pulse 73   Temp 98.1 F (36.7 C) (Oral)   Resp 12   Ht '5\' 2"'$  (1.575 m)   Wt 86.2 kg   SpO2 100%   BMI 34.75 kg/m  Physical Exam Vitals and nursing note reviewed.  Constitutional:      General: She is not in acute distress.    Appearance: She is well-developed. She is not diaphoretic.     Comments: BMI 35  HENT:     Head: Normocephalic and atraumatic.  Eyes:     Pupils: Pupils are equal, round, and reactive to light.  Cardiovascular:     Rate and Rhythm: Normal rate and regular rhythm.     Heart sounds: No murmur heard.    No friction rub. No gallop.  Pulmonary:     Effort: Pulmonary effort is normal.     Breath sounds: No wheezing or rales.  Abdominal:     General: There is no distension.     Palpations: Abdomen is soft.     Tenderness: There is no abdominal tenderness.  Musculoskeletal:        General: No tenderness.     Cervical back: Normal range of motion and neck supple.  Skin:    General: Skin is warm and dry.  Neurological:     Mental Status: She is alert and oriented to person, place, and time.  Psychiatric:        Behavior: Behavior normal.     ED Results / Procedures / Treatments   Labs (all labs ordered are listed, but only abnormal results are displayed) Labs Reviewed  BASIC METABOLIC PANEL - Abnormal; Notable for the following components:      Result Value   Chloride 116 (*)    Glucose, Bld 110 (*)    All other components within normal limits  CBC WITH DIFFERENTIAL/PLATELET - Abnormal; Notable for the following components:   Hemoglobin 11.6 (*)    MCH 25.2 (*)     RDW 17.3 (*)    Platelets 460 (*)    All other components within normal limits  RESP PANEL BY RT-PCR (FLU A&B, COVID) ARPGX2  BRAIN NATRIURETIC PEPTIDE  TROPONIN I (HIGH SENSITIVITY)  TROPONIN I (HIGH SENSITIVITY)    EKG None  Radiology CT Angio Chest PE W and/or Wo Contrast  Result  Date: 12/29/2021 CLINICAL DATA:  Pulmonary embolism suspected. EXAM: CT ANGIOGRAPHY CHEST WITH CONTRAST TECHNIQUE: Multidetector CT imaging of the chest was performed using the standard protocol during bolus administration of intravenous contrast. Multiplanar CT image reconstructions and MIPs were obtained to evaluate the vascular anatomy. RADIATION DOSE REDUCTION: This exam was performed according to the departmental dose-optimization program which includes automated exposure control, adjustment of the mA and/or kV according to patient size and/or use of iterative reconstruction technique. CONTRAST:  49m OMNIPAQUE IOHEXOL 350 MG/ML SOLN COMPARISON:  Chest radiograph performed earlier on the same date. FINDINGS: Cardiovascular: Satisfactory opacification of the pulmonary arteries to the segmental level. No evidence of pulmonary embolism. Normal heart size. No pericardial effusion. Mediastinum/Nodes: No enlarged mediastinal, hilar, or axillary lymph nodes. Thyroid gland, trachea, and esophagus demonstrate no significant findings. There are ground-glass opacities in the bilateral lower lobes with areas of linear atelectasis. Lungs/Pleura: There are multiple small areas of ground-glass attenuation in the right upper and lower lobes as well as in the left lower lobe with linear atelectasis suggesting infectious/inflammatory process including pneumonia or resolving pneumonia. No large pleural effusion or focal consolidation. Upper Abdomen: Cholecystectomy changes. Small hiatal hernia. Postsurgical changes of the stomach suggesting prior bariatric surgery. Musculoskeletal: No chest wall abnormality. No acute or significant  osseous findings. Review of the MIP images confirms the above findings. IMPRESSION: 1.  No evidence of pulmonary embolism. 2. Multiple small foci of ground-glass attenuation in right upper and lower lobes with atelectasis in the right lower lobe, concerning for infectious/inflammatory process including pneumonia or resolving pneumonia. Clinical correlation is suggested. Electronically Signed   By: IKeane PoliceD.O.   On: 12/29/2021 21:36   DG Chest 2 View  Result Date: 12/29/2021 CLINICAL DATA:  Shortness of breath EXAM: CHEST - 2 VIEW COMPARISON:  Chest x-ray 04/16/2017 FINDINGS: The heart and mediastinal contours are within normal limits. No focal consolidation. No pulmonary edema. No pleural effusion. No pneumothorax. No acute osseous abnormality. IMPRESSION: No active cardiopulmonary disease. Electronically Signed   By: MIven FinnM.D.   On: 12/29/2021 20:10    Procedures Procedures    Medications Ordered in ED Medications  iohexol (OMNIPAQUE) 350 MG/ML injection 75 mL (75 mLs Intravenous Contrast Given 12/29/21 2119)    ED Course/ Medical Decision Making/ A&P                           Medical Decision Making Amount and/or Complexity of Data Reviewed Radiology: ordered.   51yo F with a chief complaints of difficulty breathing ever since being diagnosed with pneumonia.  She tells me that someone told her she needed a CAT scan of her chest but she has trouble describing exactly who it was.  On my record review she was actually seen earlier today at an outside facility and they had declined to order a CT scan and had discharged home.  She then checked in here.  Had a CT scan ordered through the [facility] process.  CT scan of the chest is negative for pulmonary embolism.  It does show residual lung disease, I think most likely this is signs of resolving pneumonia as her chest x-ray is also without focal infiltrate or pneumothorax.  I discussed the results with the patient.  We will have her  follow-up with her family doctor.  As they know her case better they may be able to decide if she has ongoing pneumonia needs further antibiotic therapy or if  repeat dosing of steroids would be beneficial.  Her troponin and BNP are negative.  No significant electrolyte abnormality.  No leukocytosis.  9:54 PM:  I have discussed the diagnosis/risks/treatment options with the patient and family.  Evaluation and diagnostic testing in the emergency department does not suggest an emergent condition requiring admission or immediate intervention beyond what has been performed at this time.  They will follow up with  PCP. We also discussed returning to the ED immediately if new or worsening sx occur. We discussed the sx which are most concerning (e.g., sudden worsening pain, fever, inability to tolerate by mouth) that necessitate immediate return. Medications administered to the patient during their visit and any new prescriptions provided to the patient are listed below.  Medications given during this visit Medications  iohexol (OMNIPAQUE) 350 MG/ML injection 75 mL (75 mLs Intravenous Contrast Given 12/29/21 2119)     The patient appears reasonably screen and/or stabilized for discharge and I doubt any other medical condition or other [facility] requiring further screening, evaluation, or treatment in the ED at this time prior to discharge.          Final Clinical Impression(s) / ED Diagnoses Final diagnoses:  SOB (shortness of breath)    Rx / DC Orders ED Discharge Orders     None         Deno Etienne, DO 12/29/21 2154

## 2021-12-29 NOTE — ED Triage Notes (Addendum)
Patient was sent here for a CT angiogram by her MD. She has had pneumonia and fluids in the lungs for 10 days. She complains of shortness of breath.

## 2022-01-06 ENCOUNTER — Telehealth (HOSPITAL_COMMUNITY): Payer: Self-pay | Admitting: Family Medicine

## 2022-02-21 ENCOUNTER — Telehealth (HOSPITAL_COMMUNITY): Payer: Self-pay

## 2022-02-21 NOTE — Telephone Encounter (Signed)
No response from pt regarding PR.   Closed referral.
